# Patient Record
Sex: Female | Born: 1935 | Race: White | Hispanic: No | Marital: Married | Smoking: Never smoker
Health system: Southern US, Community
[De-identification: ages and names within clinical notes are randomized; demographics above are authoritative.]

## PROBLEM LIST (undated history)

## (undated) DIAGNOSIS — M810 Age-related osteoporosis without current pathological fracture: Secondary | ICD-10-CM

## (undated) DIAGNOSIS — K5100 Ulcerative (chronic) pancolitis without complications: Secondary | ICD-10-CM

## (undated) DIAGNOSIS — I10 Essential (primary) hypertension: Secondary | ICD-10-CM

## (undated) DIAGNOSIS — I498 Other specified cardiac arrhythmias: Secondary | ICD-10-CM

## (undated) DIAGNOSIS — J189 Pneumonia, unspecified organism: Secondary | ICD-10-CM

## (undated) HISTORY — PX: OTHER SURGICAL HISTORY: SHX169

---

## 2008-01-29 ENCOUNTER — Ambulatory Visit (HOSPITAL_COMMUNITY): Admission: RE | Admit: 2008-01-29 | Discharge: 2008-01-29 | Payer: Self-pay | Admitting: Urology

## 2015-07-23 ENCOUNTER — Emergency Department (HOSPITAL_BASED_OUTPATIENT_CLINIC_OR_DEPARTMENT_OTHER)
Admission: EM | Admit: 2015-07-23 | Discharge: 2015-07-23 | Disposition: A | Discharge status: Home / Self Care | Payer: Medicare Other | Attending: Emergency Medicine | Admitting: Emergency Medicine

## 2015-07-23 ENCOUNTER — Encounter (HOSPITAL_BASED_OUTPATIENT_CLINIC_OR_DEPARTMENT_OTHER): Payer: Self-pay | Admitting: *Deleted

## 2015-07-23 DIAGNOSIS — Z79899 Other long term (current) drug therapy: Secondary | ICD-10-CM | POA: Diagnosis not present

## 2015-07-23 DIAGNOSIS — E871 Hypo-osmolality and hyponatremia: Secondary | ICD-10-CM | POA: Insufficient documentation

## 2015-07-23 DIAGNOSIS — R112 Nausea with vomiting, unspecified: Secondary | ICD-10-CM | POA: Diagnosis not present

## 2015-07-23 DIAGNOSIS — E876 Hypokalemia: Secondary | ICD-10-CM | POA: Diagnosis not present

## 2015-07-23 DIAGNOSIS — Z8739 Personal history of other diseases of the musculoskeletal system and connective tissue: Secondary | ICD-10-CM | POA: Insufficient documentation

## 2015-07-23 DIAGNOSIS — Z7982 Long term (current) use of aspirin: Secondary | ICD-10-CM | POA: Insufficient documentation

## 2015-07-23 DIAGNOSIS — I10 Essential (primary) hypertension: Secondary | ICD-10-CM | POA: Insufficient documentation

## 2015-07-23 DIAGNOSIS — K5900 Constipation, unspecified: Secondary | ICD-10-CM | POA: Insufficient documentation

## 2015-07-23 DIAGNOSIS — R531 Weakness: Secondary | ICD-10-CM | POA: Diagnosis present

## 2015-07-23 DIAGNOSIS — Z8701 Personal history of pneumonia (recurrent): Secondary | ICD-10-CM | POA: Diagnosis not present

## 2015-07-23 DIAGNOSIS — Z792 Long term (current) use of antibiotics: Secondary | ICD-10-CM | POA: Diagnosis not present

## 2015-07-23 DIAGNOSIS — E860 Dehydration: Secondary | ICD-10-CM | POA: Insufficient documentation

## 2015-07-23 HISTORY — DX: Age-related osteoporosis without current pathological fracture: M81.0

## 2015-07-23 HISTORY — DX: Essential (primary) hypertension: I10

## 2015-07-23 HISTORY — DX: Ulcerative (chronic) pancolitis without complications: K51.00

## 2015-07-23 HISTORY — DX: Other specified cardiac arrhythmias: I49.8

## 2015-07-23 HISTORY — DX: Pneumonia, unspecified organism: J18.9

## 2015-07-23 LAB — URINE MICROSCOPIC-ADD ON: RBC / HPF: NONE SEEN RBC/hpf (ref 0–5)

## 2015-07-23 LAB — URINALYSIS, ROUTINE W REFLEX MICROSCOPIC
BILIRUBIN URINE: NEGATIVE
GLUCOSE, UA: NEGATIVE mg/dL
HGB URINE DIPSTICK: NEGATIVE
Ketones, ur: NEGATIVE mg/dL
Nitrite: NEGATIVE
Protein, ur: NEGATIVE mg/dL
SPECIFIC GRAVITY, URINE: 1.014 (ref 1.005–1.030)
pH: 6.5 (ref 5.0–8.0)

## 2015-07-23 LAB — MAGNESIUM: MAGNESIUM: 1.7 mg/dL (ref 1.7–2.4)

## 2015-07-23 MED ORDER — POTASSIUM CHLORIDE CRYS ER 20 MEQ PO TBCR
40.0000 meq | EXTENDED_RELEASE_TABLET | Freq: Once | ORAL | Status: AC
  Administered 2015-07-23: 40 meq via ORAL
  Filled 2015-07-23: qty 2

## 2015-07-23 MED ORDER — POTASSIUM CHLORIDE 10 MEQ/100ML IV SOLN
10.0000 meq | Freq: Once | INTRAVENOUS | Status: AC
  Administered 2015-07-23: 10 meq via INTRAVENOUS
  Filled 2015-07-23: qty 100

## 2015-07-23 NOTE — ED Notes (Signed)
Pt states she feels "much better" 

## 2015-07-23 NOTE — ED Notes (Signed)
[facility]

## 2015-07-23 NOTE — ED Provider Notes (Signed)
CSN: [id number]   Arrival date & time 07/23/15 1503  History  By signing my name below, I, Bethel BornBritney McCollum, attest that this documentation has been prepared under the direction and in the presence of Tilden FossaElizabeth Jaydn Moscato, MD. Electronically Signed: Bethel BornBritney McCollum, ED Scribe. 07/23/2015. 3:51 PM.  Chief Complaint  Patient presents with  . Weakness    HPI The history is provided by the patient and a relative. No language interpreter was used.   [facility], Leatrice JewelsDorothy Eplin is a 81 y.o. female with history of HTN, a-fib, kidney disease, and neurogenic bladder who presents to the Emergency Department with her son and daughter complaining of nausea and vomiting with onset 10 days ago. Pt states that she has been being treated for a UTI for the last 2 months with several rounds of abx. Most recently, she finished doxycycline 3 days ago after a 10 day treatment. Since starting the medication the pt had nausea and vomiting with every meal. Associated symptoms include dizziness, weakness (son states that the pt was unable to stand yesterday), and constipation that she associates with not eating much over the last several days. Today she was [facility] where her lab work was abnormal (K+ 2.9 & Na 129, WBC 11.3) and she was sent to the ED. Pt self-catheterizes and is on daily abx to prevent UTIs. Her urine has been cloudy and dark but not malodorous. She has not been passing gas. Pt denies fever, diarrhea, and abdominal bloating.   Past Medical History  Diagnosis Date  . Pneumonia   . Hypertension   . Ulcerative (chronic) enterocolitis (HCC)   . Other specified cardiac arrhythmias   . Osteoporosis     Past Surgical History  Procedure Laterality Date  . Urinary ostomy      No family history on file.  Social History  Substance Use Topics  . Smoking status: Never Smoker   . Smokeless tobacco: Not on file  . Alcohol  Use: No     Review of Systems  Constitutional: Negative for fever.  Gastrointestinal: Positive for nausea, vomiting and constipation. Negative for diarrhea.  Neurological: Positive for weakness.  All other systems reviewed and are negative.  Home Medications   Prior to Admission medications   Medication Sig Start Date End Date Taking? Authorizing Provider  amLODipine-benazepril (LOTREL) 10-20 MG capsule Take 1 capsule by mouth daily.   Yes Historical Provider, MD  aspirin 81 MG chewable tablet Chew by mouth daily.   Yes Historical Provider, MD  atenolol (TENORMIN) 100 MG tablet Take 100 mg by mouth daily.   Yes Historical Provider, MD  cetirizine (ZYRTEC) 5 MG tablet Take 5 mg by mouth daily.   Yes Historical Provider, MD  ciprofloxacin (CIPRO) 100 MG tablet Take 100 mg by mouth 2 (two) times daily.   Yes Historical Provider, MD  ezetimibe-simvastatin (VYTORIN) 10-20 MG tablet Take 1 tablet by mouth daily.   Yes Historical Provider, MD  RABEprazole (ACIPHEX) 20 MG tablet Take 20 mg by mouth daily.   Yes Historical Provider, MD    Allergies  Phenergan; Erythromycin-sulfisoxazole; and Sulfa antibiotics  Triage Vitals: BP 144/82 mmHg  Pulse 79  Temp(Src) 97.5 F (36.4 C) (Oral)  Resp 20  SpO2 97%  Physical Exam  Constitutional: She is oriented to person, place, and time. She appears well-developed and well-nourished.  HENT:  Head: Normocephalic and atraumatic.  Cardiovascular: Normal rate and regular rhythm.  No murmur heard. Pulmonary/Chest: Effort normal and breath sounds normal. No respiratory distress.  Abdominal: Soft. There is no tenderness. There is no rebound and no guarding.  Musculoskeletal: She exhibits no edema or tenderness.  Neurological: She is alert and oriented to person, place, and time.  Skin: Skin is warm and dry.  Psychiatric: She has a normal mood and affect. Her behavior is normal.  Nursing note and vitals reviewed.   ED Course   Procedures   DIAGNOSTIC STUDIES: Oxygen Saturation is 97% on RA, normal by my interpretation.    COORDINATION OF CARE: 3:46 PM Discussed treatment plan which includes lab work, potassium supplementation, and EKG with pt at bedside and pt agreed to plan.  Labs Reviewed  URINALYSIS, ROUTINE W REFLEX MICROSCOPIC (NOT AT [facility]) - Abnormal; Notable for the following:    Leukocytes, UA TRACE (*)    All other components within normal limits  URINE MICROSCOPIC-ADD ON - Abnormal; Notable for the following:    Squamous Epithelial / LPF 0-5 (*)    Bacteria, UA FEW (*)    All other components within normal limits  URINE CULTURE  MAGNESIUM    Imaging Review No results found.  I personally reviewed and evaluated these images and lab results as a part of my medical decision-making.   EKG Interpretation  Date/Time:  Saturday July 23 2015 15:13:56 EST Ventricular Rate:  77 PR Interval:  168 QRS Duration: 100 QT Interval:  448 QTC Calculation: 506 R Axis:   -3 Text Interpretation:  Sinus rhythm with Premature supraventricular complexes Nonspecific ST and T wave abnormality Abnormal ECG Confirmed by Lincoln Brigham [id number]) on 07/23/2015 3:33:18 PM    MDM   Final diagnoses:  Dehydration  Hypokalemia  Hyponatremia   Patient referred to the emergency department for hypokalemia, hyponatremia on outpatient labs. She had 1 L IV fluid bolus prior to ED arrival. Her labs demonstrated a sodium of 129, potassium of 2.9. Normal creatinine and BUN. Provided with oral and IV potassium replacement. UA is not consistent with UTI would not continue antibiotics this time. Patient is feeling improved and tolerating oral fluids. Recommend outpatient follow-up for repeat of her electrolytes.   Please note that patient did not have hypoxia in the emergency department and SPO2 6% was documented and air.  I personally performed the services described in this documentation, which was scribed in my presence.  The recorded information has been reviewed and is accurate.    Tilden Fossa, MD 07/24/15 [phone]

## 2015-07-23 NOTE — ED Notes (Signed)
Pt presents by POV [facility] today, with c/o weakness, also report by RN at clinic revealed low K and low Na in lab work.

## 2015-07-23 NOTE — ED Notes (Signed)
Pt HIGH FALL RISK, sr x 2 up, callbell within reach, family at side, bed in lowest position

## 2015-07-23 NOTE — ED Notes (Signed)
DC instructions reviewed with daughter and patient, discussed medications given at this ED visit, also stressed importance of keeping MD appoint on Monday for follow up, daughter states has confirmed appointment with MD they saw today at [facility] office

## 2015-07-23 NOTE — ED Notes (Signed)
Pt placed in exam room 2, on stretcher, sr x 2 up, family at side

## 2015-07-23 NOTE — ED Notes (Signed)
[facility], informed this RN pts vital signs, that 22g IV in place, that 1 unit of NS was given along with 4mg  Zofran. Labs drawn, placed unofficial report on chart.

## 2015-07-23 NOTE — ED Notes (Signed)
Reported that K+ 2.9 & Na 129, WBC 11.3

## 2015-07-23 NOTE — Discharge Instructions (Signed)
Dehydration, Adult Dehydration is a condition in which you do not have enough fluid or water in your body. It happens when you take in less fluid than you lose. Vital organs such as the kidneys, brain, and heart cannot function without a proper amount of fluids. Any loss of fluids from the body can cause dehydration.  Dehydration can range from mild to severe. This condition should be treated right away to help prevent it from becoming severe. CAUSES  This condition may be caused by:  Vomiting.  Diarrhea.  Excessive sweating, such as when exercising in hot or humid weather.  Not drinking enough fluid during strenuous exercise or during an illness.  Excessive urine output.  Fever.  Certain medicines. RISK FACTORS This condition is more likely to develop in:  People who are taking certain medicines that cause the body to lose excess fluid (diuretics).   People who have a chronic illness, such as diabetes, that may increase urination.  Older adults.   People who live at high altitudes.   People who participate in endurance sports.  SYMPTOMS  Mild Dehydration  Thirst.  Dry lips.  Slightly dry mouth.  Dry, warm skin. Moderate Dehydration  Very dry mouth.   Muscle cramps.   Dark urine and decreased urine production.   Decreased tear production.   Headache.   Light-headedness, especially when you stand up from a sitting position.  Severe Dehydration  Changes in skin.   Cold and clammy skin.   Skin does not spring back quickly when lightly pinched and released.   Changes in body fluids.   Extreme thirst.   No tears.   Not able to sweat when body temperature is high, such as in hot weather.   Minimal urine production.   Changes in vital signs.   Rapid, weak pulse (more than 100 beats per minute when you are sitting still).   Rapid breathing.   Low blood pressure.   Other changes.   Sunken eyes.   Cold hands and feet.    Confusion.  Lethargy and difficulty being awakened.  Fainting (syncope).   Short-term weight loss.   Unconsciousness. DIAGNOSIS  This condition may be diagnosed based on your symptoms. You may also have tests to determine how severe your dehydration is. These tests may include:   Urine tests.   Blood tests.  TREATMENT  Treatment for this condition depends on the severity. Mild or moderate dehydration can often be treated at home. Treatment should be started right away. Do not wait until dehydration becomes severe. Severe dehydration needs to be treated at the hospital. Treatment for Mild Dehydration  Drinking plenty of water to replace the fluid you have lost.   Replacing minerals in your blood (electrolytes) that you may have lost.  Treatment for Moderate Dehydration  Consuming oral rehydration solution (ORS). Treatment for Severe Dehydration  Receiving fluid through an IV tube.   Receiving electrolyte solution through a feeding tube that is passed through your nose and into your stomach (nasogastric tube or NG tube).  Correcting any abnormalities in electrolytes. HOME CARE INSTRUCTIONS   Drink enough fluid to keep your urine clear or pale yellow.   Drink water or fluid slowly by taking small sips. You can also try sucking on ice cubes.  Have food or beverages that contain electrolytes. Examples include bananas and sports drinks.  Take over-the-counter and prescription medicines only as told by your health care provider.   Prepare ORS according to the manufacturer's instructions. Take sips   of ORS every 5 minutes until your urine returns to normal.  If you have vomiting or diarrhea, continue to try to drink water, ORS, or both.   If you have diarrhea, avoid:   Beverages that contain caffeine.   Fruit juice.   Milk.   Carbonated soft drinks.  Do not take salt tablets. This can lead to the condition of having too much sodium in your body  (hypernatremia).  SEEK MEDICAL CARE IF:  You cannot eat or drink without vomiting.  You have had moderate diarrhea during a period of more than 24 hours.  You have a fever. SEEK IMMEDIATE MEDICAL CARE IF:   You have extreme thirst.  You have severe diarrhea.  You have not urinated in 6-8 hours, or you have urinated only a small amount of very dark urine.  You have shriveled skin.  You are dizzy, confused, or both.   This information is not intended to replace advice given to you by your health care provider. Make sure you discuss any questions you have with your health care provider.   Document Released: 08/20/2005 Document Revised: 05/11/2015 Document Reviewed: 01/05/2015 Elsevier Interactive Patient Education 2016 Elsevier Inc.   Hypokalemia Hypokalemia means that the amount of potassium in the blood is lower than normal.Potassium is a chemical, called an electrolyte, that helps regulate the amount of fluid in the body. It also stimulates muscle contraction and helps nerves function properly.Most of the body's potassium is inside of cells, and only a very small amount is in the blood. Because the amount in the blood is so small, minor changes can be life-threatening. CAUSES  Antibiotics.  Diarrhea or vomiting.  Using laxatives too much, which can cause diarrhea.  Chronic kidney disease.  Water pills (diuretics).  Eating disorders (bulimia).  Low magnesium level.  Sweating a lot. SIGNS AND SYMPTOMS  Weakness.  Constipation.  Fatigue.  Muscle cramps.  Mental confusion.  Skipped heartbeats or irregular heartbeat (palpitations).  Tingling or numbness. DIAGNOSIS  Your health care provider can diagnose hypokalemia with blood tests. In addition to checking your potassium level, your health care provider may also check other lab tests. TREATMENT Hypokalemia can be treated with potassium supplements taken by mouth or adjustments in your current medicines.  If your potassium level is very low, you may need to get potassium through a vein (IV) and be monitored in the hospital. A diet high in potassium is also helpful. Foods high in potassium are:  Nuts, such as peanuts and pistachios.  Seeds, such as sunflower seeds and pumpkin seeds.  Peas, lentils, and lima beans.  Whole grain and bran cereals and breads.  Fresh fruit and vegetables, such as apricots, avocado, bananas, cantaloupe, kiwi, oranges, tomatoes, asparagus, and potatoes.  Orange and tomato juices.  Red meats.  Fruit yogurt. HOME CARE INSTRUCTIONS  Take all medicines as prescribed by your health care provider.  Maintain a healthy diet by including nutritious food, such as fruits, vegetables, nuts, whole grains, and lean meats.  If you are taking a laxative, be sure to follow the directions on the label. SEEK MEDICAL CARE IF:  Your weakness gets worse.  You feel your heart pounding or racing.  You are vomiting or having diarrhea.  You are diabetic and having trouble keeping your blood glucose in the normal range. SEEK IMMEDIATE MEDICAL CARE IF:  You have chest pain, shortness of breath, or dizziness.  You are vomiting or having diarrhea for more than 2 days.  You faint. MAKE   SURE YOU:   Understand these instructions.  Will watch your condition.  Will get help right away if you are not doing well or get worse.   This information is not intended to replace advice given to you by your health care provider. Make sure you discuss any questions you have with your health care provider.   Document Released: 08/20/2005 Document Revised: 09/10/2014 Document Reviewed: 02/20/2013 Elsevier Interactive Patient Education 2016 Elsevier Inc.  

## 2015-07-23 NOTE — ED Notes (Signed)
Placed on cont cardiac monitor with cont POX monitoring and NBP monitoring

## 2015-07-25 LAB — URINE CULTURE: Culture: NO GROWTH

## 2017-07-03 ENCOUNTER — Emergency Department (HOSPITAL_COMMUNITY): Payer: Medicare Other | Admitting: Anesthesiology

## 2017-07-03 ENCOUNTER — Encounter (HOSPITAL_COMMUNITY): Payer: Self-pay

## 2017-07-03 ENCOUNTER — Inpatient Hospital Stay (HOSPITAL_COMMUNITY)
Admission: EM | Admit: 2017-07-03 | Discharge: 2017-07-08 | DRG: 351 | Disposition: A | Discharge status: Home Health | Payer: Medicare Other | Attending: Surgery | Admitting: Surgery

## 2017-07-03 ENCOUNTER — Encounter (HOSPITAL_COMMUNITY): Admission: EM | Disposition: A | Discharge status: Home Health | Payer: Self-pay | Source: Home / Self Care

## 2017-07-03 ENCOUNTER — Inpatient Hospital Stay (HOSPITAL_COMMUNITY): Payer: Medicare Other

## 2017-07-03 ENCOUNTER — Emergency Department (HOSPITAL_COMMUNITY): Payer: Medicare Other

## 2017-07-03 DIAGNOSIS — K5100 Ulcerative (chronic) pancolitis without complications: Secondary | ICD-10-CM | POA: Diagnosis not present

## 2017-07-03 DIAGNOSIS — R7989 Other specified abnormal findings of blood chemistry: Secondary | ICD-10-CM | POA: Diagnosis not present

## 2017-07-03 DIAGNOSIS — Z9889 Other specified postprocedural states: Secondary | ICD-10-CM | POA: Diagnosis not present

## 2017-07-03 DIAGNOSIS — Z881 Allergy status to other antibiotic agents status: Secondary | ICD-10-CM | POA: Diagnosis not present

## 2017-07-03 DIAGNOSIS — Z8719 Personal history of other diseases of the digestive system: Secondary | ICD-10-CM

## 2017-07-03 DIAGNOSIS — K4130 Unilateral femoral hernia, with obstruction, without gangrene, not specified as recurrent: Secondary | ICD-10-CM

## 2017-07-03 DIAGNOSIS — E860 Dehydration: Secondary | ICD-10-CM | POA: Diagnosis present

## 2017-07-03 DIAGNOSIS — I959 Hypotension, unspecified: Secondary | ICD-10-CM | POA: Diagnosis not present

## 2017-07-03 DIAGNOSIS — R197 Diarrhea, unspecified: Secondary | ICD-10-CM

## 2017-07-03 DIAGNOSIS — I129 Hypertensive chronic kidney disease with stage 1 through stage 4 chronic kidney disease, or unspecified chronic kidney disease: Secondary | ICD-10-CM | POA: Diagnosis not present

## 2017-07-03 DIAGNOSIS — K460 Unspecified abdominal hernia with obstruction, without gangrene: Secondary | ICD-10-CM | POA: Diagnosis present

## 2017-07-03 DIAGNOSIS — R112 Nausea with vomiting, unspecified: Secondary | ICD-10-CM

## 2017-07-03 DIAGNOSIS — N319 Neuromuscular dysfunction of bladder, unspecified: Secondary | ICD-10-CM | POA: Diagnosis present

## 2017-07-03 DIAGNOSIS — N179 Acute kidney failure, unspecified: Secondary | ICD-10-CM

## 2017-07-03 DIAGNOSIS — E876 Hypokalemia: Secondary | ICD-10-CM

## 2017-07-03 DIAGNOSIS — I480 Paroxysmal atrial fibrillation: Secondary | ICD-10-CM | POA: Diagnosis not present

## 2017-07-03 DIAGNOSIS — Z882 Allergy status to sulfonamides status: Secondary | ICD-10-CM

## 2017-07-03 DIAGNOSIS — E871 Hypo-osmolality and hyponatremia: Secondary | ICD-10-CM | POA: Diagnosis not present

## 2017-07-03 DIAGNOSIS — I2510 Atherosclerotic heart disease of native coronary artery without angina pectoris: Secondary | ICD-10-CM | POA: Diagnosis not present

## 2017-07-03 DIAGNOSIS — R7303 Prediabetes: Secondary | ICD-10-CM | POA: Diagnosis present

## 2017-07-03 DIAGNOSIS — Z87440 Personal history of urinary (tract) infections: Secondary | ICD-10-CM

## 2017-07-03 DIAGNOSIS — Z9181 History of falling: Secondary | ICD-10-CM

## 2017-07-03 DIAGNOSIS — M810 Age-related osteoporosis without current pathological fracture: Secondary | ICD-10-CM | POA: Diagnosis not present

## 2017-07-03 DIAGNOSIS — E878 Other disorders of electrolyte and fluid balance, not elsewhere classified: Secondary | ICD-10-CM | POA: Diagnosis present

## 2017-07-03 DIAGNOSIS — Z888 Allergy status to other drugs, medicaments and biological substances status: Secondary | ICD-10-CM

## 2017-07-03 DIAGNOSIS — Z87891 Personal history of nicotine dependence: Secondary | ICD-10-CM

## 2017-07-03 DIAGNOSIS — A0472 Enterocolitis due to Clostridium difficile, not specified as recurrent: Secondary | ICD-10-CM | POA: Diagnosis present

## 2017-07-03 DIAGNOSIS — D649 Anemia, unspecified: Secondary | ICD-10-CM | POA: Diagnosis not present

## 2017-07-03 DIAGNOSIS — E785 Hyperlipidemia, unspecified: Secondary | ICD-10-CM | POA: Diagnosis present

## 2017-07-03 DIAGNOSIS — N184 Chronic kidney disease, stage 4 (severe): Secondary | ICD-10-CM

## 2017-07-03 HISTORY — DX: Unilateral femoral hernia, with obstruction, without gangrene, not specified as recurrent: K41.30

## 2017-07-03 HISTORY — PX: FEMORAL HERNIA REPAIR: SHX632

## 2017-07-03 LAB — URINALYSIS, ROUTINE W REFLEX MICROSCOPIC
BILIRUBIN URINE: NEGATIVE
GLUCOSE, UA: NEGATIVE mg/dL
Hgb urine dipstick: NEGATIVE
KETONES UR: 5 mg/dL — AB
NITRITE: NEGATIVE
Protein, ur: 100 mg/dL — AB
Specific Gravity, Urine: 1.017 (ref 1.005–1.030)
pH: 5 (ref 5.0–8.0)

## 2017-07-03 LAB — CBC WITH DIFFERENTIAL/PLATELET
BASOS PCT: 0 %
Basophils Absolute: 0 10*3/uL (ref 0.0–0.1)
EOS ABS: 0 10*3/uL (ref 0.0–0.7)
Eosinophils Relative: 0 %
HEMATOCRIT: 40.6 % (ref 36.0–46.0)
Hemoglobin: 14.3 g/dL (ref 12.0–15.0)
Lymphocytes Relative: 13 %
Lymphs Abs: 1 10*3/uL (ref 0.7–4.0)
MCH: 31 pg (ref 26.0–34.0)
MCHC: 35.2 g/dL (ref 30.0–36.0)
MCV: 87.9 fL (ref 78.0–100.0)
MONO ABS: 0.6 10*3/uL (ref 0.1–1.0)
MONOS PCT: 8 %
Neutro Abs: 6.3 10*3/uL (ref 1.7–7.7)
Neutrophils Relative %: 79 %
Platelets: 292 10*3/uL (ref 150–400)
RBC: 4.62 MIL/uL (ref 3.87–5.11)
RDW: 14 % (ref 11.5–15.5)
WBC: 7.9 10*3/uL (ref 4.0–10.5)

## 2017-07-03 LAB — COMPREHENSIVE METABOLIC PANEL
ALBUMIN: 4.3 g/dL (ref 3.5–5.0)
ALK PHOS: 59 U/L (ref 38–126)
ALT: 21 U/L (ref 14–54)
AST: 40 U/L (ref 15–41)
Anion gap: 18 — ABNORMAL HIGH (ref 5–15)
BUN: 25 mg/dL — ABNORMAL HIGH (ref 6–20)
CALCIUM: 11 mg/dL — AB (ref 8.9–10.3)
CO2: 36 mmol/L — AB (ref 22–32)
CREATININE: 1.99 mg/dL — AB (ref 0.44–1.00)
Chloride: 75 mmol/L — ABNORMAL LOW (ref 101–111)
GFR calc Af Amer: 24 mL/min — ABNORMAL LOW (ref >60)
GFR calc non Af Amer: 21 mL/min — ABNORMAL LOW (ref >60)
GLUCOSE: 127 mg/dL — AB (ref 65–99)
Potassium: 2.9 mmol/L — ABNORMAL LOW (ref 3.5–5.1)
SODIUM: 129 mmol/L — AB (ref 135–145)
Total Bilirubin: 1.3 mg/dL — ABNORMAL HIGH (ref 0.3–1.2)
Total Protein: 8.4 g/dL — ABNORMAL HIGH (ref 6.5–8.1)

## 2017-07-03 LAB — I-STAT CG4 LACTIC ACID, ED
Lactic Acid, Venous: 2.36 mmol/L (ref 0.5–1.9)
Lactic Acid, Venous: 1.67 mmol/L (ref 0.5–1.9)

## 2017-07-03 LAB — MRSA PCR SCREENING: MRSA BY PCR: POSITIVE — AB

## 2017-07-03 SURGERY — HERNIA REPAIR FEMORAL
Anesthesia: General | Site: Groin | Laterality: Left

## 2017-07-03 MED ORDER — MESALAMINE 400 MG PO CPDR
800.0000 mg | DELAYED_RELEASE_CAPSULE | Freq: Three times a day (TID) | ORAL | Status: DC
  Administered 2017-07-03 – 2017-07-08 (×11): 800 mg via ORAL
  Filled 2017-07-03 (×15): qty 2

## 2017-07-03 MED ORDER — CHLORHEXIDINE GLUCONATE CLOTH 2 % EX PADS
6.0000 | MEDICATED_PAD | Freq: Once | CUTANEOUS | Status: AC
  Administered 2017-07-03: 6 via TOPICAL

## 2017-07-03 MED ORDER — LIDOCAINE HCL 2 % EX GEL
1.0000 "application " | Freq: Once | CUTANEOUS | Status: AC
  Administered 2017-07-03: 1 via TOPICAL
  Filled 2017-07-03: qty 20

## 2017-07-03 MED ORDER — DEXAMETHASONE SODIUM PHOSPHATE 4 MG/ML IJ SOLN
INTRAMUSCULAR | Status: DC | PRN
  Administered 2017-07-03: 10 mg via INTRAVENOUS

## 2017-07-03 MED ORDER — BUPIVACAINE-EPINEPHRINE 0.5% -1:200000 IJ SOLN
INTRAMUSCULAR | Status: DC | PRN
  Administered 2017-07-03: 20 mL

## 2017-07-03 MED ORDER — LIDOCAINE HCL (CARDIAC) 20 MG/ML IV SOLN
INTRAVENOUS | Status: DC | PRN
  Administered 2017-07-03: 80 mg via INTRAVENOUS

## 2017-07-03 MED ORDER — MORPHINE SULFATE (PF) 4 MG/ML IV SOLN: 1.0000 mg | INTRAVENOUS | Status: DC | PRN

## 2017-07-03 MED ORDER — METRONIDAZOLE 500 MG PO TABS
500.0000 mg | ORAL_TABLET | Freq: Three times a day (TID) | ORAL | Status: DC
  Administered 2017-07-03 – 2017-07-08 (×11): 500 mg via ORAL
  Filled 2017-07-03 (×14): qty 1

## 2017-07-03 MED ORDER — KCL IN DEXTROSE-NACL 40-5-0.9 MEQ/L-%-% IV SOLN
INTRAVENOUS | Status: DC
  Administered 2017-07-03 – 2017-07-05 (×5): via INTRAVENOUS
  Filled 2017-07-03 (×7): qty 1000

## 2017-07-03 MED ORDER — PHENYLEPHRINE HCL 10 MG/ML IJ SOLN
INTRAMUSCULAR | Status: DC | PRN
  Administered 2017-07-03 (×13): 80 ug via INTRAVENOUS

## 2017-07-03 MED ORDER — SODIUM CHLORIDE 0.9 % IV BOLUS (SEPSIS)
1000.0000 mL | Freq: Once | INTRAVENOUS | Status: AC
  Administered 2017-07-03: 1000 mL via INTRAVENOUS

## 2017-07-03 MED ORDER — FENTANYL CITRATE (PF) 100 MCG/2ML IJ SOLN
25.0000 ug | Freq: Once | INTRAMUSCULAR | Status: AC
  Administered 2017-07-03: 25 ug via INTRAVENOUS
  Filled 2017-07-03: qty 2

## 2017-07-03 MED ORDER — CEFOTETAN DISODIUM-DEXTROSE 2-2.08 GM-%(50ML) IV SOLR
2.0000 g | INTRAVENOUS | Status: DC
  Filled 2017-07-03: qty 50

## 2017-07-03 MED ORDER — ONDANSETRON 4 MG PO TBDP: 4.0000 mg | ORAL_TABLET | Freq: Four times a day (QID) | ORAL | Status: DC | PRN

## 2017-07-03 MED ORDER — ATENOLOL 50 MG PO TABS
100.0000 mg | ORAL_TABLET | Freq: Two times a day (BID) | ORAL | Status: DC
  Administered 2017-07-03 – 2017-07-08 (×9): 100 mg via ORAL
  Filled 2017-07-03 (×9): qty 2

## 2017-07-03 MED ORDER — LACTATED RINGERS IV SOLN
INTRAVENOUS | Status: DC
  Administered 2017-07-03 (×2): via INTRAVENOUS

## 2017-07-03 MED ORDER — PROPOFOL 500 MG/50ML IV EMUL
INTRAVENOUS | Status: DC | PRN
  Administered 2017-07-03: 20 ug/kg/min via INTRAVENOUS

## 2017-07-03 MED ORDER — BUPIVACAINE HCL (PF) 0.5 % IJ SOLN
INTRAMUSCULAR | Status: AC
  Filled 2017-07-03: qty 30

## 2017-07-03 MED ORDER — ONDANSETRON HCL 4 MG/2ML IJ SOLN: 4.0000 mg | Freq: Four times a day (QID) | INTRAMUSCULAR | Status: DC | PRN

## 2017-07-03 MED ORDER — IOPAMIDOL (ISOVUE-300) INJECTION 61%
INTRAVENOUS | Status: AC
  Filled 2017-07-03: qty 30

## 2017-07-03 MED ORDER — 0.9 % SODIUM CHLORIDE (POUR BTL) OPTIME
TOPICAL | Status: DC | PRN
  Administered 2017-07-03: 1000 mL

## 2017-07-03 MED ORDER — ALBUMIN HUMAN 5 % IV SOLN
INTRAVENOUS | Status: DC | PRN
  Administered 2017-07-03: 17:00:00 via INTRAVENOUS

## 2017-07-03 MED ORDER — FENTANYL CITRATE (PF) 100 MCG/2ML IJ SOLN
INTRAMUSCULAR | Status: DC | PRN
  Administered 2017-07-03: 50 ug via INTRAVENOUS
  Administered 2017-07-03: 100 ug via INTRAVENOUS

## 2017-07-03 MED ORDER — ONDANSETRON HCL 4 MG/2ML IJ SOLN
4.0000 mg | Freq: Once | INTRAMUSCULAR | Status: AC
  Administered 2017-07-03: 4 mg via INTRAVENOUS
  Filled 2017-07-03: qty 2

## 2017-07-03 MED ORDER — METOCLOPRAMIDE HCL 5 MG/ML IJ SOLN
10.0000 mg | Freq: Once | INTRAMUSCULAR | Status: AC
  Administered 2017-07-03: 10 mg via INTRAVENOUS
  Filled 2017-07-03: qty 2

## 2017-07-03 MED ORDER — AMLODIPINE BESYLATE 5 MG PO TABS
5.0000 mg | ORAL_TABLET | Freq: Every day | ORAL | Status: DC
Start: 2017-07-04 — End: 2017-07-08
  Administered 2017-07-05 – 2017-07-08 (×4): 5 mg via ORAL
  Filled 2017-07-03 (×4): qty 1

## 2017-07-03 MED ORDER — FENTANYL CITRATE (PF) 250 MCG/5ML IJ SOLN
INTRAMUSCULAR | Status: AC
  Filled 2017-07-03: qty 5

## 2017-07-03 MED ORDER — ONDANSETRON HCL 4 MG/2ML IJ SOLN
INTRAMUSCULAR | Status: DC | PRN
  Administered 2017-07-03: 4 mg via INTRAVENOUS

## 2017-07-03 MED ORDER — SUCCINYLCHOLINE CHLORIDE 20 MG/ML IJ SOLN
INTRAMUSCULAR | Status: DC | PRN
  Administered 2017-07-03: 120 mg via INTRAVENOUS

## 2017-07-03 MED ORDER — CEFOTETAN DISODIUM-DEXTROSE 2-2.08 GM-%(50ML) IV SOLR
2.0000 g | INTRAVENOUS | Status: AC
  Administered 2017-07-03: 2 g via INTRAVENOUS

## 2017-07-03 MED ORDER — ENOXAPARIN SODIUM 30 MG/0.3ML ~~LOC~~ SOLN
30.0000 mg | SUBCUTANEOUS | Status: DC
  Administered 2017-07-04 – 2017-07-05 (×2): 30 mg via SUBCUTANEOUS
  Filled 2017-07-03 (×2): qty 0.3

## 2017-07-03 MED ORDER — PROPOFOL 10 MG/ML IV BOLUS
INTRAVENOUS | Status: AC
  Filled 2017-07-03: qty 20

## 2017-07-03 MED ORDER — PROPOFOL 10 MG/ML IV BOLUS
INTRAVENOUS | Status: DC | PRN
  Administered 2017-07-03: 100 mg via INTRAVENOUS

## 2017-07-03 MED ORDER — POTASSIUM CHLORIDE 10 MEQ/100ML IV SOLN
10.0000 meq | Freq: Once | INTRAVENOUS | Status: AC
  Administered 2017-07-03: 10 meq via INTRAVENOUS
  Filled 2017-07-03: qty 100

## 2017-07-03 SURGICAL SUPPLY — 42 items
BLADE CLIPPER SURG (BLADE) IMPLANT
CANISTER SUCTION 2500CC (MISCELLANEOUS) ×3 IMPLANT
CHLORAPREP W/TINT 26ML (MISCELLANEOUS) ×3 IMPLANT
COVER SURGICAL LIGHT HANDLE (MISCELLANEOUS) ×3 IMPLANT
DERMABOND ADVANCED (GAUZE/BANDAGES/DRESSINGS) ×2
DERMABOND ADVANCED .7 DNX12 (GAUZE/BANDAGES/DRESSINGS) ×1 IMPLANT
DRAIN PENROSE 1/2X12 LTX STRL (WOUND CARE) IMPLANT
DRAPE LAPAROTOMY TRNSV 102X78 (DRAPE) ×3 IMPLANT
DRAPE UTILITY XL STRL (DRAPES) ×3 IMPLANT
DRSG TEGADERM 4X4.75 (GAUZE/BANDAGES/DRESSINGS) IMPLANT
DRSG TELFA 3X8 NADH (GAUZE/BANDAGES/DRESSINGS) IMPLANT
ELECT CAUTERY BLADE 6.4 (BLADE) ×3 IMPLANT
ELECT REM PT RETURN 9FT ADLT (ELECTROSURGICAL) ×3
ELECTRODE REM PT RTRN 9FT ADLT (ELECTROSURGICAL) ×1 IMPLANT
GLOVE BIO SURGEON STRL SZ7 (GLOVE) ×6 IMPLANT
GLOVE INDICATOR 7.0 STRL GRN (GLOVE) ×6 IMPLANT
GLOVE SURG SIGNA 7.5 PF LTX (GLOVE) ×3 IMPLANT
GOWN STRL REUS W/ TWL LRG LVL3 (GOWN DISPOSABLE) ×2 IMPLANT
GOWN STRL REUS W/ TWL XL LVL3 (GOWN DISPOSABLE) ×1 IMPLANT
GOWN STRL REUS W/TWL LRG LVL3 (GOWN DISPOSABLE) ×4
GOWN STRL REUS W/TWL XL LVL3 (GOWN DISPOSABLE) ×2
KIT BASIN OR (CUSTOM PROCEDURE TRAY) ×3 IMPLANT
KIT ROOM TURNOVER OR (KITS) ×3 IMPLANT
NEEDLE HYPO 25GX1X1/2 BEV (NEEDLE) ×6 IMPLANT
NS IRRIG 1000ML POUR BTL (IV SOLUTION) ×3 IMPLANT
PACK SURGICAL SETUP 50X90 (CUSTOM PROCEDURE TRAY) ×3 IMPLANT
PAD ARMBOARD 7.5X6 YLW CONV (MISCELLANEOUS) ×6 IMPLANT
PENCIL BUTTON HOLSTER BLD 10FT (ELECTRODE) ×3 IMPLANT
SPECIMEN JAR SMALL (MISCELLANEOUS) IMPLANT
SPONGE LAP 18X18 X RAY DECT (DISPOSABLE) ×3 IMPLANT
SUT MON AB 4-0 PC3 18 (SUTURE) ×3 IMPLANT
SUT SILK 2 0 SH (SUTURE) ×3 IMPLANT
SUT SILK 3 0 SH CR/8 (SUTURE) ×3 IMPLANT
SUT VIC AB 2-0 CT1 27 (SUTURE) ×4
SUT VIC AB 2-0 CT1 TAPERPNT 27 (SUTURE) ×2 IMPLANT
SUT VIC AB 3-0 CT1 27 (SUTURE) ×2
SUT VIC AB 3-0 CT1 TAPERPNT 27 (SUTURE) ×1 IMPLANT
SYR CONTROL 10ML LL (SYRINGE) ×3 IMPLANT
TOWEL OR 17X24 6PK STRL BLUE (TOWEL DISPOSABLE) ×3 IMPLANT
TOWEL OR 17X26 10 PK STRL BLUE (TOWEL DISPOSABLE) IMPLANT
TRAY FOLEY CATH SILVER 16FR (SET/KITS/TRAYS/PACK) ×3 IMPLANT
WATER STERILE IRR 1000ML POUR (IV SOLUTION) IMPLANT

## 2017-07-03 NOTE — Progress Notes (Signed)
Patient ID: Renee Fritz, female   DOB: 01-19-1926, 83 y.o.   MRN: [id number]   This is a patient with an incarcerated left femoral hernia.  It is creating a bowel obstruction.  Emergent left femoral hernia repair is recommended.  I have discussed this with the patient and her daughter.  I discussed the risk of surgery which includes but is not limited to bleeding, infection, injury to surrounding structures, the need for a bowel resection if the small bowel is compromised, chronic pain, hernia recurrence, use of mesh, cardiopulmonary issues, etc.  They understand and agree to proceed with surgery

## 2017-07-03 NOTE — ED Triage Notes (Signed)
Pt presents for evaluation of ongoing N/V x 3 days. Pt reports hx of ulcerative colitis and started on vancomycin. States she believes it may be causing her vomiting. States feels dehydrated. Pt reports mechanical fall onto buttocks this AM. Denies LOC/Dizziness/Lightheadedness.

## 2017-07-03 NOTE — Transfer of Care (Signed)
Immediate Anesthesia Transfer of Care Note  Patient: Renee JewelsDorothy Sabree  Procedure(s) Performed: HERNIA REPAIR FEMORAL (Left Groin)  Patient Location: PACU  Anesthesia Type:General  Level of Consciousness: awake and alert   Airway & Oxygen Therapy: Patient Spontanous Breathing and Patient connected to nasal cannula oxygen  Post-op Assessment: Report given to RN, Post -op Vital signs reviewed and stable and Patient moving all extremities  Post vital signs: Reviewed and stable  Last Vitals:  Vitals:   07/03/17 1445 07/03/17 1716  BP: 126/75 (!) 114/54  Pulse: 82 78  Resp: 19 13  Temp:  (!) 36.3 C  SpO2: 98% 100%    Last Pain:  Vitals:   07/03/17 1716  PainSc: 0-No pain         Complications: No apparent anesthesia complications

## 2017-07-03 NOTE — ED Notes (Signed)
On way to CT 

## 2017-07-03 NOTE — Anesthesia Procedure Notes (Signed)
Procedure Name: Intubation Date/Time: 07/03/2017 4:07 PM Performed by: Reine JustFLOWERS, Kalenna Millett T Pre-anesthesia Checklist: Patient identified, Emergency Drugs available, Suction available and Patient being monitored Patient Re-evaluated:Patient Re-evaluated prior to induction Oxygen Delivery Method: Circle system utilized Preoxygenation: Pre-oxygenation with 100% oxygen Induction Type: IV induction Ventilation: Mask ventilation without difficulty Laryngoscope Size: Miller and 2 Grade View: Grade I Tube type: Oral Tube size: 7.5 mm Number of attempts: 1 Airway Equipment and Method: Patient positioned with wedge pillow and Stylet Placement Confirmation: ETT inserted through vocal cords under direct vision,  positive ETCO2 and breath sounds checked- equal and bilateral Secured at: 21 cm Tube secured with: Tape Dental Injury: Teeth and Oropharynx as per pre-operative assessment

## 2017-07-03 NOTE — ED Notes (Signed)
Per Dr. Magnus IvanBlackman, NG tube will be placed in the OR when patient is asleep.

## 2017-07-03 NOTE — Op Note (Signed)
HERNIA REPAIR FEMORAL  Procedure Note  Renee JewelsDorothy Fritz 07/03/2017   Pre-op Diagnosis: incarcerated left hernia     Post-op Diagnosis: same  Procedure(s): HERNIA REPAIR FEMORAL  Surgeon(s): Abigail MiyamotoBlackman, Jenaya Saar, MD  Anesthesia: General  Staff:  Circulator: Jasper RilingHooper, Barbara, RN Scrub Person: Carmela RimaLeggio, Rebecca K  Estimated Blood Loss: Minimal               Indications: This is an 83 year old female with multiple medical problems who was recently being treated for possible C. difficile colitis.  She presented with nausea vomiting and abdominal pain over 3 days.  She had a CT scan today which showed an incarcerated left femoral hernia containing small bowel with a small bowel obstruction.  The decision was made to proceed emergently to the operating room  Findings: The patient was found to have an incarcerated left femoral hernia containing small intestines.  There were mild ischemic changes and bruising of the small bowel but no gross infarction so I elected not to resect the bowel.  The femoral hernia was repaired primarily  Procedure: The patient was brought to the operating room and identified as the correct patient.  She was placed supine on the operating room table and general anesthesia was induced.  Her abdomen was then prepped and draped in the usual sterile fashion.  I made a longitudinal incision in the patient's left lower quadrant with a scalpel.  I took this down through Scarpa's fascia with electrocautery.  The hernial was below the inguinal ligament.  I opened up the external oblique fascia above the inguinal ligament and then dissected toward the femoral hernia.  I ended up having to open up the inguinal ligament and then opened up the hernia sac.  There was visible small bowel in the hernia sac.  There were some ischemic changes in the wall of the bowel but no gross infarction.  I have eviscerated small bowel proximal distal to this area.  I elected to imbricate the small area of the  antimesenteric border of the small bowel with several interrupted silk sutures.  There was good peristalsis of the bowel.  The mesentery looked intact.  I then placed the bowel back into the abdominal cavity.  I then placed a 2-0 silk pursestring suture around the base of the hernia sac at the femoral opening and excised the redundant sac.  I then repaired the inguinal ligament with several mattress 2-0 Vicryl sutures and used several interrupted 2-0 Vicryl sutures to close up the femoral canal repairing the defect.  The femoral vessels and nerve were closely visualized and were not involved in the repair.  At this point I then closed the external oblique fascia as well with interrupted Vicryl sutures.  I then anesthetized the surrounding tissue with Marcaine.  I closed Scarpa's fascia with interrupted 3-0 Vicryl sutures and closed the skin with a running 4-0 Monocryl.  Dermabond was then applied.  The patient appeared to tolerate the procedure well.  All the counts were correct at the end of the procedure.  The patient was then taken in a stable condition to the recovery room.          Renee Fritz A   Date: 07/03/2017  Time: 5:09 PM

## 2017-07-03 NOTE — Progress Notes (Signed)
Patient stated she last took Atenolol yesterday. Anesthesiologist at bedside and stated to hold off on med at this time due to nausea/vomiting.

## 2017-07-03 NOTE — Plan of Care (Signed)
Problem: Education: Goal: Knowledge of [facility] [place] [facility] Education information/materials will improve Outcome: Progressing Discussed plan of care for the evening and NG tube with some teach back displayed

## 2017-07-03 NOTE — H&P (Addendum)
History and Physical  Renee Fritz ZOX:[id number] DOB: 26-Jul-1926 DOA: 07/03/2017  Referring physician: Dr. Magnus Ivan PCP: Lester ., MD  Outpatient Specialists: Surgery Dr. Magnus Ivan Patient coming from: Home  Chief Complaint: Post incarderated hernia repair, POD#0  HPI: Renee Fritz is a 83 y.o. female with medical history significant for ulcerative colitis, HTN, CAD, recently treated c-difficile colitis, left femorl hernia with incarcerated small bowel, POD#0 post  Incarcerated left hernia femoral repair. The patient reports intractable abdominal pain, nausea vomiting and diarrhea of 3 day duration. The medicine was asked to evaluate and admit the patient pos surgery due to her multiple comorbidities.  ED Course: Upon presentation to the ED, CT abd/pelvis w/o contrast revealed:1. Left femoral hernia containing incarcerated small bowel with resultant high-grade obstruction. Abnormal chemistry panel with hyponatremia, hypokalemia, anion gap of 18, and AKI on CKD stage 4. General surgery consulted and patient was taken to the OR for femoral hernia repair, POD#0   Review of Systems: Pt denies any chest pain, dyspnea or palpitations.  Pain is well controlled on current pain management. Review of systems are otherwise negative.   Past Medical History:  Diagnosis Date  . Hypertension   . Osteoporosis   . Other specified cardiac arrhythmias   . Pneumonia   . Ulcerative (chronic) enterocolitis (HCC)    Past Surgical History:  Procedure Laterality Date  . urinary ostomy      Social History:  reports that she has never smoked. She does not have any smokeless tobacco history on file. She reports that she does not drink alcohol or use drugs.   Allergies  Allergen Reactions  . Phenergan [Promethazine Hcl] Other (See Comments)    Feels like bugs crawling all over  . Vancomycin Diarrhea and Other (See Comments)    Stomach pains  . Erythromycin-Sulfisoxazole Rash  . Sulfa  Antibiotics Rash    Family History  Problem Relation Age of Onset  . Family history unknown: Yes    Prior to Admission medications   Medication Sig Start Date End Date Taking? Authorizing Provider  amLODipine (NORVASC) 5 MG tablet Take 5 mg by mouth daily.   Yes [provider]  aspirin 81 MG chewable tablet Chew by mouth daily.   Yes [provider]  atenolol (TENORMIN) 100 MG tablet Take 100 mg by mouth 2 (two) times daily.    Yes [provider]  B-Complex-C-Biotin-Minerals-FA (FOLBEE PLUS CZ) 5 MG TABS Take 1 tablet by mouth daily.   Yes [provider]  busPIRone (BUSPAR) 5 MG tablet Take 5 mg by mouth 2 (two) times daily.   Yes [provider]  ciprofloxacin (CIPRO) 100 MG tablet Take 100 mg by mouth 2 (two) times daily.   Yes [provider]  ezetimibe-simvastatin (VYTORIN) 10-20 MG tablet Take 1 tablet by mouth daily.   Yes [provider]  hydrochlorothiazide (HYDRODIURIL) 25 MG tablet Take 25 mg by mouth daily.   Yes [provider]  hydrocortisone (ANUSOL-HC) 25 MG suppository Place 25 mg rectally 2 (two) times daily.   Yes [provider]  Mesalamine (ASACOL HD) 800 MG TBEC Take 800 mg by mouth 3 (three) times daily.   Yes [provider]  metroNIDAZOLE (FLAGYL) 500 MG tablet Take 500 mg by mouth 3 (three) times daily. 07/02/17  Yes [provider]  ondansetron (ZOFRAN-ODT) 8 MG disintegrating tablet Take 8 mg by mouth every 8 (eight) hours as needed for nausea or vomiting.   Yes [provider]  potassium  chloride (K-DUR,KLOR-CON) 10 MEQ tablet Take 10 mEq by mouth 2 (two) times daily.   Yes [provider]  RABEprazole (ACIPHEX) 20 MG tablet Take 20 mg by mouth 2 (two) times daily.    Yes [provider]  vancomycin (VANCOCIN) 125 MG capsule Take 125 mg by mouth 4 (four) times daily. 06/11/17  Yes [provider]    Physical Exam: BP 126/75    Pulse 82   Resp 19   Ht 4\' 10"  (1.473 m)   Wt 40.8 kg (90 lb)   SpO2 98%   BMI 18.81 kg/m   General: 83 year old caucasian female, thin built laying on the bed stretcher at PACU, drowsy in no acute distress Eyes: Pupils are round and reactive to light ENT: Mucous membranes are moist, no erythema or drainage Neck: No JVD, or thyromegaly Cardiovascular: RRR with no murmurs, rubs or gallops Respiratory: Mild crackles at bases. No wheezes. Abdomen: Hypoactive bowel sounds. Left scaring located at left lower quadrant. Skin: Scar noted at left lower quadrant of abdomen Musculoskeletal: No noted muscular atrophy Psychiatric: Mood is appropriate for condition and setting. Neurologic: Alert and oriented x 3. No noted focal motor or sensory deficits.          Labs on Admission:  Basic Metabolic Panel:  Recent Labs Lab 07/03/17 0936  NA 129*  K 2.9*  CL 75*  CO2 36*  GLUCOSE 127*  BUN 25*  CREATININE 1.99*  CALCIUM 11.0*   Liver Function Tests:  Recent Labs Lab 07/03/17 0936  AST 40  ALT 21  ALKPHOS 59  BILITOT 1.3*  PROT 8.4*  ALBUMIN 4.3   No results for input(s): LIPASE, AMYLASE in the last 168 hours. No results for input(s): AMMONIA in the last 168 hours. CBC:  Recent Labs Lab 07/03/17 0936  WBC 7.9  NEUTROABS 6.3  HGB 14.3  HCT 40.6  MCV 87.9  PLT 292   Cardiac Enzymes: No results for input(s): CKTOTAL, CKMB, CKMBINDEX, TROPONINI in the last 168 hours.  BNP (last 3 results) No results for input(s): BNP in the last 8760 hours.  ProBNP (last 3 results) No results for input(s): PROBNP in the last 8760 hours.  CBG: No results for input(s): GLUCAP in the last 168 hours.  Radiological Exams on Admission: Ct Abdomen Pelvis Wo Contrast  Result Date: 07/03/2017 CLINICAL DATA:  Nausea and vomiting for 3 days. EXAM: CT ABDOMEN AND PELVIS WITHOUT CONTRAST TECHNIQUE: Multidetector CT imaging of the abdomen and pelvis was performed following the standard  protocol without IV contrast. COMPARISON:  None. FINDINGS: Lower chest: Bibasilar atelectasis/ scarring. Hepatobiliary: No focal liver abnormality is seen. Status post cholecystectomy. No biliary dilatation. Pancreas: Mild atrophy. No ductal dilatation or surrounding inflammatory changes. Spleen: Normal in size without focal abnormality. Adrenals/Urinary Tract: The adrenal glands are unremarkable. There are 2 simple cysts in the lower pole of the right kidney measuring up to 1.6 cm. No renal or ureteral calculi. No hydronephrosis. The bladder is decompressed. Stomach/Bowel: Multiple dilated loops of small bowel are noted with a transition point in a left femoral hernia. No wall thickening or pneumatosis. The stomach is distended. The large bowel is decompressed. Bowel sutures are noted in the distal sigmoid colon. The appendix is normal. Vascular/Lymphatic: Aortic atherosclerosis. No enlarged abdominal or pelvic lymph nodes. Reproductive: Uterus and bilateral adnexa are unremarkable. Other: No free fluid or pneumoperitoneum. Musculoskeletal: No acute or significant osseous findings. T11 and T12 compression deformities status post vertebroplasty. Prior left proximal femur  ORIF. IMPRESSION: 1. Left femoral hernia containing incarcerated small bowel with resultant high-grade obstruction. No wall thickening, pneumatosis, or free fluid. 2.  Aortic atherosclerosis (ICD10-I70.0). Electronically Signed   By: Obie DredgeWilliam T Derry M.D.   On: 07/03/2017 13:47   Dg Chest 2 View  Result Date: 07/03/2017 CLINICAL DATA:  Hypoxia, recent fall EXAM: CHEST  2 VIEW COMPARISON:  08/23/2016 FINDINGS: Cardiac shadow is at the upper limits of normal in size. Aortic calcifications are noted. Changes of prior vertebral augmentation are seen. Prior healed right clavicle and right rib fractures are seen. No focal infiltrate or sizable effusion is seen. No pneumothorax is noted. Old non treated compression fractures are noted in the upper and  lower thoracic spine stable from the prior study. IMPRESSION: No acute abnormality noted.  Chronic changes as described above. Electronically Signed   By: Alcide CleverMark  Lukens M.D.   On: 07/03/2017 10:31    EKG: Independently reviewed. Sinus tachycardia at a rate of 105.  Assessment/Plan Present on Admission: . Incarcerated hernia  Active Problems:   Incarcerated hernia  Left femoral hernia with incarcerated small bowel s/p repair POD#0 -General surgery, Dr Magnus IvanBlackman following; we appreciate recommendations -Pain management in place -Continue with close monitoring of symptoms.  AKI on CKD stage 4 -Cr 1.99, GFR 21 -IV fluid hydration with NS and D5w  Elevated anion gap in the setting of elevated lactic acid and worsening renal function -If persists, may consider ordering ABG -lactic acid is improving from 2.36 on admission to 1.67 -continue IV fluid hydration   Recent c-diff colitis with self reported recurrence -treated in the outpatient stating on po flagyl -allergic to vancomycin - continue po flagyl - c-diff pcr and enteric precaution in place.  Elevated lactic acid - improving  Hyponatremia/hypokalemia/hypomagnesemia -Most likely 2/2 to diarrhea -Na+ 129; K+ 2.9, mg2+ 1.7 -IV lfluid D5w NS with kcl 40 meq -Replete magnesium -Repeat BMP in the am  Ulcerative colitis -continue mesalamine  HLD -conitnue simvastatin  HTN -continue amlodipine and atenolol    DVT prophylaxis: SCds and lovenox 40 mg sq daily  Code Status: Full code  Family Communication: No family members at bedside.  Disposition Plan: Will be admitted to stepdown unit to continue close monitoring and to continue current management.  Consults called: General surgery.  Admission status: Inpatient     Darlin Droparole N [place] MD [facility] Pager [phone]  If 7PM-7AM, please contact night-coverage [email or web address] Password [email or web address]  07/03/2017, 5:20 PM

## 2017-07-03 NOTE — Consult Note (Signed)
Reason for Consult:  Incarcerated left femoral hernia Referring Physician: Jola Schmidt  Chief complaint: Ongoing nausea and vomiting times 3 days.  PCP:  Houston Siren., MD  Renee Fritz is an 83 y.o. female.  HPI: Patient is an 83 year old female who presents to the ED with 3 days of nausea and vomiting.  She has a history of coronary artery disease, ulcerative colitis, history of C. difficile colitis, neurogenic bladder and self caths with a history of UTIs.  History of hypertension and osteoporosis.  Her daughter reported patient having recurrent UTIs over the past months.  She has been on Keflex and vancomycin.  Was recently switched to Flagyl for possible C. difficile colitis.  Past surgical history significant for cholecystectomy and small bowel surgery..  Workup in the ED shows no temperature recorded but her blood pressure is soft in the 90-120 range.  Last blood pressure was 119/71.  Sodium is 129 potassium is 2.9 chloride is 75 glucose 127 creatinine 1.99 calcium is 11 bilirubin is 1.3.  Lactate is 2.36.  White count is 7.9 hemoglobin 14.3, hematocrit 40.6.  Platelets 292,000.  Urinalysis is negative for nitrates many bacteria 6-30 white cells per high-powered field.  CT scan shows a left femoral hernia containing incarcerated small bowel with resultant high-grade obstruction no wall thickening or pneumatosis or free fluid.  We are asked to see.  Past Medical History:  Diagnosis Date  . Hypertension   . Osteoporosis   . Other specified cardiac arrhythmias   . Pneumonia   . Ulcerative (chronic) enterocolitis ([place])     Past Surgical History:  Procedure Laterality Date  . urinary ostomy      No family history on file.  Social History:  reports that she has never smoked. She does not have any smokeless tobacco history on file. She reports that she does not drink alcohol or use drugs.  Allergies:  Allergies  Allergen Reactions  . Phenergan [Promethazine Hcl] Other (See  Comments)    Feels like bugs crawling all over  . Vancomycin Diarrhea and Other (See Comments)    Stomach pains  . Erythromycin-Sulfisoxazole Rash  . Sulfa Antibiotics Rash    Prior to Admission medications   Medication Sig Start Date End Date Taking? Authorizing Provider  amLODipine (NORVASC) 5 MG tablet Take 5 mg by mouth daily.   Yes [provider]  aspirin 81 MG chewable tablet Chew by mouth daily.   Yes [provider]  atenolol (TENORMIN) 100 MG tablet Take 100 mg by mouth 2 (two) times daily.    Yes [provider]  B-Complex-C-Biotin-Minerals-FA (FOLBEE PLUS CZ) 5 MG TABS Take 1 tablet by mouth daily.   Yes [provider]  busPIRone (BUSPAR) 5 MG tablet Take 5 mg by mouth 2 (two) times daily.   Yes [provider]  ciprofloxacin (CIPRO) 100 MG tablet Take 100 mg by mouth 2 (two) times daily.   Yes [provider]  ezetimibe-simvastatin (VYTORIN) 10-20 MG tablet Take 1 tablet by mouth daily.   Yes [provider]  hydrochlorothiazide (HYDRODIURIL) 25 MG tablet Take 25 mg by mouth daily.   Yes [provider]  hydrocortisone (ANUSOL-HC) 25 MG suppository Place 25 mg rectally 2 (two) times daily.   Yes [provider]  Mesalamine (ASACOL HD) 800 MG TBEC Take 800 mg by mouth 3 (three) times daily.   Yes [provider]  metroNIDAZOLE (FLAGYL) 500 MG tablet Take 500 mg by mouth 3 (three) times daily. 07/02/17  Yes [provider]  ondansetron (ZOFRAN-ODT) 8 MG disintegrating tablet Take 8 mg by mouth every 8 (eight) hours as needed for nausea or vomiting.   Yes [provider]  potassium chloride (K-DUR,KLOR-CON) 10 MEQ tablet Take 10 mEq by mouth 2 (two) times daily.   Yes [provider]  RABEprazole (ACIPHEX) 20 MG tablet Take 20 mg by mouth 2 (two) times daily.    Yes [provider]  vancomycin (VANCOCIN) 125 MG capsule Take 125 mg by mouth 4 (four) times  daily. 06/11/17  Yes [provider]     Results for orders placed or performed during the hospital encounter of 07/03/17 (from the past 48 hour(s))  Comprehensive metabolic panel     Status: Abnormal   Collection Time: 07/03/17  9:36 AM  Result Value Ref Range   Sodium 129 (L) 135 - 145 mmol/L   Potassium 2.9 (L) 3.5 - 5.1 mmol/L   Chloride 75 (L) 101 - 111 mmol/L   CO2 36 (H) 22 - 32 mmol/L   Glucose, Bld 127 (H) 65 - 99 mg/dL   BUN 25 (H) 6 - 20 mg/dL   Creatinine, Ser 1.99 (H) 0.44 - 1.00 mg/dL   Calcium 11.0 (H) 8.9 - 10.3 mg/dL   Total Protein 8.4 (H) 6.5 - 8.1 g/dL   Albumin 4.3 3.5 - 5.0 g/dL   AST 40 15 - 41 U/L   ALT 21 14 - 54 U/L   Alkaline Phosphatase 59 38 - 126 U/L   Total Bilirubin 1.3 (H) 0.3 - 1.2 mg/dL   GFR calc non Af Amer 21 (L) >60 mL/min   GFR calc Af Amer 24 (L) >60 mL/min    Comment: (NOTE) The eGFR has been calculated using the CKD EPI equation. This calculation has not been validated in all clinical situations. eGFR's persistently <60 mL/min signify possible Chronic Kidney Disease.    Anion gap 18 (H) 5 - 15  CBC with Differential     Status: None   Collection Time: 07/03/17  9:36 AM  Result Value Ref Range   WBC 7.9 4.0 - 10.5 K/uL   RBC 4.62 3.87 - 5.11 MIL/uL   Hemoglobin 14.3 12.0 - 15.0 g/dL   HCT 40.6 36.0 - 46.0 %   MCV 87.9 78.0 - 100.0 fL   MCH 31.0 26.0 - 34.0 pg   MCHC 35.2 30.0 - 36.0 g/dL   RDW 14.0 11.5 - 15.5 %   Platelets 292 150 - 400 K/uL   Neutrophils Relative % 79 %   Neutro Abs 6.3 1.7 - 7.7 K/uL   Lymphocytes Relative 13 %   Lymphs Abs 1.0 0.7 - 4.0 K/uL   Monocytes Relative 8 %   Monocytes Absolute 0.6 0.1 - 1.0 K/uL   Eosinophils Relative 0 %   Eosinophils Absolute 0.0 0.0 - 0.7 K/uL   Basophils Relative 0 %   Basophils Absolute 0.0 0.0 - 0.1 K/uL  I-Stat CG4 Lactic Acid, ED     Status: Abnormal   Collection Time: 07/03/17  9:45 AM  Result Value Ref Range   Lactic Acid, Venous 2.36 (HH) 0.5 - 1.9  mmol/L   Comment NOTIFIED PHYSICIAN   Urinalysis, Routine w reflex microscopic     Status: Abnormal   Collection Time: 07/03/17  9:54 AM  Result Value Ref Range   Color, Urine AMBER (A) YELLOW    Comment: BIOCHEMICALS MAY BE AFFECTED BY COLOR   APPearance CLOUDY (A) CLEAR   Specific Gravity,  Urine 1.017 1.005 - 1.030   pH 5.0 5.0 - 8.0   Glucose, UA NEGATIVE NEGATIVE mg/dL   Hgb urine dipstick NEGATIVE NEGATIVE   Bilirubin Urine NEGATIVE NEGATIVE   Ketones, ur 5 (A) NEGATIVE mg/dL   Protein, ur 100 (A) NEGATIVE mg/dL   Nitrite NEGATIVE NEGATIVE   Leukocytes, UA SMALL (A) NEGATIVE   RBC / HPF 0-5 0 - 5 RBC/hpf   WBC, UA 6-30 0 - 5 WBC/hpf   Bacteria, UA MANY (A) NONE SEEN   Squamous Epithelial / LPF 0-5 (A) NONE SEEN   Mucus PRESENT    Hyaline Casts, UA PRESENT   I-Stat CG4 Lactic Acid, ED     Status: None   Collection Time: 07/03/17 12:41 PM  Result Value Ref Range   Lactic Acid, Venous 1.67 0.5 - 1.9 mmol/L    Ct Abdomen Pelvis Wo Contrast  Result Date: 07/03/2017 CLINICAL DATA:  Nausea and vomiting for 3 days. EXAM: CT ABDOMEN AND PELVIS WITHOUT CONTRAST TECHNIQUE: Multidetector CT imaging of the abdomen and pelvis was performed following the standard protocol without IV contrast. COMPARISON:  None. FINDINGS: Lower chest: Bibasilar atelectasis/ scarring. Hepatobiliary: No focal liver abnormality is seen. Status post cholecystectomy. No biliary dilatation. Pancreas: Mild atrophy. No ductal dilatation or surrounding inflammatory changes. Spleen: Normal in size without focal abnormality. Adrenals/Urinary Tract: The adrenal glands are unremarkable. There are 2 simple cysts in the lower pole of the right kidney measuring up to 1.6 cm. No renal or ureteral calculi. No hydronephrosis. The bladder is decompressed. Stomach/Bowel: Multiple dilated loops of small bowel are noted with a transition point in a left femoral hernia. No wall thickening or pneumatosis. The stomach is distended. The  large bowel is decompressed. Bowel sutures are noted in the distal sigmoid colon. The appendix is normal. Vascular/Lymphatic: Aortic atherosclerosis. No enlarged abdominal or pelvic lymph nodes. Reproductive: Uterus and bilateral adnexa are unremarkable. Other: No free fluid or pneumoperitoneum. Musculoskeletal: No acute or significant osseous findings. T11 and T12 compression deformities status post vertebroplasty. Prior left proximal femur ORIF. IMPRESSION: 1. Left femoral hernia containing incarcerated small bowel with resultant high-grade obstruction. No wall thickening, pneumatosis, or free fluid. 2.  Aortic atherosclerosis (ICD10-I70.0). Electronically Signed   By: Titus Dubin M.D.   On: 07/03/2017 13:47   Dg Chest 2 View  Result Date: 07/03/2017 CLINICAL DATA:  Hypoxia, recent fall EXAM: CHEST  2 VIEW COMPARISON:  08/23/2016 FINDINGS: Cardiac shadow is at the upper limits of normal in size. Aortic calcifications are noted. Changes of prior vertebral augmentation are seen. Prior healed right clavicle and right rib fractures are seen. No focal infiltrate or sizable effusion is seen. No pneumothorax is noted. Old non treated compression fractures are noted in the upper and lower thoracic spine stable from the prior study. IMPRESSION: No acute abnormality noted.  Chronic changes as described above. Electronically Signed   By: Inez Catalina M.D.   On: 07/03/2017 10:31    Review of Systems  Constitutional: Positive for weight loss. Negative for chills, diaphoresis, fever and malaise/fatigue.  HENT: Positive for hearing loss (rather HOH).   Eyes: Negative.   Respiratory: Positive for cough (dry cough she attributes to allergies). Negative for hemoptysis, sputum production, shortness of breath and wheezing.   Cardiovascular: Positive for leg swelling (some occasional left leg swelling).  Gastrointestinal: Positive for abdominal pain, diarrhea (ongoing diarrhea for some time.), nausea and vomiting.  Negative for blood in stool, constipation, heartburn and melena.  Genitourinary:  Neurogenic bladder and she self cath's  Musculoskeletal: Positive for falls.  Skin: Negative.   Neurological: Negative.  Negative for weakness.  Endo/Heme/Allergies: Negative.   Psychiatric/Behavioral: Negative.    Blood pressure 115/70, pulse 92, resp. rate 19, SpO2 93 %. Physical Exam  Constitutional: She is oriented to person, place, and time. No distress.  Frail cachectic female in no acute distress  HENT:  Head: Normocephalic and atraumatic.  Mouth/Throat: No oropharyngeal exudate.  Eyes: Right eye exhibits no discharge. Left eye exhibits no discharge. No scleral icterus.  Pupils are equal  Neck: Normal range of motion. Neck supple. No JVD present. No tracheal deviation present. No thyromegaly present.  Cardiovascular: Normal rate, regular rhythm, normal heart sounds and intact distal pulses.   No murmur heard. Respiratory: Effort normal and breath sounds normal. No respiratory distress. She has no wheezes. She has no rales. She exhibits no tenderness.  GI: Soft. She exhibits distension. She exhibits no mass. There is tenderness (LLQ with palpable femoral hernia). There is no rebound and no guarding.  Musculoskeletal: She exhibits no edema or tenderness.  Lymphadenopathy:    She has no cervical adenopathy.  Neurological: She is alert and oriented to person, place, and time. No cranial nerve deficit.  Skin: Skin is warm and dry. No rash noted. She is not diaphoretic. No erythema. No pallor.  Psychiatric: She has a normal mood and affect. Her behavior is normal. Judgment and thought content normal.    Assessment/Plan: Incarcerated left femoral hernia Hx of VHR, SB resection, open appendectomy Acute kidney injury Ongoing diarrhea Weight loss Hyponatremia History of ulcerative colitis and C. difficile colitis. Neurogenic bladder/Self-Cath/recurrent UTIs. Hx of CAD Hx of tobacco use - 20  years, none for at least 40 years  Plan:  Medicine admit, to OR for repair of incarcerated hernia, rehydrate.  Medical management and evaluation of diarrhea.  WE plan to put NG in when she is asleep.       Renee Fritz 07/03/2017, 2:26 PM

## 2017-07-03 NOTE — ED Provider Notes (Signed)
[facility] EMERGENCY DEPARTMENT Provider Note   CSN: [id number] Arrival date & time: 07/03/17  4098     History   Chief Complaint Chief Complaint  Patient presents with  . Fall  . Emesis    HPI Renee Fritz is a 83 y.o. female who presents with N/V and a fall. PMH significant for CAD, ulcerative colitis, hx of C.diff, neurogenic bladder with self-caths, hx of UTI. HTN, osteoporosis, Daughter is at bedside who helps provide history. She states that her mother has had recurrent UTIs over the past couple months. She has been on and off Keflex for this. She was switched to Vancomycin which started giving her diarrhea. She was switched to Flagyl which improved her symptoms. She became worried that the diarrhea would continue after stopping the Flagyl so called her doctor's office asking for a refill and they gave her an rx for Vancomycin again. She has been taking this and again started having upset stomach and diarrhea. This has been going on for 2-3 days. Her daughter states that her house and lots of clothes have been soiled. She saw GI yesterday in [facility] who told her to stop Vancomycin and restarted her on Flagyl for possible C.diff. She has stopped the Vancomycin but has not started Flagyl yet. She was prescribed Zofran with mild relief. Daughter is concerned the patient is dehydrated. Past surgical hx significant for cholecystectomy, small bowel surgery.  HPI  Past Medical History:  Diagnosis Date  . Hypertension   . Osteoporosis   . Other specified cardiac arrhythmias   . Pneumonia   . Ulcerative (chronic) enterocolitis (HCC)     There are no active problems to display for this patient.   Past Surgical History:  Procedure Laterality Date  . urinary ostomy      OB History    No data available      Home Medications    Prior to Admission medications   Medication Sig Start Date End Date Taking? Authorizing Provider  amLODipine-benazepril  (LOTREL) 10-20 MG capsule Take 1 capsule by mouth daily.    [provider]  aspirin 81 MG chewable tablet Chew by mouth daily.    [provider]  atenolol (TENORMIN) 100 MG tablet Take 100 mg by mouth daily.    [provider]  cetirizine (ZYRTEC) 5 MG tablet Take 5 mg by mouth daily.    [provider]  ciprofloxacin (CIPRO) 100 MG tablet Take 100 mg by mouth 2 (two) times daily.    [provider]  ezetimibe-simvastatin (VYTORIN) 10-20 MG tablet Take 1 tablet by mouth daily.    [provider]  RABEprazole (ACIPHEX) 20 MG tablet Take 20 mg by mouth daily.    [provider]    Family History No family history on file.  Social History Social History  Substance Use Topics  . Smoking status: Never Smoker  . Smokeless tobacco: Not on file  . Alcohol use No     Allergies   Phenergan [promethazine hcl]; Erythromycin-sulfisoxazole; and Sulfa antibiotics   Review of Systems Review of Systems  Constitutional: Positive for appetite change. Negative for chills and fever.  Respiratory: Negative for shortness of breath.   Cardiovascular: Negative for chest pain.  Gastrointestinal: Positive for abdominal pain, diarrhea, nausea and vomiting. Negative for blood in stool.  Genitourinary: Positive for difficulty urinating (chronic). Negative for dysuria and frequency.  Musculoskeletal: Positive for back pain.  Neurological: Negative for syncope and light-headedness.  Physical Exam Updated Vital Signs BP 94/65 (BP Location: Right Arm)   Pulse (!) 102   Resp 14   SpO2 90%   Physical Exam  Constitutional: She is oriented to person, place, and time. She appears well-developed and well-nourished. No distress.  Frail elderly female in NAD  HENT:  Head: Normocephalic and atraumatic.  Eyes: Pupils are equal, round, and reactive to light. Conjunctivae are normal. Right eye exhibits no discharge. Left eye exhibits no  discharge. No scleral icterus.  Neck: Normal range of motion.  Cardiovascular: Normal rate and regular rhythm.  Exam reveals no gallop and no friction rub.   No murmur heard. Pulmonary/Chest: Effort normal and breath sounds normal. No respiratory distress. She has no wheezes. She has no rales. She exhibits no tenderness.  Bibasilar crackles  Abdominal: Soft. Bowel sounds are normal. She exhibits no distension and no mass. There is tenderness (LLQ tenderness). There is no rebound and no guarding.  Large abdominal surgical scar  Musculoskeletal:  Kyphotic spine  Neurological: She is alert and oriented to person, place, and time.  Skin: Skin is warm and dry.  Psychiatric: She has a normal mood and affect. Her behavior is normal.  Nursing note and vitals reviewed.    ED Treatments / Results  Labs (all labs ordered are listed, but only abnormal results are displayed) Labs Reviewed  I-STAT CG4 LACTIC ACID, ED - Abnormal; Notable for the following:       Result Value   Lactic Acid, Venous 2.36 (*)    All other components within normal limits  COMPREHENSIVE METABOLIC PANEL  CBC WITH DIFFERENTIAL/PLATELET  URINALYSIS, ROUTINE W REFLEX MICROSCOPIC    EKG  EKG Interpretation None       Radiology Ct Abdomen Pelvis Wo Contrast  Result Date: 07/03/2017 CLINICAL DATA:  Nausea and vomiting for 3 days. EXAM: CT ABDOMEN AND PELVIS WITHOUT CONTRAST TECHNIQUE: Multidetector CT imaging of the abdomen and pelvis was performed following the standard protocol without IV contrast. COMPARISON:  None. FINDINGS: Lower chest: Bibasilar atelectasis/ scarring. Hepatobiliary: No focal liver abnormality is seen. Status post cholecystectomy. No biliary dilatation. Pancreas: Mild atrophy. No ductal dilatation or surrounding inflammatory changes. Spleen: Normal in size without focal abnormality. Adrenals/Urinary Tract: The adrenal glands are unremarkable. There are 2 simple cysts in the lower pole of the right  kidney measuring up to 1.6 cm. No renal or ureteral calculi. No hydronephrosis. The bladder is decompressed. Stomach/Bowel: Multiple dilated loops of small bowel are noted with a transition point in a left femoral hernia. No wall thickening or pneumatosis. The stomach is distended. The large bowel is decompressed. Bowel sutures are noted in the distal sigmoid colon. The appendix is normal. Vascular/Lymphatic: Aortic atherosclerosis. No enlarged abdominal or pelvic lymph nodes. Reproductive: Uterus and bilateral adnexa are unremarkable. Other: No free fluid or pneumoperitoneum. Musculoskeletal: No acute or significant osseous findings. T11 and T12 compression deformities status post vertebroplasty. Prior left proximal femur ORIF. IMPRESSION: 1. Left femoral hernia containing incarcerated small bowel with resultant high-grade obstruction. No wall thickening, pneumatosis, or free fluid. 2.  Aortic atherosclerosis (ICD10-I70.0). Electronically Signed   By: Obie Dredge M.D.   On: 07/03/2017 13:47   Dg Chest 2 View  Result Date: 07/03/2017 CLINICAL DATA:  Hypoxia, recent fall EXAM: CHEST  2 VIEW COMPARISON:  08/23/2016 FINDINGS: Cardiac shadow is at the upper limits of normal in size. Aortic calcifications are noted. Changes of prior vertebral augmentation are seen. Prior healed right clavicle and right rib fractures are  seen. No focal infiltrate or sizable effusion is seen. No pneumothorax is noted. Old non treated compression fractures are noted in the upper and lower thoracic spine stable from the prior study. IMPRESSION: No acute abnormality noted.  Chronic changes as described above. Electronically Signed   By: Alcide Clever M.D.   On: 07/03/2017 10:31    Procedures Procedures (including critical care time)  CRITICAL CARE Performed by: Bethel Born  Total critical care time: 30 minutes  Critical care time was exclusive of separately billable procedures and treating other patients.  Critical  care was necessary to treat or prevent imminent or life-threatening deterioration (incarcerated hernia that needed to go to OR emergently)  Critical care was time spent personally by me on the following activities: development of treatment plan with patient and/or surrogate as well as nursing, discussions with consultants, evaluation of patient's response to treatment, examination of patient, obtaining history from patient or surrogate, ordering and performing treatments and interventions, ordering and review of laboratory studies, ordering and review of radiographic studies, pulse oximetry and re-evaluation of patient's condition.   Medications Ordered in ED Medications  iopamidol (ISOVUE-300) 61 % injection (not administered)  bupivacaine-EPINEPHrine (MARCAINE W/ EPI) 0.5% -1:200000 (with pres) injection (10 mLs Infiltration Given 07/03/17 1523)  0.9 % irrigation (POUR BTL) (1,000 mLs Irrigation Given 07/03/17 1524)  dextrose 5 % and 0.9 % NaCl with KCl 40 mEq/L infusion (not administered)  cefoTEtan in Dextrose 5% (CEFOTAN) IVPB 2 g (not administered)  lactated ringers infusion ( Intravenous New Bag/Given 07/03/17 1542)  sodium chloride 0.9 % bolus 1,000 mL (0 mLs Intravenous Stopped 07/03/17 1124)  metoCLOPramide (REGLAN) injection 10 mg (10 mg Intravenous Given 07/03/17 1125)  potassium chloride 10 mEq in 100 mL IVPB (0 mEq Intravenous Stopped 07/03/17 1233)  sodium chloride 0.9 % bolus 1,000 mL (1,000 mLs Intravenous New Bag/Given 07/03/17 1314)  ondansetron (ZOFRAN) injection 4 mg (4 mg Intravenous Given 07/03/17 1314)  fentaNYL (SUBLIMAZE) injection 25 mcg (25 mcg Intravenous Given 07/03/17 1453)  lidocaine (XYLOCAINE) 2 % jelly 1 application (1 application Topical Given 07/03/17 1454)     Initial Impression / Assessment and Plan / ED Course  I have reviewed the triage vital signs and the nursing notes.  Pertinent labs & imaging results that were available during my care of the  patient were reviewed by me and considered in my medical decision making (see chart for details).  59AM: 83 year old female presents with intractable N/V as well as LLQ abdominal pain and diarrhea. Rectal temp is 99.9 and she is tachycardic and BP is soft. She also has transient hypoxia on monitor but this appears to be a poor waveform. She is vomiting on exam and has LLQ tenderness. Fluids, Reglan, CT scan ordered.  CBC is unremarkable. CMP shows multiple derangements. She has hyponatremia (129), hypokalemia (2.9), hypochloremia (75), AKI (1.99 today), hypercalcemia, elevated anion gap (18). Lactic acid is 2.36. UA shows 5 ketones, small leukocytes, many bacteria, 6-30 WBC. Culture was sent.  1:30PM: CT shows incarcerated small bowel in a left femoral hernia. She has had multiple antiemetics and has had intractable vomiting. Spoke with Will Marlyne Beards PA-C with surgery who will come to see patient.  2:00 PM: Pt will go to emergently to OR  Final Clinical Impressions(s) / ED Diagnoses   Final diagnoses:  AKI (acute kidney injury) (HCC)  Incarcerated femoral hernia  Intractable vomiting with nausea, unspecified vomiting type  Diarrhea, unspecified type  Hypokalemia    New Prescriptions  New Prescriptions   No medications on file     Beryle QuantGekas, Kaelob Persky Marie, PA-C 07/03/17 1618    Azalia Bilisampos, Kevin, MD 07/03/17 [id number]

## 2017-07-03 NOTE — Anesthesia Preprocedure Evaluation (Addendum)
Anesthesia Evaluation  Patient identified by MRN, date of birth, ID band Patient awake    Reviewed: Allergy & Precautions, NPO status , Patient's Chart, lab work & pertinent test results, reviewed documented beta blocker date and time   Airway Mallampati: III  TM Distance: >3 FB Neck ROM: Full    Dental  (+) Dental Advisory Given, Partial Upper, Missing   Pulmonary pneumonia, resolved,    Pulmonary exam normal breath sounds clear to auscultation       Cardiovascular hypertension, Pt. on home beta blockers and Pt. on medications + CAD  Normal cardiovascular exam+ dysrhythmias  Rhythm:Regular Rate:Normal     Neuro/Psych negative neurological ROS  negative psych ROS   GI/Hepatic Neg liver ROS, PUD, Ulcerative colitis, Left femoral hernia with incarcerated small bowel   Endo/Other  negative endocrine ROS  Renal/GU ARFRenal disease  negative genitourinary   Musculoskeletal negative musculoskeletal ROS (+)   Abdominal   Peds  Hematology negative hematology ROS (+)   Anesthesia Other Findings Hypokalemia, hyponatremia  Reproductive/Obstetrics                           Anesthesia Physical Anesthesia Plan  ASA: III and emergent  Anesthesia Plan: General   Post-op Pain Management:    Induction: Intravenous, Rapid sequence and Cricoid pressure planned  PONV Risk Score and Plan: 4 or greater and Ondansetron, Treatment may vary due to age or medical condition, Dexamethasone and Propofol infusion  Airway Management Planned: Oral ETT  Additional Equipment: None  Intra-op Plan:   Post-operative Plan: Extubation in OR  Informed Consent: I have reviewed the patients History and Physical, chart, labs and discussed the procedure including the risks, benefits and alternatives for the proposed anesthesia with the patient or authorized representative who has indicated his/her understanding and  acceptance.   Dental advisory given  Plan Discussed with: CRNA  Anesthesia Plan Comments:       Anesthesia Quick Evaluation

## 2017-07-04 ENCOUNTER — Encounter (HOSPITAL_COMMUNITY): Payer: Self-pay | Admitting: Surgery

## 2017-07-04 DIAGNOSIS — N179 Acute kidney failure, unspecified: Secondary | ICD-10-CM

## 2017-07-04 DIAGNOSIS — Z8719 Personal history of other diseases of the digestive system: Secondary | ICD-10-CM

## 2017-07-04 DIAGNOSIS — Z9889 Other specified postprocedural states: Secondary | ICD-10-CM

## 2017-07-04 DIAGNOSIS — R112 Nausea with vomiting, unspecified: Secondary | ICD-10-CM

## 2017-07-04 DIAGNOSIS — E876 Hypokalemia: Secondary | ICD-10-CM

## 2017-07-04 DIAGNOSIS — R197 Diarrhea, unspecified: Secondary | ICD-10-CM

## 2017-07-04 DIAGNOSIS — K460 Unspecified abdominal hernia with obstruction, without gangrene: Secondary | ICD-10-CM

## 2017-07-04 LAB — GLUCOSE, CAPILLARY
GLUCOSE-CAPILLARY: 133 mg/dL — AB (ref 65–99)
GLUCOSE-CAPILLARY: 99 mg/dL (ref 65–99)
Glucose-Capillary: 139 mg/dL — ABNORMAL HIGH (ref 65–99)
Glucose-Capillary: 117 mg/dL — ABNORMAL HIGH (ref 65–99)

## 2017-07-04 LAB — COMPREHENSIVE METABOLIC PANEL
ALBUMIN: 3.1 g/dL — AB (ref 3.5–5.0)
ALK PHOS: 33 U/L — AB (ref 38–126)
ALT: 15 U/L (ref 14–54)
AST: 26 U/L (ref 15–41)
Anion gap: 11 (ref 5–15)
BUN: 18 mg/dL (ref 6–20)
CALCIUM: 8.3 mg/dL — AB (ref 8.9–10.3)
CO2: 29 mmol/L (ref 22–32)
CREATININE: 0.99 mg/dL (ref 0.44–1.00)
Chloride: 93 mmol/L — ABNORMAL LOW (ref 101–111)
GFR calc Af Amer: 57 mL/min — ABNORMAL LOW (ref >60)
GFR calc non Af Amer: 49 mL/min — ABNORMAL LOW (ref >60)
GLUCOSE: 219 mg/dL — AB (ref 65–99)
Potassium: 3.4 mmol/L — ABNORMAL LOW (ref 3.5–5.1)
SODIUM: 133 mmol/L — AB (ref 135–145)
Total Bilirubin: 0.5 mg/dL (ref 0.3–1.2)
Total Protein: 6 g/dL — ABNORMAL LOW (ref 6.5–8.1)

## 2017-07-04 LAB — CBC
HCT: 31.1 % — ABNORMAL LOW (ref 36.0–46.0)
HEMOGLOBIN: 10.6 g/dL — AB (ref 12.0–15.0)
MCH: 30.3 pg (ref 26.0–34.0)
MCHC: 34.1 g/dL (ref 30.0–36.0)
MCV: 88.9 fL (ref 78.0–100.0)
Platelets: 205 10*3/uL (ref 150–400)
RBC: 3.5 MIL/uL — AB (ref 3.87–5.11)
RDW: 14.5 % (ref 11.5–15.5)
WBC: 7.8 10*3/uL (ref 4.0–10.5)

## 2017-07-04 LAB — HEMOGLOBIN AND HEMATOCRIT, BLOOD
HCT: 30.4 % — ABNORMAL LOW (ref 36.0–46.0)
Hemoglobin: 10.7 g/dL — ABNORMAL LOW (ref 12.0–15.0)

## 2017-07-04 LAB — HEMOGLOBIN A1C
Hgb A1c MFr Bld: 5.8 % — ABNORMAL HIGH (ref 4.8–5.6)
Mean Plasma Glucose: 119.76 mg/dL

## 2017-07-04 MED ORDER — INSULIN ASPART 100 UNIT/ML ~~LOC~~ SOLN
0.0000 [IU] | SUBCUTANEOUS | Status: DC
  Administered 2017-07-04 – 2017-07-06 (×4): 1 [IU] via SUBCUTANEOUS
  Administered 2017-07-07: 3 [IU] via SUBCUTANEOUS
  Administered 2017-07-08: 1 [IU] via SUBCUTANEOUS

## 2017-07-04 MED ORDER — ACETAMINOPHEN 10 MG/ML IV SOLN
1000.0000 mg | Freq: Three times a day (TID) | INTRAVENOUS | Status: AC
  Administered 2017-07-04 – 2017-07-05 (×2): 1000 mg via INTRAVENOUS
  Filled 2017-07-04 (×4): qty 100

## 2017-07-04 MED ORDER — ACETAMINOPHEN 10 MG/ML IV SOLN: 1000.0000 mg | Freq: Four times a day (QID) | INTRAVENOUS | Status: DC

## 2017-07-04 MED ORDER — HYDRALAZINE HCL 20 MG/ML IJ SOLN: 5.0000 mg | Freq: Four times a day (QID) | INTRAMUSCULAR | Status: DC | PRN

## 2017-07-04 NOTE — Plan of Care (Signed)
Problem: Pain Managment: Goal: General experience of comfort will improve Outcome: Progressing Patient able to venerealis how much pain she is experiencing using the pain scale 1-10 with pain of 10 feeling like an elephant is standing on her chest. Patient states pain is much better after ambulating the circle in the nursing station.

## 2017-07-04 NOTE — Progress Notes (Addendum)
PROGRESS NOTE  Renee Fritz WUJ:[id number] DOB: Jan 06, 1926 DOA: 07/03/2017 PCP: Lester Cody., MD  HPI/Recap of past 24 hours: Pt seen and examined with family members at bedside. She denies abdominal pain, nausea, vomiting or diarrhea. No stools reported overnight.  Assessment/Plan: Active Problems:   Incarcerated hernia   Incarcerated femoral hernia   Code Status: Full  Family Communication: Daughter, all questions answered to her satisfaction.  Disposition Plan: Will keep another midnight to continue treatment.   Consultants:  General surgery  Procedures:  Left inguinal hernia repair  Antimicrobials:  Po flagyl for c-diff; pt allergic to vancomycin  DVT prophylaxis:  Lovenox 40 mg sq daily   Objective: Vitals:   07/04/17 0300 07/04/17 0400 07/04/17 0500 07/04/17 0600  BP: (!) 90/53 (!) 96/54 115/60 (!) 90/51  Pulse: 69 62 68 60  Resp: 14 14 13 14   Temp: 98.8 F (37.1 C)     TempSrc: Oral     SpO2: 100% 100% 100% 100%  Weight:      Height:        Intake/Output Summary (Last 24 hours) at 07/04/17 0728 Last data filed at 07/04/17 0600  Gross per 24 hour  Intake          3671.58 ml  Output             2175 ml  Net          1496.58 ml   Filed Weights   07/03/17 1538 07/03/17 2100  Weight: 40.8 kg (90 lb) 43.8 kg (96 lb 9 oz)    Exam:   General: 83 year old caucasian female, thin built laying in bed in no acute distress. Hard of hearing.  Eyes: Pupils are round and reactive to light  ENT: Mucous membranes are moist, no erythema or drainage  Neck: No JVD, or thyromegaly  Cardiovascular: RRR with no murmurs, rubs or gallops  Respiratory: Mild crackles at bases. No wheezes.  Abdomen: Hypoactive bowel sounds. Left scaring from surgical incision located at left lower quadrant.  Skin: Scar noted at left lower quadrant of abdomen  Musculoskeletal: No noted muscular atrophy  Psychiatric: Mood is appropriate for condition and  setting.  Neurologic: Alert and oriented x 3. No noted focal motor or sensory deficits.   Data Reviewed: CBC:  Recent Labs Lab 07/03/17 0936 07/04/17 0237  WBC 7.9 7.8  NEUTROABS 6.3  --   HGB 14.3 10.6*  HCT 40.6 31.1*  MCV 87.9 88.9  PLT 292 205   Basic Metabolic Panel:  Recent Labs Lab 07/03/17 0936 07/04/17 0237  NA 129* 133*  K 2.9* 3.4*  CL 75* 93*  CO2 36* 29  GLUCOSE 127* 219*  BUN 25* 18  CREATININE 1.99* 0.99  CALCIUM 11.0* 8.3*   GFR: Estimated Creatinine Clearance: 26.6 mL/min (by C-G formula based on SCr of 0.99 mg/dL). Liver Function Tests:  Recent Labs Lab 07/03/17 0936 07/04/17 0237  AST 40 26  ALT 21 15  ALKPHOS 59 33*  BILITOT 1.3* 0.5  PROT 8.4* 6.0*  ALBUMIN 4.3 3.1*   No results for input(s): LIPASE, AMYLASE in the last 168 hours. No results for input(s): AMMONIA in the last 168 hours. Coagulation Profile: No results for input(s): INR, PROTIME in the last 168 hours. Cardiac Enzymes: No results for input(s): CKTOTAL, CKMB, CKMBINDEX, TROPONINI in the last 168 hours. BNP (last 3 results) No results for input(s): PROBNP in the last 8760 hours. HbA1C: No results for input(s): HGBA1C in the last 72  hours. CBG: No results for input(s): GLUCAP in the last 168 hours. Lipid Profile: No results for input(s): CHOL, HDL, LDLCALC, TRIG, CHOLHDL, LDLDIRECT in the last 72 hours. Thyroid Function Tests: No results for input(s): TSH, T4TOTAL, FREET4, T3FREE, THYROIDAB in the last 72 hours. Anemia Panel: No results for input(s): VITAMINB12, FOLATE, FERRITIN, TIBC, IRON, RETICCTPCT in the last 72 hours. Urine analysis:    Component Value Date/Time   COLORURINE AMBER (A) 07/03/2017 0954   APPEARANCEUR CLOUDY (A) 07/03/2017 0954   LABSPEC 1.017 07/03/2017 0954   PHURINE 5.0 07/03/2017 0954   GLUCOSEU NEGATIVE 07/03/2017 0954   HGBUR NEGATIVE 07/03/2017 0954   BILIRUBINUR NEGATIVE 07/03/2017 0954   KETONESUR 5 (A) 07/03/2017 0954    PROTEINUR 100 (A) 07/03/2017 0954   NITRITE NEGATIVE 07/03/2017 0954   LEUKOCYTESUR SMALL (A) 07/03/2017 0954   Sepsis Labs: @LABRCNTIP (procalcitonin:4,lacticidven:4)  ) Recent Results (from the past 240 hour(s))  MRSA PCR Screening     Status: Abnormal   Collection Time: 07/03/17  9:01 PM  Result Value Ref Range Status   MRSA by PCR POSITIVE (A) NEGATIVE Final    Comment:        The GeneXpert MRSA Assay (FDA approved for NASAL specimens only), is one component of a comprehensive MRSA colonization surveillance program. It is not intended to diagnose MRSA infection nor to guide or monitor treatment for MRSA infections. RESULT CALLED TO, READ BACK BY AND VERIFIED WITH: T.NELSON RN 07/03/17 2334 L.CHAMPION       Studies: Ct Abdomen Pelvis Wo Contrast  Result Date: 07/03/2017 CLINICAL DATA:  Nausea and vomiting for 3 days. EXAM: CT ABDOMEN AND PELVIS WITHOUT CONTRAST TECHNIQUE: Multidetector CT imaging of the abdomen and pelvis was performed following the standard protocol without IV contrast. COMPARISON:  None. FINDINGS: Lower chest: Bibasilar atelectasis/ scarring. Hepatobiliary: No focal liver abnormality is seen. Status post cholecystectomy. No biliary dilatation. Pancreas: Mild atrophy. No ductal dilatation or surrounding inflammatory changes. Spleen: Normal in size without focal abnormality. Adrenals/Urinary Tract: The adrenal glands are unremarkable. There are 2 simple cysts in the lower pole of the right kidney measuring up to 1.6 cm. No renal or ureteral calculi. No hydronephrosis. The bladder is decompressed. Stomach/Bowel: Multiple dilated loops of small bowel are noted with a transition point in a left femoral hernia. No wall thickening or pneumatosis. The stomach is distended. The large bowel is decompressed. Bowel sutures are noted in the distal sigmoid colon. The appendix is normal. Vascular/Lymphatic: Aortic atherosclerosis. No enlarged abdominal or pelvic lymph nodes.  Reproductive: Uterus and bilateral adnexa are unremarkable. Other: No free fluid or pneumoperitoneum. Musculoskeletal: No acute or significant osseous findings. T11 and T12 compression deformities status post vertebroplasty. Prior left proximal femur ORIF. IMPRESSION: 1. Left femoral hernia containing incarcerated small bowel with resultant high-grade obstruction. No wall thickening, pneumatosis, or free fluid. 2.  Aortic atherosclerosis (ICD10-I70.0). Electronically Signed   By: Obie DredgeWilliam T Derry M.D.   On: 07/03/2017 13:47   Dg Chest 2 View  Result Date: 07/03/2017 CLINICAL DATA:  Hypoxia, recent fall EXAM: CHEST  2 VIEW COMPARISON:  08/23/2016 FINDINGS: Cardiac shadow is at the upper limits of normal in size. Aortic calcifications are noted. Changes of prior vertebral augmentation are seen. Prior healed right clavicle and right rib fractures are seen. No focal infiltrate or sizable effusion is seen. No pneumothorax is noted. Old non treated compression fractures are noted in the upper and lower thoracic spine stable from the prior study. IMPRESSION: No acute abnormality noted.  Chronic  changes as described above. Electronically Signed   By: Alcide Clever M.D.   On: 07/03/2017 10:31   Dg Abd Portable 1v  Result Date: 07/03/2017 CLINICAL DATA:  Hernia repair, nasogastric tube positioning. EXAM: PORTABLE ABDOMEN - 1 VIEW COMPARISON:  CT abdomen 07/03/2017 FINDINGS: The nasogastric tube enters the stomach and its curvature would suggest positioning in the stomach body. Lower thoracic vertebral augmentations. Old bilateral rib fractures with callus formation. Indistinct airspace opacities medially at the left lung base. IMPRESSION: 1. Nasogastric tube tip is in the stomach body. 2. Left basilar airspace opacities are nonspecific but pneumonia is not excluded. Electronically Signed   By: Gaylyn Rong M.D.   On: 07/03/2017 18:08    Scheduled Meds: . amLODipine  5 mg Oral Daily  . atenolol  100 mg Oral  BID  . enoxaparin (LOVENOX) injection  30 mg Subcutaneous Q24H  . Mesalamine  800 mg Oral TID  . metroNIDAZOLE  500 mg Oral TID    Continuous Infusions: . dextrose 5 % and 0.9 % NaCl with KCl 40 mEq/L 125 mL/hr at 07/04/17 0240  . lactated ringers Stopped (07/03/17 2100)     LOS: 1 day   Assessment/Plan  Left femoral hernia with incarcerated small bowel s/p repair POD#1 -General surgery, Dr Magnus Ivan following; we appreciate recommendations -Pain management in place morphine prn, tylenol prn -Continue with close monitoring of symptoms.  AKI on CKD stage 4 -resolved; cr 0.99 -IV fluid hydration with NS and D5w  Elevated anion gap in the setting of elevated lactic acid and worsening renal function -If persists, may consider ordering ABG -lactic acid is improving from 2.36 on admission to 1.67 -continue IV fluid hydration   Recent c-diff colitis with self reported recurrence -treated in the outpatient stating on po flagyl -allergic to vancomycin - continue po flagyl - c-diff pcr and enteric precaution in place.  Elevated lactic acid - improving  Hyponatremia/hypokalemia/hypomagnesemia -Most likely 2/2 to diarrhea -Improving -Na+ 129 to 133; K+ 2.9 to 3.4, mg2+ 1.7 -IV lfluid D5w NS with kcl 40 meq -Replete magnesium -Repeat BMP in the am  Ulcerative colitis -continue mesalamine  HLD -conitnue simvastatin  HTN -continue amlodipine and atenolol  Deconditioning -OOB to chair -PT consulted; we appreciate recommendations.  Darlin Drop, MD [facility] Pager [phone]  If 7PM-7AM, please contact night-coverage [email or web address] Password [email or web address] 07/04/2017, 7:28 AM

## 2017-07-04 NOTE — Progress Notes (Signed)
[facility] Progress Note  1 Day Post-Op  Subjective: CC:  Renee Fritz at bedside. Patient is confused this AM but is able to be re-oriented and responds appropriately to questions. Minimal abdominal pain this AM. Denies flatus or BM. Has not been out of bed since surgery. Back is sore 2/2 fall yesterday. At baseline patient performs I&O caths at home due to bladder injury she sustained over 10 years ago. Pt ambulates with cane.  Objective: Vital signs in last 24 hours: Temp:  [97.3 F (36.3 C)-98.8 F (37.1 C)] 97.8 F (36.6 C) (11/01 0700) Pulse Rate:  [60-101] 60 (11/01 0600) Resp:  [11-24] 14 (11/01 0600) BP: (90-136)/(51-92) 90/51 (11/01 0600) SpO2:  [90 %-100 %] 100 % (11/01 0600) Weight:  [40.8 kg (90 lb)-43.8 kg (96 lb 9 oz)] 43.8 kg (96 lb 9 oz) (10/31 2100) Last BM Date: 07/03/17 (prior to surgery)  Intake/Output from previous day: 10/31 0701 - 11/01 0700 In: 3671.6 [I.V.:2291.6; NG/GT:30; IV Piggyback:1350] Out: 2175 [Urine:400; Emesis/NG output:450; Blood:25] Intake/Output this shift: No intake/output data recorded.  PE: Gen:  Alert, NAD, pleasant Card:  Regular rate and rhythm, pedal pulses 2+ BL Pulm:  Normal effort, clear to auscultation bilaterally Abd: Soft, non-tender, non-distended, hypoactive bowel sounds, LLQ Incision C/D/I Skin: warm and dry, no rashes  Psych: A&Ox3   Lab Results:   Recent Labs  07/03/17 0936 07/04/17 0237 07/04/17 0756  WBC 7.9 7.8  --   HGB 14.3 10.6* 10.7*  HCT 40.6 31.1* 30.4*  PLT 292 205  --    BMET  Recent Labs  07/03/17 0936 07/04/17 0237  NA 129* 133*  K 2.9* 3.4*  CL 75* 93*  CO2 36* 29  GLUCOSE 127* 219*  BUN 25* 18  CREATININE 1.99* 0.99  CALCIUM 11.0* 8.3*   PT/INR No results for input(s): LABPROT, INR in the last 72 hours. CMP     Component Value Date/Time   NA 133 (L) 07/04/2017 0237   K 3.4 (L) 07/04/2017 0237   CL 93 (L) 07/04/2017 0237   CO2 29 07/04/2017 0237   GLUCOSE 219  (H) 07/04/2017 0237   BUN 18 07/04/2017 0237   CREATININE 0.99 07/04/2017 0237   CALCIUM 8.3 (L) 07/04/2017 0237   PROT 6.0 (L) 07/04/2017 0237   ALBUMIN 3.1 (L) 07/04/2017 0237   AST 26 07/04/2017 0237   ALT 15 07/04/2017 0237   ALKPHOS 33 (L) 07/04/2017 0237   BILITOT 0.5 07/04/2017 0237   GFRNONAA 49 (L) 07/04/2017 0237   GFRAA 57 (L) 07/04/2017 0237   Lipase  No results found for: LIPASE     Studies/Results: Ct Abdomen Pelvis Wo Contrast  Result Date: 07/03/2017 CLINICAL DATA:  Nausea and vomiting for 3 days. EXAM: CT ABDOMEN AND PELVIS WITHOUT CONTRAST TECHNIQUE: Multidetector CT imaging of the abdomen and pelvis was performed following the standard protocol without IV contrast. COMPARISON:  None. FINDINGS: Lower chest: Bibasilar atelectasis/ scarring. Hepatobiliary: No focal liver abnormality is seen. Status post cholecystectomy. No biliary dilatation. Pancreas: Mild atrophy. No ductal dilatation or surrounding inflammatory changes. Spleen: Normal in size without focal abnormality. Adrenals/Urinary Tract: The adrenal glands are unremarkable. There are 2 simple cysts in the lower pole of the right kidney measuring up to 1.6 cm. No renal or ureteral calculi. No hydronephrosis. The bladder is decompressed. Stomach/Bowel: Multiple dilated loops of small bowel are noted with a transition point in a left femoral hernia. No wall thickening or pneumatosis. The stomach is distended. The large bowel  is decompressed. Bowel sutures are noted in the distal sigmoid colon. The appendix is normal. Vascular/Lymphatic: Aortic atherosclerosis. No enlarged abdominal or pelvic lymph nodes. Reproductive: Uterus and bilateral adnexa are unremarkable. Other: No free fluid or pneumoperitoneum. Musculoskeletal: No acute or significant osseous findings. T11 and T12 compression deformities status post vertebroplasty. Prior left proximal femur ORIF. IMPRESSION: 1. Left femoral hernia containing incarcerated small  bowel with resultant high-grade obstruction. No wall thickening, pneumatosis, or free fluid. 2.  Aortic atherosclerosis (ICD10-I70.0). Electronically Signed   By: Obie DredgeWilliam T Derry M.D.   On: 07/03/2017 13:47   Dg Chest 2 View  Result Date: 07/03/2017 CLINICAL DATA:  Hypoxia, recent fall EXAM: CHEST  2 VIEW COMPARISON:  08/23/2016 FINDINGS: Cardiac shadow is at the upper limits of normal in size. Aortic calcifications are noted. Changes of prior vertebral augmentation are seen. Prior healed right clavicle and right rib fractures are seen. No focal infiltrate or sizable effusion is seen. No pneumothorax is noted. Old non treated compression fractures are noted in the upper and lower thoracic spine stable from the prior study. IMPRESSION: No acute abnormality noted.  Chronic changes as described above. Electronically Signed   By: Alcide CleverMark  Lukens M.D.   On: 07/03/2017 10:31   Dg Abd Portable 1v  Result Date: 07/03/2017 CLINICAL DATA:  Hernia repair, nasogastric tube positioning. EXAM: PORTABLE ABDOMEN - 1 VIEW COMPARISON:  CT abdomen 07/03/2017 FINDINGS: The nasogastric tube enters the stomach and its curvature would suggest positioning in the stomach body. Lower thoracic vertebral augmentations. Old bilateral rib fractures with callus formation. Indistinct airspace opacities medially at the left lung base. IMPRESSION: 1. Nasogastric tube tip is in the stomach body. 2. Left basilar airspace opacities are nonspecific but pneumonia is not excluded. Electronically Signed   By: Gaylyn RongWalter  Liebkemann M.D.   On: 07/03/2017 18:08    Anti-infectives: Anti-infectives    Start     Dose/Rate Route Frequency Ordered Stop   07/04/17 0600  cefoTEtan in Dextrose 5% (CEFOTAN) IVPB 2 g  Status:  Discontinued     2 g Intravenous On call to O.R. 07/03/17 1438 07/03/17 1525   07/04/17 0600  cefoTEtan in Dextrose 5% (CEFOTAN) IVPB 2 g    Comments:  She already has this ordered.  Do not give twice.   2 g Intravenous On call to  O.R. 07/03/17 1525 07/03/17 1610   07/03/17 2200  metroNIDAZOLE (FLAGYL) tablet 500 mg     500 mg Oral 3 times daily 07/03/17 2103       Assessment/Plan HTN - home meds once NG is out HLD - home meds once NG is out AKI on CKD IV - BUN/creatinine normalized s/p IVF C.dif colitis - PO flagyl  Ulcerative colitis - Mesalamine  Hyperglycemia - A1c 5.8% (pre-diabetes range)  Normocytic anemia - H&H 10.7/31 from 14/40 , follow   Incarcerated left femoral hernia POD#1 S/P HERNIA REPAIR FEMORAL 07/03/17 Dr. Abigail Miyamotoouglas Blackman - afebrile, VSS, some hypotension this AM (90/50) - NGT 450 cc/24h, continue today and await further bowel function - decrease narcotic meds to minimize confusion, will start IV tylenol - PT/OT eval   FEN: NPO, IVF, hypokalemia (3.4),  ID: perioperative cefotetan 10/31 VTE: SCD's, Lovenox  Follow up: Dr. Abigail Miyamotoouglas Blackman  Foley: continue today, plan to D/C tomorrow POD#2   LOS: 1 day    Adam PhenixElizabeth S Nazaire Cordial , [facility] 07/04/2017, 9:53 AM Pager: [phone] Consults: [phone] Mon-Fri 7:00 am-4:30 pm Sat-Sun 7:00 am-11:30 am

## 2017-07-04 NOTE — Evaluation (Signed)
Physical Therapy Evaluation Patient Details Name: Renee Fritz MRN: [id number] DOB: 12-08-1925 Today's Date: 07/04/2017   History of Present Illness  Pt is 83 y.o. female s/p incarcerated left femoral hernia repair with SBO. PMH includes ulcerative colitis, HTN, CAD, recenulcerative colitis, HTN, and CAD.  Clinical Impression  Patient presents with generalized weakness, deconditioning, impaired balance and impaired mobility s/p above. Tolerated gait training with Min A for balance/safety. Might benefit from use of RW next session. Pt Mod I PTA using SPC and daughter plans to stay with pt at d/c for 1 week. VSS throughout session. Will follow acutely to maximize independence and mobility prior to return home.     Follow Up Recommendations Home health PT;Supervision for mobility/OOB    Equipment Recommendations  None recommended by PT    Recommendations for Other Services       Precautions / Restrictions Precautions Precautions: Fall Precaution Comments: NG tube to suction Restrictions Weight Bearing Restrictions: No      Mobility  Bed Mobility Overal bed mobility: Needs Assistance Bed Mobility: Rolling;Sidelying to Sit Rolling: Min guard Sidelying to sit: Min assist;HOB elevated       General bed mobility comments: Assist to elevate trunk to get to EOB. No dizziness.   Transfers Overall transfer level: Needs assistance Equipment used: 1 person hand held assist Transfers: Sit to/from Stand Sit to Stand: Min assist         General transfer comment: Assist to power to standing with cues for anterior weight shift. Stood from [employer], transferred to chair post ambulation.  Ambulation/Gait Ambulation/Gait assistance: Min assist Ambulation Distance (Feet): 150 Feet Assistive device: 1 person hand held assist Gait Pattern/deviations: Step-to pattern;Step-through pattern;Decreased stride length;Narrow base of support Gait velocity: decreased   General Gait Details: Slow,  mildly unsteady gait with hand held assist for support. Might benefit from RW. VSS throughout. Sp02 remained >92% on RA. 2/4 DOE.  Stairs            Wheelchair Mobility    Modified Rankin (Stroke Patients Only)       Balance Overall balance assessment: Needs assistance Sitting-balance support: Feet supported;No upper extremity supported Sitting balance-Leahy Scale: Good     Standing balance support: During functional activity;Single extremity supported Standing balance-Leahy Scale: Poor Standing balance comment: Requires UE support for safety/balance in static and dynamic standing.                             Pertinent Vitals/Pain Pain Assessment: Faces Faces Pain Scale: Hurts a little bit Pain Location: surgical site Pain Descriptors / Indicators: Operative site guarding;Sore Pain Intervention(s): Monitored during session;Repositioned    Home Living Family/patient expects to be discharged to:: Private residence Living Arrangements: Alone Available Help at Discharge: Family;Available 24 hours/day (daughter will stay with her for 1 week post dc) Type of Home: House Home Access: Stairs to enter Entrance Stairs-Rails: Right;Left Entrance Stairs-Number of Steps: 4 vs 1 Home Layout: Two level;Bed/bath upstairs Home Equipment: Walker - 2 wheels;Cane - single point      Prior Function Level of Independence: Independent with assistive device(s)         Comments: Uses SPC for ambulation. Drives. Active. Exercises.      Hand Dominance   Dominant Hand: Right    Extremity/Trunk Assessment   Upper Extremity Assessment Upper Extremity Assessment: Defer to OT evaluation    Lower Extremity Assessment Lower Extremity Assessment: Generalized weakness    Cervical / Trunk  Assessment Cervical / Trunk Assessment: Kyphotic  Communication   Communication: No difficulties  Cognition Arousal/Alertness: Awake/alert Behavior During Therapy: WFL for tasks  assessed/performed Overall Cognitive Status: Impaired/Different from baseline Area of Impairment: Memory                     Memory: Decreased short-term memory         General Comments: Adamant that she had another surgery today despite being told that she already had surgery. MIld confusion noted but otherwise A&Ox3.      General Comments General comments (skin integrity, edema, etc.): VSS throughout.    Exercises     Assessment/Plan    PT Assessment Patient needs continued PT services  PT Problem List Decreased strength;Decreased mobility;Decreased cognition;Decreased balance;Pain;Cardiopulmonary status limiting activity       PT Treatment Interventions Therapeutic activities;Gait training;Therapeutic exercise;Patient/family education;Functional mobility training;Balance training;DME instruction    PT Goals (Current goals can be found in the Care Plan section)  Acute Rehab PT Goals Patient Stated Goal: to go home PT Goal Formulation: With patient Time For Goal Achievement: 07/18/17 Potential to Achieve Goals: Good    Frequency Min 3X/week   Barriers to discharge Decreased caregiver support lives alone but will have daughter for 1 week.     Co-evaluation               AM-PAC PT "6 Clicks" Daily Activity  Outcome Measure Difficulty turning over in bed (including adjusting bedclothes, sheets and blankets)?: Unable Difficulty moving from lying on back to sitting on the side of the bed? : Unable Difficulty sitting down on and standing up from a chair with arms (e.g., wheelchair, bedside commode, etc,.)?: Unable Help needed moving to and from a bed to chair (including a wheelchair)?: A Little Help needed walking in hospital room?: A Little Help needed climbing 3-5 steps with a railing? : A Lot 6 Click Score: 11    End of Session Equipment Utilized During Treatment: Gait belt;Other (comment) (NG tube) Activity Tolerance: Patient tolerated treatment  well Patient left: in chair;with call bell/phone within reach;with family/visitor present Nurse Communication: Mobility status PT Visit Diagnosis: Unsteadiness on feet (R26.81);Muscle weakness (generalized) (M62.81);Other abnormalities of gait and mobility (R26.89)    Time: 7829-56211407-1433 PT Time Calculation (min) (ACUTE ONLY): 26 min   Charges:   PT Evaluation $PT Eval Moderate Complexity: 1 Mod PT Treatments $Gait Training: 8-22 mins   PT G Codes:        Mylo RedShauna Jonavan Vanhorn, PT, DPT [phone]    Blake DivineShauna A Jesalyn Finazzo 07/04/2017, 3:43 PM

## 2017-07-04 NOTE — Progress Notes (Signed)
Patient ambulated well in the hall without difficulty tiring at the end.  Patient tolerated sitting up in her chair without difficulty for about 2 hrs at the bedside.  Children in at the bedside most of the day extremely supportive and informative to he patient and staff.  Patient is excited about possibly  going home soon and getting back in the routine of shoping at the local [employer] store. Patient does get confused at times however, is easily reoriented.

## 2017-07-04 NOTE — Anesthesia Postprocedure Evaluation (Signed)
Anesthesia Post Note  Patient: Renee JewelsDorothy Fritz  Procedure(s) Performed: HERNIA REPAIR FEMORAL (Left Groin)     Patient location during evaluation: PACU Anesthesia Type: General Level of consciousness: awake and alert Pain management: pain level controlled Vital Signs Assessment: post-procedure vital signs reviewed and stable Respiratory status: spontaneous breathing, nonlabored ventilation, respiratory function stable and patient connected to nasal cannula oxygen Cardiovascular status: blood pressure returned to baseline and stable Postop Assessment: no apparent nausea or vomiting Anesthetic complications: no    Last Vitals:  Vitals:   07/04/17 0600 07/04/17 0700  BP: (!) 90/51   Pulse: 60   Resp: 14   Temp:  36.6 C  SpO2: 100%     Last Pain:  Vitals:   07/04/17 0700  TempSrc: Oral  PainSc:    Pain Goal:                 Cecile HearingStephen Edward Tomiko Schoon

## 2017-07-05 LAB — BASIC METABOLIC PANEL
ANION GAP: 7 (ref 5–15)
BUN: 9 mg/dL (ref 6–20)
CHLORIDE: 105 mmol/L (ref 101–111)
CO2: 27 mmol/L (ref 22–32)
Calcium: 8.2 mg/dL — ABNORMAL LOW (ref 8.9–10.3)
Creatinine, Ser: 0.6 mg/dL (ref 0.44–1.00)
GFR calc Af Amer: >60 mL/min (ref >60)
GFR calc non Af Amer: >60 mL/min (ref >60)
GLUCOSE: 131 mg/dL — AB (ref 65–99)
POTASSIUM: 4.2 mmol/L (ref 3.5–5.1)
Sodium: 139 mmol/L (ref 135–145)

## 2017-07-05 LAB — GLUCOSE, CAPILLARY
GLUCOSE-CAPILLARY: 90 mg/dL (ref 65–99)
GLUCOSE-CAPILLARY: 58 mg/dL — AB (ref 65–99)
GLUCOSE-CAPILLARY: 89 mg/dL (ref 65–99)
GLUCOSE-CAPILLARY: 73 mg/dL (ref 65–99)
Glucose-Capillary: 115 mg/dL — ABNORMAL HIGH (ref 65–99)
Glucose-Capillary: 119 mg/dL — ABNORMAL HIGH (ref 65–99)
Glucose-Capillary: 77 mg/dL (ref 65–99)

## 2017-07-05 LAB — CBC WITH DIFFERENTIAL/PLATELET
BASOS ABS: 0 10*3/uL (ref 0.0–0.1)
Basophils Relative: 0 %
EOS PCT: 0 %
Eosinophils Absolute: 0 10*3/uL (ref 0.0–0.7)
HEMATOCRIT: 30.8 % — AB (ref 36.0–46.0)
Hemoglobin: 10.1 g/dL — ABNORMAL LOW (ref 12.0–15.0)
LYMPHS ABS: 1.6 10*3/uL (ref 0.7–4.0)
LYMPHS PCT: 18 %
MCH: 29.9 pg (ref 26.0–34.0)
MCHC: 32.8 g/dL (ref 30.0–36.0)
MCV: 91.1 fL (ref 78.0–100.0)
MONO ABS: 0.8 10*3/uL (ref 0.1–1.0)
Monocytes Relative: 9 %
NEUTROS ABS: 6.7 10*3/uL (ref 1.7–7.7)
Neutrophils Relative %: 73 %
PLATELETS: 235 10*3/uL (ref 150–400)
RBC: 3.38 MIL/uL — AB (ref 3.87–5.11)
RDW: 15.5 % (ref 11.5–15.5)
WBC: 9.1 10*3/uL (ref 4.0–10.5)

## 2017-07-05 LAB — URINE CULTURE: Culture: >=100000 — AB

## 2017-07-05 LAB — C DIFFICILE QUICK SCREEN W PCR REFLEX
C DIFFICILE (CDIFF) INTERP: NOT DETECTED
C DIFFICILE (CDIFF) TOXIN: NEGATIVE
C Diff antigen: NEGATIVE

## 2017-07-05 MED ORDER — ACETAMINOPHEN 10 MG/ML IV SOLN
1000.0000 mg | Freq: Two times a day (BID) | INTRAVENOUS | Status: AC
  Administered 2017-07-05 – 2017-07-06 (×2): 1000 mg via INTRAVENOUS
  Filled 2017-07-05 (×2): qty 100

## 2017-07-05 MED ORDER — SODIUM CHLORIDE 0.9 % IV SOLN
INTRAVENOUS | Status: DC
  Administered 2017-07-05: 75 mL/h via INTRAVENOUS
  Administered 2017-07-06: 04:00:00 via INTRAVENOUS

## 2017-07-05 NOTE — Progress Notes (Signed)
Physical Therapy Treatment Patient Details Name: Renee Fritz MRN: [id number] DOB: [birth year] Today's Date: 07/05/2017    History of Present Illness (P) Pt is 83 y.o. female s/p incarcerated left femoral hernia repair with SBO. PMH includes ulcerative colitis, HTN, CAD, recenulcerative colitis, HTN, and CAD.    PT Comments    Pt continues to demonstrate generalized muscular weakness and deconditioning as well as balance impairments. Pt's SpO2 ranged from 84%-92% on 2L O2 during session. Pt c/o SOB during ambulation with 2-wheel walker and min guard assist, requiring 2 rest breaks in 150-feet. Pt continues to have short-term memory deficits regarding day of surgery. Pt remains good candidate for skilled PT in order to improve functional mobility status and return to prior level of function.   Follow Up Recommendations  Home health PT;Supervision for mobility/OOB     Equipment Recommendations  Other (comment)  Shower chair   Recommendations for Other Services       Precautions / Restrictions Precautions Precautions: (P) Fall Precaution Comments: (P) NG tube Restrictions Weight Bearing Restrictions: No    Mobility  Bed Mobility Overal bed mobility: Modified Independent Bed Mobility: Rolling;Sidelying to Sit Rolling: Modified independent (Device/Increase time) Sidelying to sit: Supervision;Modified independent (Device/Increase time)       General bed mobility comments: HOB elevated  Transfers Overall transfer level: Needs assistance Equipment used: Rolling walker (2 wheeled) Transfers: Sit to/from Stand Sit to Stand: Min guard         General transfer comment: Cueing for hand placement - 1 on bed, 1 on walker. stand from EOB x 1, sit in chair x 1 post ambulation  Ambulation/Gait Ambulation/Gait assistance: Min guard Ambulation Distance (Feet): 150 Feet Assistive device: Rolling walker (2 wheeled) Gait Pattern/deviations: Step-through pattern;Narrow base of  support;Decreased stride length Gait velocity: decreased   General Gait Details: Pt's SpO2 ranged from 84-92% on 2L O2 during ambulation. 2 standing rest breaks needed. 2/4 DOE.   Stairs            Wheelchair Mobility    Modified Rankin (Stroke Patients Only)       Balance Overall balance assessment: Needs assistance Sitting-balance support: Feet unsupported;Single extremity supported Sitting balance-Leahy Scale: Good     Standing balance support: Bilateral upper extremity supported Standing balance-Leahy Scale: Poor Standing balance comment: Requires UE support for safety/balance in static and dynamic standing.                            Cognition Arousal/Alertness: (P) Awake/alert Behavior During Therapy: (P) WFL for tasks assessed/performed;Impulsive Overall Cognitive Status: (P) Within Functional Limits for tasks assessed Area of Impairment: Memory                     Memory: Decreased short-term memory         General Comments: (P) WFL for basic info       Exercises      General Comments General comments (skin integrity, edema, etc.): Pt on 2L O2 with SpO2 ranging from 84-92% during session. Pt educated on benefits of shower chair, as pt is currently holding on to railing when showering.      Pertinent Vitals/Pain Pain Assessment: (P) No/denies pain    Home Living Family/patient expects to be discharged to:: (P) Private residence Living Arrangements: (P) Alone Available Help at Discharge: (P) Family;Available 24 hours/day Type of Home: (P) House Home Access: (P) Stairs to enter Entrance Stairs-Rails: (P) Right;Left Home Layout: (P)  Two level;Bed/bath upstairs Home Equipment: (P) Walker - 2 wheels;Cane - single point;Grab bars - tub/shower      Prior Function Level of Independence: (P) Independent with assistive device(s)      Comments: (P) Uses SPC for ambulation. Drives. Active. Exercises.    PT Goals (current goals can  now be found in the care plan section)      Frequency    Min 3X/week      PT Plan Current plan remains appropriate    Co-evaluation              AM-PAC PT "6 Clicks" Daily Activity  Outcome Measure  Difficulty turning over in bed (including adjusting bedclothes, sheets and blankets)?: None Difficulty moving from lying on back to sitting on the side of the bed? : A Little Difficulty sitting down on and standing up from a chair with arms (e.g., wheelchair, bedside commode, etc,.)?: A Little Help needed moving to and from a bed to chair (including a wheelchair)?: A Little Help needed walking in hospital room?: A Little Help needed climbing 3-5 steps with a railing? : A Lot 6 Click Score: 18    End of Session Equipment Utilized During Treatment: Gait belt Activity Tolerance: Patient tolerated treatment well;Other (comment) (Patient limited by SpO2 drop and SOB) Patient left: in chair;with family/visitor present;with call bell/phone within reach Nurse Communication: Mobility status PT Visit Diagnosis: Unsteadiness on feet (R26.81);Muscle weakness (generalized) (M62.81);Other abnormalities of gait and mobility (R26.89)     Time: [id number] PT Time Calculation (min) (ACUTE ONLY): 32 min  Charges:  $Gait Training: 8-22 mins $Therapeutic Activity: 8-22 mins                    G Codes:       Reina Fuserin Sally-Anne Wamble, SPT   Reina Fuserin Khushboo Chuck 07/05/2017, 12:09 PM

## 2017-07-05 NOTE — Care Management Note (Signed)
Case Management Note  Patient Details  Name: Renee Fritz MRN: [id number] Date of Birth: February 20, 1926  Subjective/Objective:    From home alone, presents with incarcerated hernia, POD 1 hernia repair, walking, had a BM, NG tube clamped today, per pt eval rec HHPT, [facility], soc will begin 24-48 hrs post dc .  To coordinate [facility] services please call daughter Okey RegalCarol at [phone].  Per son Jeannett SeniorStephen , his older sister will be with patient on Sunday, and the younger sister will be with patient on Mon and Tues and his brother [place] and he will also be with patient.  Jeannett SeniorStephen states he has a rolling walker from his Dad if she needs it and also he has a shower chair that she can use.                  Action/Plan: DC home with HHPT with [facility].  Expected Discharge Date:  07/06/17               Expected Discharge Plan:  Home w Home Health Services  In-House Referral:     Discharge planning Services  CM Consult  Post Acute Care Choice:  Home Health Choice offered to:  Adult Children  DME Arranged:    DME Agency:     HH Arranged:  PT HH Agency:  [facility]  Status of Service:  Completed, signed off  If discussed at Long Length of Stay Meetings, dates discussed:    Additional Comments:  Leone Havenaylor, Lurdes Haltiwanger [place], RN 07/05/2017, 4:34 PM

## 2017-07-05 NOTE — Evaluation (Signed)
Occupational Therapy Evaluation Patient Details Name: Renee Fritz MRN: [id number] DOB: August 15, 1926 Today's Date: 07/05/2017    History of Present Illness Pt is 83 y.o. female s/p incarcerated left femoral hernia repair with SBO. PMH includes ulcerative colitis, HTN, CAD, recenulcerative colitis, HTN, and CAD.   Clinical Impression   Pt admitted with above. She demonstrates the below listed deficits and will benefit from continued OT to maximize safety and independence with BADLs.  Pt presents to OT with generalized weakness, decreased activity tolerance, impaired balance.  She currently requires supervision - min A for ADLs.  She lived alone PTA, and was independent.  Her daughter is planning to stay with her at discharge and can assist as needed.  Will follow acutely.       Follow Up Recommendations  No OT follow up;Supervision/Assistance - 24 hour    Equipment Recommendations  None recommended by OT    Recommendations for Other Services       Precautions / Restrictions Precautions Precautions: Fall Precaution Comments: NG tube Restrictions Weight Bearing Restrictions: No      Mobility Bed Mobility Overal bed mobility: Needs Assistance Bed Mobility: Sit to Supine Rolling: Modified independent (Device/Increase time) Sidelying to sit: Supervision;Modified independent (Device/Increase time)   Sit to supine: Min assist   General bed mobility comments: assist to lift LEs onto bed   Transfers Overall transfer level: Needs assistance Equipment used: Rolling walker (2 wheeled) Transfers: Sit to/from [employer] Sit to Stand: Min guard         General transfer comment: Cueing for hand placement - 1 on bed, 1 on walker. stand from EOB x 1, sit in chair x 1 post ambulation    Balance Overall balance assessment: Needs assistance Sitting-balance support: Feet unsupported;Single extremity supported Sitting balance-Leahy Scale: Good     Standing balance  support: Bilateral upper extremity supported Standing balance-Leahy Scale: Poor Standing balance comment: Requires UE support for safety/balance in static and dynamic standing.                           ADL either performed or assessed with clinical judgement   ADL Overall ADL's : Needs assistance/impaired Eating/Feeding: NPO   Grooming: Wash/dry hands;Wash/dry face;Oral care;Brushing hair;Min guard;Standing   Upper Body Bathing: Set up;Sitting   Lower Body Bathing: Sit to/from stand;Minimal assistance   Upper Body Dressing : Set up;Sitting   Lower Body Dressing: Sit to/from stand;Minimal assistance   Toilet Transfer: Moderate assistance;Ambulation;Comfort height toilet;BSC;Grab bars;RW           Functional mobility during ADLs: Minimal assistance;Rolling walker General ADL Comments: assist for balance and for peri area      Vision Patient Visual Report: No change from baseline       Perception     Praxis      Pertinent Vitals/Pain Pain Assessment: No/denies pain     Hand Dominance Right   Extremity/Trunk Assessment Upper Extremity Assessment Upper Extremity Assessment: Generalized weakness   Lower Extremity Assessment Lower Extremity Assessment: Defer to PT evaluation   Cervical / Trunk Assessment Cervical / Trunk Assessment: Kyphotic   Communication Communication Communication: No difficulties   Cognition Arousal/Alertness: Awake/alert Behavior During Therapy: WFL for tasks assessed/performed;Impulsive Overall Cognitive Status: Within Functional Limits for tasks assessed Area of Impairment: Memory                     Memory: Decreased short-term memory  General Comments: WFL for basic info    General Comments  02 sats remaining >94% during session.  Pt frequently with poor waveform and inaccurate reading from probe    Exercises     Shoulder Instructions      Home Living Family/patient expects to be discharged  to:: Private residence Living Arrangements: Alone Available Help at Discharge: Family;Available 24 hours/day Type of Home: House Home Access: Stairs to enter Enter[employer] CorporationEntrance Stairs-Number of Steps: 4 vs 1 Entrance Stairs-Rails: Right;Left Home Layout: Two level;Bed/bath upstairs Alternate Level Stairs-Number of Steps: 1 flight Alternate Level Stairs-Rails: Right Bathroom Shower/Tub: Tub/shower unit;Door   Foot LockerBathroom Toilet: Standard     Home Equipment: Environmental consultantWalker - 2 wheels;Cane - single point;Grab bars - tub/shower          Prior Functioning/Environment Level of Independence: Independent with assistive device(s)        Comments: Uses SPC for ambulation. Drives. Active. Exercises.         OT Problem List: Decreased strength;Decreased activity tolerance;Impaired balance (sitting and/or standing);Decreased safety awareness;Decreased knowledge of use of DME or AE      OT Treatment/Interventions: Self-care/ADL training;DME and/or AE instruction;Therapeutic activities;Patient/family education;Balance training;Energy conservation    OT Goals(Current goals can be found in the care plan section) Acute Rehab OT Goals Patient Stated Goal: to go home OT Goal Formulation: With patient Time For Goal Achievement: 07/19/17 Potential to Achieve Goals: Good ADL Goals Pt Will Perform Grooming: with modified independence;standing Pt Will Perform Upper Body Bathing: with modified independence;sitting Pt Will Perform Lower Body Bathing: with modified independence;sit to/from stand Pt Will Perform Upper Body Dressing: with modified independence;sitting Pt Will Perform Lower Body Dressing: with modified independence;sit to/from stand Pt Will Transfer to Toilet: with modified independence;ambulating;regular height toilet;bedside commode;grab bars Pt Will Perform Toileting - Clothing Manipulation and hygiene: with modified independence;sit to/from stand Pt Will Perform Tub/Shower Transfer: Tub  transfer;with min guard assist;ambulating;grab bars;rolling walker  OT Frequency: Min 2X/week   Barriers to D/C:            Co-evaluation              AM-PAC PT "6 Clicks" Daily Activity     Outcome Measure Help from another person eating meals?: None Help from another person taking care of personal grooming?: A Little Help from another person toileting, which includes using toliet, bedpan, or urinal?: A Little Help from another person bathing (including washing, rinsing, drying)?: A Little Help from another person to put on and taking off regular upper body clothing?: A Little Help from another person to put on and taking off regular lower body clothing?: A Little 6 Click Score: 19   End of Session Equipment Utilized During Treatment: Rolling walker Nurse Communication: Mobility status  Activity Tolerance: Patient tolerated treatment well Patient left: in bed;with call bell/phone within reach;with bed alarm set  OT Visit Diagnosis: Unsteadiness on feet (R26.81);Muscle weakness (generalized) (M62.81)                Time: 1102-1200 OT Time Calculation (min): 58 min Charges:  OT General Charges $OT Visit: 1 Visit OT Evaluation $OT Eval Moderate Complexity: 1 Mod OT Treatments $Self Care/Home Management : 8-22 mins $Therapeutic Activity: 23-37 mins G-Codes:     Reynolds AmericanWendi Kyelle Urbas, OTR/L [phone]   Jeani HawkingConarpe, Jahmia Berrett M 07/05/2017, 12:15 PM

## 2017-07-05 NOTE — Progress Notes (Signed)
[facility] Progress Note  2 Days Post-Op  Subjective: CC:  Denies BM but has one documented in epic. Less confused today. Mobilized with PT today. Denies nausea. Denies flatus. C/p sore throat from NG.  Urine culture and sensitivities returned suggesting UTI. Patient reports she gets recurrent UTIs and takes abx frequently.  Objective: Vital signs in last 24 hours: Temp:  [97.7 F (36.5 C)-98.2 F (36.8 C)] 97.8 F (36.6 C) (11/02 0745) Pulse Rate:  [73-93] 73 (11/02 0300) Resp:  [16-23] 18 (11/02 0745) BP: (126-134)/(59-97) 127/97 (11/02 0745) SpO2:  [94 %-97 %] 97 % (11/02 0745) Last BM Date: 07/03/17  Intake/Output from previous day: 11/01 0701 - 11/02 0700 In: 3425 [I.V.:3000; IV Piggyback:200] Out: 1025 [Urine:750; Emesis/NG output:275] Intake/Output this shift: No intake/output data recorded.  PE: Gen:  Alert, NAD, pleasant Card:  Regular rate and rhythm, pedal pulses 2+ BL Pulm:  Normal effort, clear to auscultation bilaterally Abd: Soft, non-tender, non-distended, great bowel sounds in all 4 quadrants, LLQ Incision C/D/I  NGT 275 cc/24h, stool x 1 documented in epic  GU: UOP 750 cc/24h  Skin: warm and dry, no rashes  Psych: A&Ox3   Lab Results:   Recent Labs  07/04/17 0237 07/04/17 0756 07/05/17 0522  WBC 7.8  --  9.1  HGB 10.6* 10.7* 10.1*  HCT 31.1* 30.4* 30.8*  PLT 205  --  235   BMET  Recent Labs  07/04/17 0237 07/05/17 0522  NA 133* 139  K 3.4* 4.2  CL 93* 105  CO2 29 27  GLUCOSE 219* 131*  BUN 18 9  CREATININE 0.99 0.60  CALCIUM 8.3* 8.2*   PT/INR No results for input(s): LABPROT, INR in the last 72 hours. CMP     Component Value Date/Time   NA 139 07/05/2017 0522   K 4.2 07/05/2017 0522   CL 105 07/05/2017 0522   CO2 27 07/05/2017 0522   GLUCOSE 131 (H) 07/05/2017 0522   BUN 9 07/05/2017 0522   CREATININE 0.60 07/05/2017 0522   CALCIUM 8.2 (L) 07/05/2017 0522   PROT 6.0 (L) 07/04/2017 0237   ALBUMIN 3.1 (L)  07/04/2017 0237   AST 26 07/04/2017 0237   ALT 15 07/04/2017 0237   ALKPHOS 33 (L) 07/04/2017 0237   BILITOT 0.5 07/04/2017 0237   GFRNONAA >60 07/05/2017 0522   GFRAA >60 07/05/2017 0522   Lipase  No results found for: LIPASE     Studies/Results: Ct Abdomen Pelvis Wo Contrast  Result Date: 07/03/2017 CLINICAL DATA:  Nausea and vomiting for 3 days. EXAM: CT ABDOMEN AND PELVIS WITHOUT CONTRAST TECHNIQUE: Multidetector CT imaging of the abdomen and pelvis was performed following the standard protocol without IV contrast. COMPARISON:  None. FINDINGS: Lower chest: Bibasilar atelectasis/ scarring. Hepatobiliary: No focal liver abnormality is seen. Status post cholecystectomy. No biliary dilatation. Pancreas: Mild atrophy. No ductal dilatation or surrounding inflammatory changes. Spleen: Normal in size without focal abnormality. Adrenals/Urinary Tract: The adrenal glands are unremarkable. There are 2 simple cysts in the lower pole of the right kidney measuring up to 1.6 cm. No renal or ureteral calculi. No hydronephrosis. The bladder is decompressed. Stomach/Bowel: Multiple dilated loops of small bowel are noted with a transition point in a left femoral hernia. No wall thickening or pneumatosis. The stomach is distended. The large bowel is decompressed. Bowel sutures are noted in the distal sigmoid colon. The appendix is normal. Vascular/Lymphatic: Aortic atherosclerosis. No enlarged abdominal or pelvic lymph nodes. Reproductive: Uterus and bilateral adnexa are unremarkable.  Other: No free fluid or pneumoperitoneum. Musculoskeletal: No acute or significant osseous findings. T11 and T12 compression deformities status post vertebroplasty. Prior left proximal femur ORIF. IMPRESSION: 1. Left femoral hernia containing incarcerated small bowel with resultant high-grade obstruction. No wall thickening, pneumatosis, or free fluid. 2.  Aortic atherosclerosis (ICD10-I70.0). Electronically Signed   By: Obie DredgeWilliam T  Derry M.D.   On: 07/03/2017 13:47   Dg Chest 2 View  Result Date: 07/03/2017 CLINICAL DATA:  Hypoxia, recent fall EXAM: CHEST  2 VIEW COMPARISON:  08/23/2016 FINDINGS: Cardiac shadow is at the upper limits of normal in size. Aortic calcifications are noted. Changes of prior vertebral augmentation are seen. Prior healed right clavicle and right rib fractures are seen. No focal infiltrate or sizable effusion is seen. No pneumothorax is noted. Old non treated compression fractures are noted in the upper and lower thoracic spine stable from the prior study. IMPRESSION: No acute abnormality noted.  Chronic changes as described above. Electronically Signed   By: Alcide CleverMark  Lukens M.D.   On: 07/03/2017 10:31   Dg Abd Portable 1v  Result Date: 07/03/2017 CLINICAL DATA:  Hernia repair, nasogastric tube positioning. EXAM: PORTABLE ABDOMEN - 1 VIEW COMPARISON:  CT abdomen 07/03/2017 FINDINGS: The nasogastric tube enters the stomach and its curvature would suggest positioning in the stomach body. Lower thoracic vertebral augmentations. Old bilateral rib fractures with callus formation. Indistinct airspace opacities medially at the left lung base. IMPRESSION: 1. Nasogastric tube tip is in the stomach body. 2. Left basilar airspace opacities are nonspecific but pneumonia is not excluded. Electronically Signed   By: Gaylyn RongWalter  Liebkemann M.D.   On: 07/03/2017 18:08    Anti-infectives: Anti-infectives    Start     Dose/Rate Route Frequency Ordered Stop   07/04/17 0600  cefoTEtan in Dextrose 5% (CEFOTAN) IVPB 2 g  Status:  Discontinued     2 g Intravenous On call to O.R. 07/03/17 1438 07/03/17 1525   07/04/17 0600  cefoTEtan in Dextrose 5% (CEFOTAN) IVPB 2 g    Comments:  She already has this ordered.  Do not give twice.   2 g Intravenous On call to O.R. 07/03/17 1525 07/03/17 1610   07/03/17 2200  metroNIDAZOLE (FLAGYL) tablet 500 mg     500 mg Oral 3 times daily 07/03/17 2103       Assessment/Plan HTN - home meds  once NG is out HLD - home meds once NG is out AKI on CKD IV - BUN/creatinine normalized s/p IVF C.dif colitis - results pending   Ulcerative colitis - Mesalamine  Hyperglycemia - A1c 5.8% (pre-diabetes range)   Normocytic anemia - H&H 10.7/31 from 14/40 , follow   Incarcerated left femoral hernia POD#1 S/P HERNIA REPAIR FEMORAL 07/03/17 Dr. Abigail Miyamotoouglas Blackman - afebrile, VSS, some hypotension this AM (90/50) - NGT 450 cc/24h, continue today and await further bowel function - decrease narcotic meds to minimize confusion, will start IV tylenol - PT/OT eval   FEN: NPO; NG tube clamping trial today -great bowel sounds, low NG output. Clamp and check residuals in 4 hours. ID: perioperative cefotetan 10/31; urine cx 10/31 growing citrobacter freundii; if passes clamping trial today can start PO abx if patient symptomatic from this. VTE: SCD's, Lovenox  Follow up: Dr. Abigail Miyamotoouglas Blackman  Foley: D/C Foley; self cath   LOS: 2 days    Renee Fritz , [facility] HospitalA-[facility] 07/05/2017, 9:48 AM Pager: [phone] Consults: [phone] Mon-Fri 7:00 am-4:30 pm Sat-Sun 7:00 am-11:30 am

## 2017-07-05 NOTE — Progress Notes (Signed)
PROGRESS NOTE  Renee Fritz ZOX:[id number] DOB: 1925-12-15 DOA: 07/03/2017 PCP: Renee Fritz., MD  HPI/Recap of past 24 hours: Renee Fritz was seen and examined. Sitting up in the chair. Denies abdominal pain. Admits to pain in her chest when she coughs and avoids it. No nausea. NG tube clamped. Admits to flatus and feels like she may have a bowel movement.  Assessment/Plan: Active Problems:   Incarcerated hernia   Incarcerated femoral hernia   AKI (acute kidney injury) (HCC)   Diarrhea   Hypokalemia   Intractable vomiting with nausea   S/P hernia repair   Code Status: Full  Family Communication: No family members at bedside today.  Disposition Plan: Will keep another midnight to continue treatment.   Consultants:  General surgery  Procedures:  Left inguinal hernia repair  Antimicrobials:  Po flagyl for c-diff; pt allergic to vancomycin   DVT prophylaxis:  Lovenox 40 mg sq daily  Objective: Vitals:   07/04/17 1200 07/04/17 1935 07/04/17 2300 07/05/17 0300  BP:  (!) 126/59 126/73 134/63  Pulse:  87 93 73  Resp:  (!) 23 (!) 21 16  Temp: 97.7 F (36.5 C) 98.2 F (36.8 C) 98.2 F (36.8 C) 98.2 F (36.8 C)  TempSrc: Oral Oral Oral Oral  SpO2:  97% 94% 96%  Weight:      Height:        Intake/Output Summary (Last 24 hours) at 07/05/17 0737 Last data filed at 07/05/17 0600  Gross per 24 hour  Intake             3425 ml  Output             1025 ml  Net             2400 ml   Filed Weights   07/03/17 1538 07/03/17 2100  Weight: 40.8 kg (90 lb) 43.8 kg (96 lb 9 oz)    Exam:   General:83 year old caucasian female, thin built, sitting up in a chair. Hard of hearing.  Eyes:Pupils are round and reactive to light  WJX:BJYNWG membranes are moist, no erythema or drainage. NG tube in place, clamped.  Neck:No JVD, or thyromegaly  Cardiovascular:RRR with no murmurs, rubs or gallops  Respiratory:Mild crackles at bases. No  wheezes.  Abdomen:+ bowel sounds. Left scaring from surgical incision located at left lower quadrant.  Skin:Scar noted at left lower quadrant of abdomen  Musculoskeletal:No noted muscular atrophy  Psychiatric:Mood is appropriate for condition and setting.  Neurologic:Alert and oriented x 3. No noted focal motor or sensory deficits.  Data Reviewed: CBC:  Recent Labs Lab 07/03/17 0936 07/04/17 0237 07/04/17 0756 07/05/17 0522  WBC 7.9 7.8  --  9.1  NEUTROABS 6.3  --   --  6.7  HGB 14.3 10.6* 10.7* 10.1*  HCT 40.6 31.1* 30.4* 30.8*  MCV 87.9 88.9  --  91.1  PLT 292 205  --  235   Basic Metabolic Panel:  Recent Labs Lab 07/03/17 0936 07/04/17 0237 07/05/17 0522  NA 129* 133* 139  K 2.9* 3.4* 4.2  CL 75* 93* 105  CO2 36* 29 27  GLUCOSE 127* 219* 131*  BUN 25* 18 9  CREATININE 1.99* 0.99 0.60  CALCIUM 11.0* 8.3* 8.2*   GFR: Estimated Creatinine Clearance: 33 mL/min (by C-G formula based on SCr of 0.6 mg/dL). Liver Function Tests:  Recent Labs Lab 07/03/17 0936 07/04/17 0237  AST 40 26  ALT 21 15  ALKPHOS 59 33*  BILITOT  1.3* 0.5  PROT 8.4* 6.0*  ALBUMIN 4.3 3.1*   No results for input(s): LIPASE, AMYLASE in the last 168 hours. No results for input(s): AMMONIA in the last 168 hours. Coagulation Profile: No results for input(s): INR, PROTIME in the last 168 hours. Cardiac Enzymes: No results for input(s): CKTOTAL, CKMB, CKMBINDEX, TROPONINI in the last 168 hours. BNP (last 3 results) No results for input(s): PROBNP in the last 8760 hours. HbA1C:  Recent Labs  07/04/17 0756  HGBA1C 5.8*   CBG:  Recent Labs Lab 07/04/17 1223 07/04/17 1643 07/04/17 1923 07/05/17 0006 07/05/17 0412  GLUCAP 133* 99 117* 115* 119*   Lipid Profile: No results for input(s): CHOL, HDL, LDLCALC, TRIG, CHOLHDL, LDLDIRECT in the last 72 hours. Thyroid Function Tests: No results for input(s): TSH, T4TOTAL, FREET4, T3FREE, THYROIDAB in the last 72 hours. Anemia  Panel: No results for input(s): VITAMINB12, FOLATE, FERRITIN, TIBC, IRON, RETICCTPCT in the last 72 hours. Urine analysis:    Component Value Date/Time   COLORURINE AMBER (A) 07/03/2017 0954   APPEARANCEUR CLOUDY (A) 07/03/2017 0954   LABSPEC 1.017 07/03/2017 0954   PHURINE 5.0 07/03/2017 0954   GLUCOSEU NEGATIVE 07/03/2017 0954   HGBUR NEGATIVE 07/03/2017 0954   BILIRUBINUR NEGATIVE 07/03/2017 0954   KETONESUR 5 (A) 07/03/2017 0954   PROTEINUR 100 (A) 07/03/2017 0954   NITRITE NEGATIVE 07/03/2017 0954   LEUKOCYTESUR SMALL (A) 07/03/2017 0954   Sepsis Labs: @LABRCNTIP (procalcitonin:4,lacticidven:4)  ) Recent Results (from the past 240 hour(s))  Urine culture     Status: Abnormal (Preliminary result)   Collection Time: 07/03/17 11:14 AM  Result Value Ref Range Status   Specimen Description URINE, RANDOM  Final   Special Requests NONE  Final   Culture >=100,000 COLONIES/mL GRAM NEGATIVE RODS (A)  Final   Report Status PENDING  Incomplete  MRSA PCR Screening     Status: Abnormal   Collection Time: 07/03/17  9:01 PM  Result Value Ref Range Status   MRSA by PCR POSITIVE (A) NEGATIVE Final    Comment:        The GeneXpert MRSA Assay (FDA approved for NASAL specimens only), is one component of a comprehensive MRSA colonization surveillance program. It is not intended to diagnose MRSA infection nor to guide or monitor treatment for MRSA infections. RESULT CALLED TO, READ BACK BY AND VERIFIED WITH: T.NELSON RN 07/03/17 2334 L.CHAMPION       Studies: No results found.  Scheduled Meds: . amLODipine  5 mg Oral Daily  . atenolol  100 mg Oral BID  . enoxaparin (LOVENOX) injection  30 mg Subcutaneous Q24H  . insulin aspart  0-9 Units Subcutaneous Q4H  . Mesalamine  800 mg Oral TID  . metroNIDAZOLE  500 mg Oral TID    Continuous Infusions: . acetaminophen Stopped (07/05/17 0534)  . dextrose 5 % and 0.9 % NaCl with KCl 40 mEq/L 125 mL/hr at 07/05/17 0518  . lactated  ringers Stopped (07/03/17 2100)     LOS: 2 days   Assessment/Plan  Left femoral hernia with incarcerated small bowel s/p repair POD#2 -General surgery, Dr Magnus Ivan following; we appreciate recommendations -Pain management IV tylenol 1000 mg BID for 1 day. Avoid narcotics. -NG tube; when NG tube removed, clear liquid then advance diet -Continue with close monitoring of symptoms and NG tube collection  AKI on CKD stage 4 -resolved; cr 0.60 -gentle IV fluid hydration with NS at 75 cc/hr  Elevated anion gap in the setting of elevated lactic acid and worsening  renal function -resolved -continue IV fluid hydration   Recent c-diff colitis with self reported recurrence x 2 -treated in the outpatient stating on po flagyl -allergic to vancomycin - continue po flagyl - c-diff pcr and enteric precaution in place.  Elevated lactic acid - improving  Hyponatremia/hypokalemia/hypomagnesemia -Most likely 2/2 to diarrhea -resolved -Repeat BMP in the am  Ulcerative colitis -continue mesalamine  HLD -conitnue simvastatin  HTN -continue amlodipine and atenolol  Deconditioning -OOB to chair -PT consulted; we appreciate recommendations.   Darlin Droparole N Desiraye Rolfson, MD [facility] Pager [phone]  If 7PM-7AM, please contact night-coverage [email or web address] Password [email or web address] 07/05/2017, 7:37 AM

## 2017-07-06 DIAGNOSIS — I480 Paroxysmal atrial fibrillation: Secondary | ICD-10-CM

## 2017-07-06 LAB — CBC
HEMATOCRIT: 34.8 % — AB (ref 36.0–46.0)
HEMOGLOBIN: 11.5 g/dL — AB (ref 12.0–15.0)
MCH: 30 pg (ref 26.0–34.0)
MCHC: 33 g/dL (ref 30.0–36.0)
MCV: 90.9 fL (ref 78.0–100.0)
Platelets: 244 10*3/uL (ref 150–400)
RBC: 3.83 MIL/uL — AB (ref 3.87–5.11)
RDW: 14.8 % (ref 11.5–15.5)
WBC: 11.3 10*3/uL — ABNORMAL HIGH (ref 4.0–10.5)

## 2017-07-06 LAB — BASIC METABOLIC PANEL
Anion gap: 8 (ref 5–15)
BUN: 8 mg/dL (ref 6–20)
CHLORIDE: 100 mmol/L — AB (ref 101–111)
CO2: 25 mmol/L (ref 22–32)
CREATININE: 0.65 mg/dL (ref 0.44–1.00)
Calcium: 7.5 mg/dL — ABNORMAL LOW (ref 8.9–10.3)
GFR calc Af Amer: >60 mL/min (ref >60)
GFR calc non Af Amer: >60 mL/min (ref >60)
GLUCOSE: 114 mg/dL — AB (ref 65–99)
POTASSIUM: 3.7 mmol/L (ref 3.5–5.1)
Sodium: 133 mmol/L — ABNORMAL LOW (ref 135–145)

## 2017-07-06 LAB — GLUCOSE, CAPILLARY
GLUCOSE-CAPILLARY: 110 mg/dL — AB (ref 65–99)
GLUCOSE-CAPILLARY: 123 mg/dL — AB (ref 65–99)
Glucose-Capillary: 127 mg/dL — ABNORMAL HIGH (ref 65–99)
Glucose-Capillary: 86 mg/dL (ref 65–99)
Glucose-Capillary: 87 mg/dL (ref 65–99)
Glucose-Capillary: 92 mg/dL (ref 65–99)

## 2017-07-06 LAB — HEPARIN LEVEL (UNFRACTIONATED)

## 2017-07-06 MED ORDER — HEPARIN BOLUS VIA INFUSION
1000.0000 [IU] | Freq: Once | INTRAVENOUS | Status: AC
Start: 2017-07-06 — End: 2017-07-06
  Administered 2017-07-06: 1000 [IU] via INTRAVENOUS
  Filled 2017-07-06: qty 1000

## 2017-07-06 MED ORDER — SODIUM CHLORIDE 0.9 % IV SOLN: INTRAVENOUS | Status: DC

## 2017-07-06 MED ORDER — METOPROLOL TARTRATE 5 MG/5ML IV SOLN
INTRAVENOUS | Status: AC
  Filled 2017-07-06: qty 5

## 2017-07-06 MED ORDER — SODIUM CHLORIDE 0.9 % IV BOLUS (SEPSIS)
250.0000 mL | Freq: Once | INTRAVENOUS | Status: AC
  Administered 2017-07-06: 250 mL via INTRAVENOUS

## 2017-07-06 MED ORDER — METOPROLOL TARTRATE 5 MG/5ML IV SOLN
2.5000 mg | Freq: Once | INTRAVENOUS | Status: AC
  Administered 2017-07-06: 2.5 mg via INTRAVENOUS

## 2017-07-06 MED ORDER — HEPARIN (PORCINE) IN NACL 100-0.45 UNIT/ML-% IJ SOLN
900.0000 [IU]/h | INTRAMUSCULAR | Status: DC
  Administered 2017-07-06: 650 [IU]/h via INTRAVENOUS
  Filled 2017-07-06 (×2): qty 250

## 2017-07-06 MED ORDER — CIPROFLOXACIN HCL 500 MG PO TABS
500.0000 mg | ORAL_TABLET | Freq: Two times a day (BID) | ORAL | Status: DC
  Administered 2017-07-06 – 2017-07-08 (×5): 500 mg via ORAL
  Filled 2017-07-06 (×5): qty 1

## 2017-07-06 MED ORDER — DILTIAZEM HCL 25 MG/5ML IV SOLN
10.0000 mg | Freq: Once | INTRAVENOUS | Status: DC
  Filled 2017-07-06: qty 5

## 2017-07-06 MED ORDER — DILTIAZEM HCL 25 MG/5ML IV SOLN
5.0000 mg | Freq: Once | INTRAVENOUS | Status: AC
  Administered 2017-07-06: 5 mg via INTRAVENOUS
  Filled 2017-07-06: qty 5

## 2017-07-06 MED ORDER — HEPARIN BOLUS VIA INFUSION
1500.0000 [IU] | Freq: Once | INTRAVENOUS | Status: AC
  Administered 2017-07-06: 1500 [IU] via INTRAVENOUS
  Filled 2017-07-06: qty 1500

## 2017-07-06 NOTE — Progress Notes (Signed)
PROGRESS NOTE  Renee Fritz ZOX:[id number] DOB: Oct 08, 1925 DOA: 07/03/2017 PCP: Lester [place]., MD  HPI/Recap of past 24 hours: Pt admits to multiple episodes of watery stools. C-diff negative. A-fib with RVR overnight. Back in sinus. Will get an 12 lead EKG. On heparin drip. The writer spoke with the pt's daughter and they will make decision about po anticoagulation. Patient is high fall risk. Denies chest pain or dyspnea.  Assessment/Plan: Active Problems:   Incarcerated hernia   Incarcerated femoral hernia   AKI (acute kidney injury) (HCC)   Diarrhea   Hypokalemia   Intractable vomiting with nausea   S/P hernia repair   Code Status:Full  Family Communication:No family members at bedside today.  Disposition Plan:Will keep another midnight to continue treatment.   Consultants:  General surgery  Procedures:  Left inguinal hernia repair  Antimicrobials:  Po flagyl for c-diff; pt allergic to vancomycin   DVT prophylaxis: Heparin drip due to paroxysmal a-fib rvr  Objective: Vitals:   07/06/17 0102 07/06/17 0300 07/06/17 0355 07/06/17 0733  BP: (!) 81/57 (!) 72/54 119/72 131/85  Pulse: (!) 46 (!) 36 (!) 105 74  Resp: (!) 24 18 20 20   Temp:   97.6 F (36.4 C) 97.6 F (36.4 C)  TempSrc:   Oral Oral  SpO2: 93% (!) 88% 93% 95%  Weight:      Height:        Intake/Output Summary (Last 24 hours) at 07/06/17 0734 Last data filed at 07/06/17 0708  Gross per 24 hour  Intake           1733.3 ml  Output             1750 ml  Net            -16.7 ml   Filed Weights   07/03/17 1538 07/03/17 2100  Weight: 40.8 kg (90 lb) 43.8 kg (96 lb 9 oz)    Exam:   General:83 year old caucasian female, thin built, sitting up in a chair. Hard of hearing.  Eyes:Pupils are round and reactive to light  WJX:BJYNWG membranes are moist, no erythema or drainage. NG tube in place, clamped.  Neck:No JVD, or thyromegaly  Cardiovascular:RRR with no murmurs,  rubs or gallops  Respiratory:Mild crackles at bases. No wheezes.  Abdomen:+ bowel sounds. Left scaring from surgical incisionlocated at left lower quadrant.  Skin:Scar noted at left lower quadrant of abdomen  Musculoskeletal:No noted muscular atrophy  Psychiatric:Mood is appropriate for condition and setting.  Neurologic:Alert and oriented x 3. No noted focal motor or sensory deficits  Data Reviewed: CBC:  Recent Labs Lab 07/03/17 0936 07/04/17 0237 07/04/17 0756 07/05/17 0522 07/06/17 0436  WBC 7.9 7.8  --  9.1 11.3*  NEUTROABS 6.3  --   --  6.7  --   HGB 14.3 10.6* 10.7* 10.1* 11.5*  HCT 40.6 31.1* 30.4* 30.8* 34.8*  MCV 87.9 88.9  --  91.1 90.9  PLT 292 205  --  235 244   Basic Metabolic Panel:  Recent Labs Lab 07/03/17 0936 07/04/17 0237 07/05/17 0522 07/06/17 0436  NA 129* 133* 139 133*  K 2.9* 3.4* 4.2 3.7  CL 75* 93* 105 100*  CO2 36* 29 27 25   GLUCOSE 127* 219* 131* 114*  BUN 25* 18 9 8   CREATININE 1.99* 0.99 0.60 0.65  CALCIUM 11.0* 8.3* 8.2* 7.5*   GFR: Estimated Creatinine Clearance: 33 mL/min (by C-G formula based on SCr of 0.65 mg/dL). Liver Function Tests:  Recent  Labs Lab 07/03/17 0936 07/04/17 0237  AST 40 26  ALT 21 15  ALKPHOS 59 33*  BILITOT 1.3* 0.5  PROT 8.4* 6.0*  ALBUMIN 4.3 3.1*   No results for input(s): LIPASE, AMYLASE in the last 168 hours. No results for input(s): AMMONIA in the last 168 hours. Coagulation Profile: No results for input(s): INR, PROTIME in the last 168 hours. Cardiac Enzymes: No results for input(s): CKTOTAL, CKMB, CKMBINDEX, TROPONINI in the last 168 hours. BNP (last 3 results) No results for input(s): PROBNP in the last 8760 hours. HbA1C:  Recent Labs  07/04/17 0756  HGBA1C 5.8*   CBG:  Recent Labs Lab 07/05/17 1224 07/05/17 1715 07/05/17 1928 07/05/17 2351 07/06/17 0334  GLUCAP 77 89 73 87 110*   Lipid Profile: No results for input(s): CHOL, HDL, LDLCALC, TRIG, CHOLHDL,  LDLDIRECT in the last 72 hours. Thyroid Function Tests: No results for input(s): TSH, T4TOTAL, FREET4, T3FREE, THYROIDAB in the last 72 hours. Anemia Panel: No results for input(s): VITAMINB12, FOLATE, FERRITIN, TIBC, IRON, RETICCTPCT in the last 72 hours. Urine analysis:    Component Value Date/Time   COLORURINE AMBER (A) 07/03/2017 0954   APPEARANCEUR CLOUDY (A) 07/03/2017 0954   LABSPEC 1.017 07/03/2017 0954   PHURINE 5.0 07/03/2017 0954   GLUCOSEU NEGATIVE 07/03/2017 0954   HGBUR NEGATIVE 07/03/2017 0954   BILIRUBINUR NEGATIVE 07/03/2017 0954   KETONESUR 5 (A) 07/03/2017 0954   PROTEINUR 100 (A) 07/03/2017 0954   NITRITE NEGATIVE 07/03/2017 0954   LEUKOCYTESUR SMALL (A) 07/03/2017 0954   Sepsis Labs: @LABRCNTIP (procalcitonin:4,lacticidven:4)  ) Recent Results (from the past 240 hour(s))  Urine culture     Status: Abnormal   Collection Time: 07/03/17 11:14 AM  Result Value Ref Range Status   Specimen Description URINE, RANDOM  Final   Special Requests NONE  Final   Culture >=100,000 COLONIES/mL CITROBACTER FREUNDII (A)  Final   Report Status 07/05/2017 FINAL  Final   Organism ID, Bacteria CITROBACTER FREUNDII (A)  Final      Susceptibility   Citrobacter freundii - MIC*    CEFAZOLIN >=64 RESISTANT Resistant     CEFTRIAXONE <=1 SENSITIVE Sensitive     CIPROFLOXACIN <=0.25 SENSITIVE Sensitive     GENTAMICIN <=1 SENSITIVE Sensitive     IMIPENEM <=0.25 SENSITIVE Sensitive     NITROFURANTOIN <=16 SENSITIVE Sensitive     TRIMETH/SULFA <=20 SENSITIVE Sensitive     PIP/TAZO <=4 SENSITIVE Sensitive     * >=100,000 COLONIES/mL CITROBACTER FREUNDII  C difficile quick scan w PCR reflex     Status: None   Collection Time: 07/03/17  1:01 PM  Result Value Ref Range Status   C Diff antigen NEGATIVE NEGATIVE Final   C Diff toxin NEGATIVE NEGATIVE Final   C Diff interpretation No C. difficile detected.  Final  MRSA PCR Screening     Status: Abnormal   Collection Time: 07/03/17   9:01 PM  Result Value Ref Range Status   MRSA by PCR POSITIVE (A) NEGATIVE Final    Comment:        The GeneXpert MRSA Assay (FDA approved for NASAL specimens only), is one component of a comprehensive MRSA colonization surveillance program. It is not intended to diagnose MRSA infection nor to guide or monitor treatment for MRSA infections. RESULT CALLED TO, READ BACK BY AND VERIFIED WITH: T.NELSON RN 07/03/17 2334 L.CHAMPION       Studies: No results found.  Scheduled Meds: . amLODipine  5 mg Oral Daily  . atenolol  100 mg Oral BID  . ciprofloxacin  500 mg Oral BID  . insulin aspart  0-9 Units Subcutaneous Q4H  . Mesalamine  800 mg Oral TID  . metroNIDAZOLE  500 mg Oral TID    Continuous Infusions: . sodium chloride 75 mL/hr at 07/06/17 0419  . acetaminophen Stopped (07/05/17 2312)  . heparin 650 Units/hr (07/06/17 0318)  . lactated ringers Stopped (07/03/17 2100)     LOS: 3 days   Assessment/Plan  Left femoral hernia with incarcerated small bowel s/p repair POD#2 -General surgery, Dr Magnus IvanBlackman following; we appreciate recommendations -Pain management IV tylenol 1000 mg BID for 1 day. Avoid narcotics. -NG tube; when NG tube removed, clear liquid then advance diet -Continue with close monitoring of symptoms and NG tube collection  Paroxysmal A-fib with RVR - back in sinus - obtain 12 leads EKG - CHADSVASC score 4 - heparin drip - HASBLED score of 2 moderate risk of major bleeding - High risk for fall. Family and pt will give final decision on oral anticoag   AKI on CKD stage 4 -resolved; -gentle IV fluid hydration with NS at 75 cc/hr  Elevated anion gap in the setting of elevated lactic acid and worsening renal function -resolved -continue IV fluid hydration   Recent c-diff colitis with self reported recurrence x 2 -treated in the outpatient stating on po flagyl -allergic to vancomycin - continue po flagyl - c-diff pcr negative  Elevated  lactic acid - improving  Hyponatremia/hypokalemia/hypomagnesemia -Most likely 2/2 to diarrhea -resolved -Repeat BMP in the am  Ulcerative colitis -continue mesalamine  HLD -conitnue simvastatin  HTN -continue amlodipine and atenolol  Deconditioning -OOB to chair -PT consulted; we appreciate recommendations -Fall precaution   Darlin Droparole N Evoleth Nordmeyer, MD [facility] Pager [phone]  If 7PM-7AM, please contact night-coverage [email or web address] Password [email or web address] 07/06/2017, 7:34 AM

## 2017-07-06 NOTE — Progress Notes (Signed)
Nurse contacted stating pt in Afib with RVR Some hypoTN Feels like heart racing but no other c/o nml BMET this am No reported h/o afib Given hypoTN, ordered low dose lopressor x1 Cardiology paged and consulted. Discussed with oncall fellow who will see/manage  Mary SellaEric M. Andrey CampanileWilson, MD, FACS General, Bariatric, & Minimally Invasive Surgery [facility], [place]

## 2017-07-06 NOTE — Progress Notes (Signed)
ANTICOAGULATION CONSULT NOTE - Initial Consult  Pharmacy Consult for heparin  Indication: atrial fibrillation  Allergies  Allergen Reactions  . Phenergan [Promethazine Hcl] Other (See Comments)    Feels like bugs crawling all over  . Vancomycin Diarrhea and Other (See Comments)    Stomach pains  . Erythromycin-Sulfisoxazole Rash  . Sulfa Antibiotics Rash   Patient Measurements: Height: 5' (152.4 cm) Weight: 96 lb 9 oz (43.8 kg) IBW/kg (Calculated) : 45.5 Heparin Dosing Weight: 43.8 kg   Vital Signs: Temp: 97.9 F (36.6 C) (11/02 2344) Temp Source: Oral (11/02 2344) BP: 81/57 (11/03 0102) Pulse Rate: 46 (11/03 0102)  Labs:  Recent Labs  07/03/17 0936 07/04/17 0237 07/04/17 0756 07/05/17 0522  HGB 14.3 10.6* 10.7* 10.1*  HCT 40.6 31.1* 30.4* 30.8*  PLT 292 205  --  235  CREATININE 1.99* 0.99  --  0.60    Estimated Creatinine Clearance: 33 mL/min (by C-G formula based on SCr of 0.6 mg/dL).   Medical History: Past Medical History:  Diagnosis Date  . Hypertension   . Osteoporosis   . Other specified cardiac arrhythmias   . Pneumonia   . Ulcerative (chronic) enterocolitis [facility](HCC)    Assessment: 83 yo female s/p hernia repair on 10/31. Now with new afib and pharmacy consulted to dose heparin. No oral AC listed per home med list.   Hgb 10.1 and plts normal at 235. No overt s/s bleeding documented.   Patient has been receiving Lovenox 30mg  q24hr, with last dose at 0915 on 11/2.   Goal of Therapy:  Heparin level 0.3-0.7 units/ml Monitor platelets by anticoagulation protocol: Yes   Plan:  D/C Lovenox  Heparin bolus 1000 units IV x1 (partial bolus in setting of recent lovenox) Start heparin gtt at 650 units/hr Heparin level in 8 hrs Daily heparin level and CBC Monitor for s/s bleeding   Einar CrowKatherine Cheney Gosch, PharmD Clinical Pharmacist 07/06/17 2:15 AM

## 2017-07-06 NOTE — Progress Notes (Signed)
0100: RN received call from central tele saying the patient's heart rate is elevated in the 150-160's. Performed EKG. Found patient was in Afib w/ RVR. Andrey CampanileWilson, MD notified. 2.5 lopressor IV push administered.   0130: Andrey CampanileWilson, MD consulted cardiology. Ave FilterVerde, MD came to bedside. Orders to give 250cc bolus, 5mg  cardizem IV push, and start heparin gtt.   0300: Patient BP 72/54 (61). Ave FilterVerde, MD with cardiology notified. Another 250cc bolus administered.  0400: Converted to NSR

## 2017-07-06 NOTE — Progress Notes (Signed)
ANTICOAGULATION CONSULT NOTE  Pharmacy Consult for heparin  Indication: atrial fibrillation  Allergies  Allergen Reactions  . Phenergan [Promethazine Hcl] Other (See Comments)    Feels like bugs crawling all over  . Vancomycin Diarrhea and Other (See Comments)    Stomach pains  . Erythromycin-Sulfisoxazole Rash  . Sulfa Antibiotics Rash   Patient Measurements: Height: 5' (152.4 cm) Weight: 96 lb 9 oz (43.8 kg) IBW/kg (Calculated) : 45.5 Heparin Dosing Weight: 43.8 kg   Vital Signs: Temp: 97.6 F (36.4 C) (11/03 1608) Temp Source: Oral (11/03 1608) BP: 135/68 (11/03 1608) Pulse Rate: 75 (11/03 1608)  Labs:  Recent Labs  07/04/17 0237 07/04/17 0756 07/05/17 0522 07/06/17 0436 07/06/17 1611  HGB 10.6* 10.7* 10.1* 11.5*  --   HCT 31.1* 30.4* 30.8* 34.8*  --   PLT 205  --  235 244  --   HEPARINUNFRC  --   --   --   --  <0.10*  CREATININE 0.99  --  0.60 0.65  --     Estimated Creatinine Clearance: 33 mL/min (by C-G formula based on SCr of 0.65 mg/dL).   Assessment: [age] yo female s/p hernia repair on 10/31. Now with new afib and pharmacy consulted to dose heparin. No oral AC listed per home med list.   Initial heparin rate started at 650 units/hr after 1000 unit bolus early this morning. First heparin level was exceptionally delayed at being drawn/resulting- just came back as undetectable.   Goal of Therapy:  Heparin level 0.3-0.7 units/ml Monitor platelets by anticoagulation protocol: Yes   Plan:  Re-bolus with 1500 units heparin IV x1, then increase infusion to 750 units/hr (~2.5units/kg/hr increase) Daily heparin level and CBC Monitor for s/s bleeding   Selin Eisler D. Cheikh Bramble, PharmD, BCPS Clinical Pharmacist  [phone] 07/06/2017 6:19 PM

## 2017-07-06 NOTE — Progress Notes (Signed)
  Amiodarone Drug - Drug Interaction Consult Note  Recommendations: -Monitor QTc when given in combination with other QT prolonging agents, such as ondansetron  -Monitor HR for bradycardia/AV block when given in combination with BB or CCB -Monitor electrolytes when given with diuretics (pt on HCTZ prior to admission) -Monitor for s/s of myopathy when given in combination with statins (pt on simvastatin 20mg  prior to admission).   Amiodarone is metabolized by the cytochrome P450 system and therefore has the potential to cause many drug interactions. Amiodarone has an average plasma half-life of 50 days (range 20 to 100 days).   There is potential for drug interactions to occur several weeks or months after stopping treatment and the onset of drug interactions may be slow after initiating amiodarone.   [x]  Statins: Increased risk of myopathy. Simvastatin- restrict dose to 20mg  daily. Other statins: counsel patients to report any muscle pain or weakness immediately.  []  Anticoagulants: Amiodarone can increase anticoagulant effect. Consider warfarin dose reduction. Patients should be monitored closely and the dose of anticoagulant altered accordingly, remembering that amiodarone levels take several weeks to stabilize.  []  Antiepileptics: Amiodarone can increase plasma concentration of phenytoin, the dose should be reduced. Note that small changes in phenytoin dose can result in large changes in levels. Monitor patient and counsel on signs of toxicity.  [x]  Beta blockers: increased risk of bradycardia, AV block and myocardial depression. Sotalol - avoid concomitant use.  [x]   Calcium channel blockers (diltiazem and verapamil): increased risk of bradycardia, AV block and myocardial depression.  []   Cyclosporine: Amiodarone increases levels of cyclosporine. Reduced dose of cyclosporine is recommended.  []  Digoxin dose should be halved when amiodarone is started.  [x]  Diuretics: increased risk of  cardiotoxicity if hypokalemia occurs.  []  Oral hypoglycemic agents (glyburide, glipizide, glimepiride): increased risk of hypoglycemia. Patient's glucose levels should be monitored closely when initiating amiodarone therapy.   [x]  Drugs that prolong the QT interval:  Torsades de pointes risk may be increased with concurrent use - avoid if possible.  Monitor QTc, also keep magnesium/potassium WNL if concurrent therapy can't be avoided. Marland Kitchen. Antibiotics: e.g. fluoroquinolones, erythromycin. . Antiarrhythmics: e.g. quinidine, procainamide, disopyramide, sotalol. . Antipsychotics: e.g. phenothiazines, haloperidol.  . Lithium, tricyclic antidepressants, and methadone.  Einar CrowKatherine Kazumi Lachney, PharmD Clinical Pharmacist 07/06/17 2:43 AM

## 2017-07-06 NOTE — Consult Note (Signed)
Reason for Consult:  ATRIAL FIBRILLATION Referring Physician:   DR  Redmond Pulling , (SURGERY)  Renee Fritz is an 83 y.o. female.  HPI:   Thx  For  The  Consult PT  Was  Noted to have  Paroxysmal Afib  RVR   With HR in 150's   And  Was given  Lopressor  2.5  Mg  IV  And  Cards  Consulted/ We  Evaluated  Patient  And  Has no  H/o  PCI or  Has  Questionable  Possible  Prior  H/O  AFIB. C./o  Palpitations  At  Bed rest, , with  slioght  C/o  Chest pain , , no  Dyspnea Patient  Awake  , alert , but  Tired.   Indications: This is an 83 year old female with multiple medical problems who was recently being treated for possible C. difficile colitis.  She presented with nausea vomiting and abdominal pain over 3 days.  She had a CT scan today which showed an incarcerated left femoral hernia containing small bowel with a small bowel obstruction.  The decision was made to proceed emergently to the operating room  Findings: The patient was found to have an incarcerated left femoral hernia containing small intestines.  There were mild ischemic changes and bruising of the small bowel but no gross infarction so I elected not to resect the bowel.  The femoral hernia was repaired primarily   PAF S/P  Incarcerated  Hernia IV  Fluid  Bolus Hep gtt amio  Gtt cardizem once  5 mg IV  Bolus  If  Sbp<90, stop  To amio  Gtt Echo am   Past Medical History:  Diagnosis Date  . Hypertension   . Osteoporosis   . Other specified cardiac arrhythmias   . Pneumonia   . Ulcerative (chronic) enterocolitis ([place])     Past Surgical History:  Procedure Laterality Date  . FEMORAL HERNIA REPAIR Left 07/03/2017   Procedure: HERNIA REPAIR FEMORAL;  Surgeon: Coralie Keens, MD;  Location: [facility];  Service: General;  Laterality: Left;  . urinary ostomy      Family History  Problem Relation Age of Onset  . Family history unknown: Yes    Social History:  reports that she has never smoked. She has never used smokeless  tobacco. She reports that she does not drink alcohol or use drugs.  Allergies:  Allergies  Allergen Reactions  . Phenergan [Promethazine Hcl] Other (See Comments)    Feels like bugs crawling all over  . Vancomycin Diarrhea and Other (See Comments)    Stomach pains  . Erythromycin-Sulfisoxazole Rash  . Sulfa Antibiotics Rash    Medications: I have reviewed the patient's current medications.  Results for orders placed or performed during the hospital encounter of 07/03/17 (from the past 48 hour(s))  CBC     Status: Abnormal   Collection Time: 07/04/17  2:37 AM  Result Value Ref Range   WBC 7.8 4.0 - 10.5 K/uL   RBC 3.50 (L) 3.87 - 5.11 MIL/uL   Hemoglobin 10.6 (L) 12.0 - 15.0 g/dL    Comment: REPEATED TO VERIFY SPECIMEN CHECKED FOR CLOTS DELTA CHECK NOTED    HCT 31.1 (L) 36.0 - 46.0 %   MCV 88.9 78.0 - 100.0 fL   MCH 30.3 26.0 - 34.0 pg   MCHC 34.1 30.0 - 36.0 g/dL   RDW 14.5 11.5 - 15.5 %   Platelets 205 150 - 400 K/uL  Comprehensive metabolic panel  Status: Abnormal   Collection Time: 07/04/17  2:37 AM  Result Value Ref Range   Sodium 133 (L) 135 - 145 mmol/L   Potassium 3.4 (L) 3.5 - 5.1 mmol/L   Chloride 93 (L) 101 - 111 mmol/L   CO2 29 22 - 32 mmol/L   Glucose, Bld 219 (H) 65 - 99 mg/dL   BUN 18 6 - 20 mg/dL   Creatinine, Ser 0.99 0.44 - 1.00 mg/dL   Calcium 8.3 (L) 8.9 - 10.3 mg/dL    Comment: DELTA CHECK NOTED   Total Protein 6.0 (L) 6.5 - 8.1 g/dL   Albumin 3.1 (L) 3.5 - 5.0 g/dL   AST 26 15 - 41 U/L   ALT 15 14 - 54 U/L   Alkaline Phosphatase 33 (L) 38 - 126 U/L   Total Bilirubin 0.5 0.3 - 1.2 mg/dL   GFR calc non Af Amer 49 (L) >60 mL/min   GFR calc Af Amer 57 (L) >60 mL/min    Comment: (NOTE) The eGFR has been calculated using the CKD EPI equation. This calculation has not been validated in all clinical situations. eGFR's persistently <60 mL/min signify possible Chronic Kidney Disease.    Anion gap 11 5 - 15  Hemoglobin A1c     Status: Abnormal    Collection Time: 07/04/17  7:56 AM  Result Value Ref Range   Hgb A1c MFr Bld 5.8 (H) 4.8 - 5.6 %    Comment: (NOTE) Pre diabetes:          5.7%-6.4% Diabetes:              >6.4% Glycemic control for   <7.0% adults with diabetes    Mean Plasma Glucose 119.76 mg/dL  Hemoglobin and hematocrit, blood     Status: Abnormal   Collection Time: 07/04/17  7:56 AM  Result Value Ref Range   Hemoglobin 10.7 (L) 12.0 - 15.0 g/dL   HCT 30.4 (L) 36.0 - 46.0 %  Glucose, capillary     Status: Abnormal   Collection Time: 07/04/17  9:15 AM  Result Value Ref Range   Glucose-Capillary 139 (H) 65 - 99 mg/dL  Glucose, capillary     Status: Abnormal   Collection Time: 07/04/17 12:23 PM  Result Value Ref Range   Glucose-Capillary 133 (H) 65 - 99 mg/dL  Glucose, capillary     Status: None   Collection Time: 07/04/17  4:43 PM  Result Value Ref Range   Glucose-Capillary 99 65 - 99 mg/dL  Glucose, capillary     Status: Abnormal   Collection Time: 07/04/17  7:23 PM  Result Value Ref Range   Glucose-Capillary 117 (H) 65 - 99 mg/dL  Glucose, capillary     Status: Abnormal   Collection Time: 07/05/17 12:06 AM  Result Value Ref Range   Glucose-Capillary 115 (H) 65 - 99 mg/dL  Glucose, capillary     Status: Abnormal   Collection Time: 07/05/17  4:12 AM  Result Value Ref Range   Glucose-Capillary 119 (H) 65 - 99 mg/dL  Basic metabolic panel     Status: Abnormal   Collection Time: 07/05/17  5:22 AM  Result Value Ref Range   Sodium 139 135 - 145 mmol/L   Potassium 4.2 3.5 - 5.1 mmol/L    Comment: DELTA CHECK NOTED   Chloride 105 101 - 111 mmol/L   CO2 27 22 - 32 mmol/L   Glucose, Bld 131 (H) 65 - 99 mg/dL   BUN 9 6 - 20 mg/dL  Creatinine, Ser 0.60 0.44 - 1.00 mg/dL   Calcium 8.2 (L) 8.9 - 10.3 mg/dL   GFR calc non Af Amer >60 >60 mL/min   GFR calc Af Amer >60 >60 mL/min    Comment: (NOTE) The eGFR has been calculated using the CKD EPI equation. This calculation has not been validated in all  clinical situations. eGFR's persistently <60 mL/min signify possible Chronic Kidney Disease.    Anion gap 7 5 - 15  CBC with Differential/Platelet     Status: Abnormal   Collection Time: 07/05/17  5:22 AM  Result Value Ref Range   WBC 9.1 4.0 - 10.5 K/uL   RBC 3.38 (L) 3.87 - 5.11 MIL/uL   Hemoglobin 10.1 (L) 12.0 - 15.0 g/dL   HCT 30.8 (L) 36.0 - 46.0 %   MCV 91.1 78.0 - 100.0 fL   MCH 29.9 26.0 - 34.0 pg   MCHC 32.8 30.0 - 36.0 g/dL   RDW 15.5 11.5 - 15.5 %   Platelets 235 150 - 400 K/uL   Neutrophils Relative % 73 %   Neutro Abs 6.7 1.7 - 7.7 K/uL   Lymphocytes Relative 18 %   Lymphs Abs 1.6 0.7 - 4.0 K/uL   Monocytes Relative 9 %   Monocytes Absolute 0.8 0.1 - 1.0 K/uL   Eosinophils Relative 0 %   Eosinophils Absolute 0.0 0.0 - 0.7 K/uL   Basophils Relative 0 %   Basophils Absolute 0.0 0.0 - 0.1 K/uL  Glucose, capillary     Status: None   Collection Time: 07/05/17  7:47 AM  Result Value Ref Range   Glucose-Capillary 90 65 - 99 mg/dL   Comment 1 Notify RN    Comment 2 Document in Chart   Glucose, capillary     Status: Abnormal   Collection Time: 07/05/17 12:13 PM  Result Value Ref Range   Glucose-Capillary 58 (L) 65 - 99 mg/dL  Glucose, capillary     Status: None   Collection Time: 07/05/17 12:24 PM  Result Value Ref Range   Glucose-Capillary 77 65 - 99 mg/dL  Glucose, capillary     Status: None   Collection Time: 07/05/17  5:15 PM  Result Value Ref Range   Glucose-Capillary 89 65 - 99 mg/dL  Glucose, capillary     Status: None   Collection Time: 07/05/17  7:28 PM  Result Value Ref Range   Glucose-Capillary 73 65 - 99 mg/dL  Glucose, capillary     Status: None   Collection Time: 07/05/17 11:51 PM  Result Value Ref Range   Glucose-Capillary 87 65 - 99 mg/dL    No results found.  ROS: all systems  Reviewed  , positive  For  PALPITATIOS,  SLIGHT  CHEST PRESSURE, WEAKNESS,   Blood pressure (!) 81/57, pulse (!) 46, temperature 97.9 F (36.6 C), temperature  source Oral, resp. rate (!) 24, height 5' (1.524 m), weight 43.8 kg (96 lb 9 oz), SpO2 93 %. Physical Exam  Constitutional: She appears distressed.  HENT:  Head: Normocephalic.  Eyes: Pupils are equal, round, and reactive to light.  Neck: Normal range of motion.  Cardiovascular: Exam reveals gallop.   Murmur heard. Respiratory: She is in respiratory distress.  Neurological: She is alert.  Skin: Skin is warm.   Physical Exam  Constitutional: She appears distressed.  HENT:  Head: Normocephalic.  Eyes: Pupils are equal, round, and reactive to light.  Neck: Normal range of motion.  Cardiovascular: Exam reveals gallop.   Murmur heard. Pulmonary/Chest:  She is in respiratory distress.  Neurological: She is alert.  Skin: Skin is warm.   Assessment/Plan:   PAROXYSMAL AFIB  2 vasc  Score >2- needs  Long  Term  Anticoagulation , PAROXYSMAL AFIB -  PT  CONVERTED  WITH  CARDIZEM ,  ECHO  ORDERED, IV  BOLUS 250 ML  NS  GIVEN   CARDS  TEAM  EVAL  EP  WILL EVAL   S/P  INCARCERATED  FEMORAL HERNIA PER  SURGERY   Deondrae Mcgrail 07/06/2017, 1:50 AM     EXAM

## 2017-07-06 NOTE — Progress Notes (Signed)
3 Days Post-Op   Subjective/Chief Complaint: Having diarrhea, afib overnight converted on heparin now per cardiology, took some broth yesterday   Objective: Vital signs in last 24 hours: Temp:  [97.6 F (36.4 C)-98.1 F (36.7 C)] 97.6 F (36.4 C) (11/03 0733) Pulse Rate:  [36-156] 74 (11/03 0733) Resp:  [18-24] 20 (11/03 0733) BP: (72-131)/(54-85) 131/85 (11/03 0733) SpO2:  [88 %-95 %] 95 % (11/03 0733) Last BM Date: 07/05/17  Intake/Output from previous day: 11/02 0701 - 11/03 0700 In: 1733.3 [P.O.:120; I.V.:1263.3; IV Piggyback:350] Out: 1250 [Urine:1250] Intake/Output this shift: Total I/O In: -  Out: 500 [Urine:500]  Resp: clear to auscultation bilaterally Cardio: regular rate and rhythm GI: left groin incision clean without infection, soft, nt bs present  Lab Results:   Recent Labs  07/05/17 0522 07/06/17 0436  WBC 9.1 11.3*  HGB 10.1* 11.5*  HCT 30.8* 34.8*  PLT 235 244   BMET  Recent Labs  07/05/17 0522 07/06/17 0436  NA 139 133*  K 4.2 3.7  CL 105 100*  CO2 27 25  GLUCOSE 131* 114*  BUN 9 8  CREATININE 0.60 0.65  CALCIUM 8.2* 7.5*   PT/INR No results for input(s): LABPROT, INR in the last 72 hours. ABG No results for input(s): PHART, HCO3 in the last 72 hours.  Invalid input(s): PCO2, PO2  Studies/Results: No results found.  Anti-infectives: Anti-infectives    Start     Dose/Rate Route Frequency Ordered Stop   07/06/17 0800  ciprofloxacin (CIPRO) tablet 500 mg     500 mg Oral 2 times daily 07/06/17 0734 07/13/17 0759   07/04/17 0600  cefoTEtan in Dextrose 5% (CEFOTAN) IVPB 2 g  Status:  Discontinued     2 g Intravenous On call to O.R. 07/03/17 1438 07/03/17 1525   07/04/17 0600  cefoTEtan in Dextrose 5% (CEFOTAN) IVPB 2 g    Comments:  She already has this ordered.  Do not give twice.   2 g Intravenous On call to O.R. 07/03/17 1525 07/03/17 1610   07/03/17 2200  metroNIDAZOLE (FLAGYL) tablet 500 mg     500 mg Oral 3 times daily  07/03/17 2103        Assessment/Plan: Incarcerated left femoral hernia POD#2 S/P HERNIA REPAIR FEMORAL 07/03/17 Dr. Abigail Miyamotoouglas Blackman - will give her fulls today - decrease narcotic meds to minimize confusion - PT/OT eval  CV: afib converted, cards following,  FEN: fulls today UJ:[id number]:perioperative cefotetan 10/31; urine cx 10/31 growing citrobacter freundii on cipro VTE: SCD's, Lovenox  Follow up:Dr. Abigail Miyamotoouglas Blackman  Foley:self cath  dispo remain stepdown 24 hours due to needs for care with diarrhea and afib overnight now on heparin  LOS: 2 days    [facility] NorthWAKEFIELD,Lendy Dittrich 07/06/2017

## 2017-07-06 NOTE — Progress Notes (Signed)
Report called and given to [facility], RN

## 2017-07-06 NOTE — Significant Event (Signed)
Rapid Response Event Note RN called for new onset A-fib Overview: Time Called: 0048 Arrival Time: 0050 Event Type: Cardiac, Hypotension  Initial Focused Assessment: On arrival pt lying supine in bed, skin warm and dry, alert and oriented x3, denies any SOB or discomfort. Pt's HR 150-160's, EKG shows A-fib with RVR. Dr. Andrey CampanileWilson paged prior to arrival. New orders for Metoprolol 2.5 mg IVp given and [facility].  Dr. Andrey CampanileWilson consulted with Cardiology. Cardiology to bedside. New orders given and completed.  Interventions: 2.5 mg Metoprolol IVP, Heparin gtt, 5mg  Cardizem bolus, EKG.  Plan of Care (if not transferred): Continue to monitor pt, Misty RN aware, call for any concerns.  Event Summary: Name of Physician Notified: Dr. Andrey Campanilewilson  at  (paged PTA RRT )    at    Outcome: Stayed in room and stabalized     Briarcliffe AcresSHULAR, Arianna Delsanto Green ForestPaige

## 2017-07-07 ENCOUNTER — Other Ambulatory Visit: Payer: Self-pay | Admitting: Cardiology

## 2017-07-07 ENCOUNTER — Other Ambulatory Visit: Payer: Self-pay

## 2017-07-07 DIAGNOSIS — Z8719 Personal history of other diseases of the digestive system: Secondary | ICD-10-CM

## 2017-07-07 DIAGNOSIS — Z9889 Other specified postprocedural states: Secondary | ICD-10-CM

## 2017-07-07 DIAGNOSIS — I480 Paroxysmal atrial fibrillation: Secondary | ICD-10-CM

## 2017-07-07 DIAGNOSIS — N179 Acute kidney failure, unspecified: Secondary | ICD-10-CM

## 2017-07-07 DIAGNOSIS — E876 Hypokalemia: Secondary | ICD-10-CM

## 2017-07-07 LAB — CBC
HEMATOCRIT: 37 % (ref 36.0–46.0)
HEMOGLOBIN: 12.5 g/dL (ref 12.0–15.0)
MCH: 29.8 pg (ref 26.0–34.0)
MCHC: 33.8 g/dL (ref 30.0–36.0)
MCV: 88.3 fL (ref 78.0–100.0)
Platelets: 294 10*3/uL (ref 150–400)
RBC: 4.19 MIL/uL (ref 3.87–5.11)
RDW: 14.1 % (ref 11.5–15.5)
WBC: 10.5 10*3/uL (ref 4.0–10.5)

## 2017-07-07 LAB — BASIC METABOLIC PANEL
ANION GAP: 10 (ref 5–15)
BUN: <5 mg/dL — ABNORMAL LOW (ref 6–20)
CHLORIDE: 96 mmol/L — AB (ref 101–111)
CO2: 27 mmol/L (ref 22–32)
Calcium: 7.4 mg/dL — ABNORMAL LOW (ref 8.9–10.3)
Creatinine, Ser: 0.49 mg/dL (ref 0.44–1.00)
GFR calc Af Amer: >60 mL/min (ref >60)
GLUCOSE: 116 mg/dL — AB (ref 65–99)
POTASSIUM: 2.5 mmol/L — AB (ref 3.5–5.1)
Sodium: 133 mmol/L — ABNORMAL LOW (ref 135–145)

## 2017-07-07 LAB — MAGNESIUM: MAGNESIUM: 1 mg/dL — AB (ref 1.7–2.4)

## 2017-07-07 LAB — HEPARIN LEVEL (UNFRACTIONATED)
HEPARIN UNFRACTIONATED: 0.44 [IU]/mL (ref 0.30–0.70)
Heparin Unfractionated: 0.15 IU/mL — ABNORMAL LOW (ref 0.30–0.70)

## 2017-07-07 LAB — GLUCOSE, CAPILLARY
GLUCOSE-CAPILLARY: 83 mg/dL (ref 65–99)
GLUCOSE-CAPILLARY: 205 mg/dL — AB (ref 65–99)
GLUCOSE-CAPILLARY: 99 mg/dL (ref 65–99)
GLUCOSE-CAPILLARY: 104 mg/dL — AB (ref 65–99)
Glucose-Capillary: 95 mg/dL (ref 65–99)

## 2017-07-07 LAB — PHOSPHORUS: Phosphorus: 1.1 mg/dL — ABNORMAL LOW (ref 2.5–4.6)

## 2017-07-07 MED ORDER — METOPROLOL TARTRATE 5 MG/5ML IV SOLN: 5.0000 mg | Freq: Once | INTRAVENOUS | Status: DC

## 2017-07-07 MED ORDER — POTASSIUM CHLORIDE CRYS ER 20 MEQ PO TBCR
40.0000 meq | EXTENDED_RELEASE_TABLET | ORAL | Status: AC
  Administered 2017-07-07 (×2): 40 meq via ORAL
  Filled 2017-07-07 (×2): qty 2

## 2017-07-07 MED ORDER — ASPIRIN 81 MG PO CHEW
81.0000 mg | CHEWABLE_TABLET | Freq: Every day | ORAL | Status: DC
Start: 2017-07-08 — End: 2017-07-08
  Administered 2017-07-08: 81 mg via ORAL
  Filled 2017-07-07: qty 1

## 2017-07-07 MED ORDER — HEPARIN BOLUS VIA INFUSION
1000.0000 [IU] | Freq: Once | INTRAVENOUS | Status: AC
  Administered 2017-07-07: 1000 [IU] via INTRAVENOUS
  Filled 2017-07-07: qty 1000

## 2017-07-07 MED ORDER — POTASSIUM PHOSPHATES 15 MMOLE/5ML IV SOLN
40.0000 meq | Freq: Once | INTRAVENOUS | Status: AC
  Administered 2017-07-07: 40 meq via INTRAVENOUS
  Filled 2017-07-07 (×2): qty 9.09

## 2017-07-07 MED ORDER — ACETAMINOPHEN 325 MG PO TABS: 650.0000 mg | ORAL_TABLET | Freq: Four times a day (QID) | ORAL | Status: DC | PRN

## 2017-07-07 MED ORDER — MAGNESIUM SULFATE 4 GM/100ML IV SOLN
4.0000 g | Freq: Once | INTRAVENOUS | Status: AC
  Administered 2017-07-07: 4 g via INTRAVENOUS
  Filled 2017-07-07: qty 100

## 2017-07-07 NOTE — Progress Notes (Signed)
CRITICAL VALUE ALERT  Critical Value:  K 2.5  Date & Time Notied:  07/07/2017 0704  Provider Notified: MD. Delma Post. Hall  Orders Received/Actions taken: Awaiting orders.

## 2017-07-07 NOTE — Progress Notes (Signed)
DAILY PROGRESS NOTE   Patient Name: Renee Fritz Date of Encounter: 07/07/2017  Cardiologist: New (previously followed by cardiology at [facility])  Chief Complaint   Diarrhea has improved  Patient Profile   83 yo female with incarcerated hernia, complicated by a-fib with RVR. CHADSVASC score of 2. History of diarrhea (c. Diff) in the past, but negative here.  Subjective   Converted back to sinus yesterday. No further a-fib. Daughter says she had a-fib 20 years ago when she was ill, but none since. Total duration of a-fib <24 hours. On IV heparin.  Objective   Vitals:   07/06/17 1957 07/07/17 0409 07/07/17 0411 07/07/17 1204  BP: 133/71 127/64 127/67 120/74  Pulse: 89 (!) 40 72   Resp: '18 18 18   ' Temp: 98.5 F (36.9 C) 98.1 F (36.7 C) 98.1 F (36.7 C)   TempSrc: Oral Oral Oral   SpO2: 100%  97%   Weight:      Height:        Intake/Output Summary (Last 24 hours) at 07/07/2017 1219 Last data filed at 07/07/2017 0744 Gross per 24 hour  Intake 1053.33 ml  Output 1950 ml  Net -896.67 ml   Filed Weights   07/03/17 1538 07/03/17 2100  Weight: 90 lb (40.8 kg) 96 lb 9 oz (43.8 kg)    Physical Exam   General appearance: alert and no distress Lungs: clear to auscultation bilaterally Heart: regular rate and rhythm Extremities: extremities normal, atraumatic, no cyanosis or edema Neurologic: Grossly normal  Inpatient Medications    Scheduled Meds: . amLODipine  5 mg Oral Daily  . atenolol  100 mg Oral BID  . ciprofloxacin  500 mg Oral BID  . insulin aspart  0-9 Units Subcutaneous Q4H  . Mesalamine  800 mg Oral TID  . metoprolol tartrate  5 mg Intravenous Once  . metroNIDAZOLE  500 mg Oral TID    Continuous Infusions: . heparin 900 Units/hr (07/07/17 0738)  . magnesium sulfate 1 - 4 g bolus IVPB    . potassium PHOSPHATE IVPB (mEq)      PRN Meds: acetaminophen, hydrALAZINE, ondansetron **OR** ondansetron (ZOFRAN) IV   Labs   Results for orders placed  or performed during the hospital encounter of 07/03/17 (from the past 48 hour(s))  Glucose, capillary     Status: None   Collection Time: 07/05/17 12:24 PM  Result Value Ref Range   Glucose-Capillary 77 65 - 99 mg/dL  Glucose, capillary     Status: None   Collection Time: 07/05/17  5:15 PM  Result Value Ref Range   Glucose-Capillary 89 65 - 99 mg/dL  Glucose, capillary     Status: None   Collection Time: 07/05/17  7:28 PM  Result Value Ref Range   Glucose-Capillary 73 65 - 99 mg/dL  Glucose, capillary     Status: None   Collection Time: 07/05/17 11:51 PM  Result Value Ref Range   Glucose-Capillary 87 65 - 99 mg/dL  Glucose, capillary     Status: Abnormal   Collection Time: 07/06/17  3:34 AM  Result Value Ref Range   Glucose-Capillary 110 (H) 65 - 99 mg/dL  CBC     Status: Abnormal   Collection Time: 07/06/17  4:36 AM  Result Value Ref Range   WBC 11.3 (H) 4.0 - 10.5 K/uL   RBC 3.83 (L) 3.87 - 5.11 MIL/uL   Hemoglobin 11.5 (L) 12.0 - 15.0 g/dL   HCT 34.8 (L) 36.0 - 46.0 %  MCV 90.9 78.0 - 100.0 fL   MCH 30.0 26.0 - 34.0 pg   MCHC 33.0 30.0 - 36.0 g/dL   RDW 14.8 11.5 - 15.5 %   Platelets 244 150 - 400 K/uL  Basic metabolic panel     Status: Abnormal   Collection Time: 07/06/17  4:36 AM  Result Value Ref Range   Sodium 133 (L) 135 - 145 mmol/L   Potassium 3.7 3.5 - 5.1 mmol/L   Chloride 100 (L) 101 - 111 mmol/L   CO2 25 22 - 32 mmol/L   Glucose, Bld 114 (H) 65 - 99 mg/dL   BUN 8 6 - 20 mg/dL   Creatinine, Ser 0.65 0.44 - 1.00 mg/dL   Calcium 7.5 (L) 8.9 - 10.3 mg/dL   GFR calc non Af Amer >60 >60 mL/min   GFR calc Af Amer >60 >60 mL/min    Comment: (NOTE) The eGFR has been calculated using the CKD EPI equation. This calculation has not been validated in all clinical situations. eGFR's persistently <60 mL/min signify possible Chronic Kidney Disease.    Anion gap 8 5 - 15  Glucose, capillary     Status: None   Collection Time: 07/06/17  7:31 AM  Result Value Ref  Range   Glucose-Capillary 92 65 - 99 mg/dL  Glucose, capillary     Status: Abnormal   Collection Time: 07/06/17 12:47 PM  Result Value Ref Range   Glucose-Capillary 127 (H) 65 - 99 mg/dL  Heparin level (unfractionated)     Status: Abnormal   Collection Time: 07/06/17  4:11 PM  Result Value Ref Range   Heparin Unfractionated <0.10 (L) 0.30 - 0.70 IU/mL    Comment:        IF HEPARIN RESULTS ARE BELOW EXPECTED VALUES, AND PATIENT DOSAGE HAS BEEN CONFIRMED, SUGGEST FOLLOW UP TESTING OF ANTITHROMBIN III LEVELS.   Glucose, capillary     Status: Abnormal   Collection Time: 07/06/17  8:28 PM  Result Value Ref Range   Glucose-Capillary 123 (H) 65 - 99 mg/dL  Glucose, capillary     Status: None   Collection Time: 07/06/17 11:34 PM  Result Value Ref Range   Glucose-Capillary 86 65 - 99 mg/dL  Glucose, capillary     Status: None   Collection Time: 07/07/17  3:58 AM  Result Value Ref Range   Glucose-Capillary 95 65 - 99 mg/dL  Basic metabolic panel     Status: Abnormal   Collection Time: 07/07/17  5:37 AM  Result Value Ref Range   Sodium 133 (L) 135 - 145 mmol/L   Potassium 2.5 (LL) 3.5 - 5.1 mmol/L    Comment: CRITICAL RESULT CALLED TO, READ BACK BY AND VERIFIED WITH: K.ROMEFBERG RN @ 0701 07/07/17 BY C.EDENS    Chloride 96 (L) 101 - 111 mmol/L   CO2 27 22 - 32 mmol/L   Glucose, Bld 116 (H) 65 - 99 mg/dL   BUN <5 (L) 6 - 20 mg/dL   Creatinine, Ser 0.49 0.44 - 1.00 mg/dL   Calcium 7.4 (L) 8.9 - 10.3 mg/dL   GFR calc non Af Amer >60 >60 mL/min   GFR calc Af Amer >60 >60 mL/min    Comment: (NOTE) The eGFR has been calculated using the CKD EPI equation. This calculation has not been validated in all clinical situations. eGFR's persistently <60 mL/min signify possible Chronic Kidney Disease.    Anion gap 10 5 - 15  Heparin level (unfractionated)     Status: Abnormal  Collection Time: 07/07/17  5:37 AM  Result Value Ref Range   Heparin Unfractionated 0.15 (L) 0.30 - 0.70 IU/mL     Comment:        IF HEPARIN RESULTS ARE BELOW EXPECTED VALUES, AND PATIENT DOSAGE HAS BEEN CONFIRMED, SUGGEST FOLLOW UP TESTING OF ANTITHROMBIN III LEVELS.   CBC     Status: None   Collection Time: 07/07/17  5:37 AM  Result Value Ref Range   WBC 10.5 4.0 - 10.5 K/uL   RBC 4.19 3.87 - 5.11 MIL/uL   Hemoglobin 12.5 12.0 - 15.0 g/dL   HCT 37.0 36.0 - 46.0 %   MCV 88.3 78.0 - 100.0 fL   MCH 29.8 26.0 - 34.0 pg   MCHC 33.8 30.0 - 36.0 g/dL   RDW 14.1 11.5 - 15.5 %   Platelets 294 150 - 400 K/uL  Magnesium     Status: Abnormal   Collection Time: 07/07/17  5:37 AM  Result Value Ref Range   Magnesium 1.0 (L) 1.7 - 2.4 mg/dL  Phosphorus     Status: Abnormal   Collection Time: 07/07/17  5:37 AM  Result Value Ref Range   Phosphorus 1.1 (L) 2.5 - 4.6 mg/dL  Glucose, capillary     Status: None   Collection Time: 07/07/17  8:30 AM  Result Value Ref Range   Glucose-Capillary 83 65 - 99 mg/dL    ECG   N/A  Telemetry   Sinus rhythm - Personally Reviewed  Radiology    No results found.  Cardiac Studies   N/A  Assessment   1. Active Problems: 2.   Incarcerated hernia 3.   Incarcerated femoral hernia 4.   AKI (acute kidney injury) ([place]) 5.   Diarrhea 6.   Hypokalemia 7.   Intractable vomiting with nausea 8.   S/P hernia repair 9.   Plan   1. Brief a-fib associated with surgery - no clear indication for long-term anticoagulation. Will d/c IV heparin. Recommend outpatient 30 day monitor to determine if she is having more a-fib and whether she will need anticoagulation beyond low dose aspirin. She was previously seen by cardiology in [facility], however, her cardiologist retired. Will arrange for a monitor and follow-up with our cardiologists at [facility].  Time Spent Directly with Patient:  I have spent a total of 15 minutes with the patient reviewing hospital notes, telemetry, EKGs, labs and examining the patient as well as establishing an assessment and plan  that was discussed personally with the patient. > 50% of time was spent in direct patient care.  Length of Stay:  LOS: 4 days   Pixie Casino, MD, [place]  Attending Cardiologist  Direct Dial: [phone]  Fax: [phone]  Website:  [email or web address] 07/07/2017, 12:19 PM

## 2017-07-07 NOTE — Progress Notes (Signed)
4 Days Post-Op   Subjective/Chief Complaint: Still with diarrhea, urinary frequency. Tolerating fulls.    Objective: Vital signs in last 24 hours: Temp:  [97.6 F (36.4 C)-98.5 F (36.9 C)] 98.1 F (36.7 C) (11/04 0411) Pulse Rate:  [40-89] 72 (11/04 0411) Resp:  [18-20] 18 (11/04 0411) BP: (127-135)/(64-88) 127/67 (11/04 0411) SpO2:  [97 %-100 %] 97 % (11/04 0411) Last BM Date: 07/06/17  Intake/Output from previous day: 11/03 0701 - 11/04 0700 In: 1053.3 [I.V.:1053.3] Out: 2350 [Urine:2350] Intake/Output this shift: Total I/O In: -  Out: 100 [Urine:100]  Resp: clear to auscultation bilaterally Cardio: regular rate and rhythm GI: left groin incision clean without infection, soft, nt bs present  Lab Results:  Recent Labs    07/06/17 0436 07/07/17 0537  WBC 11.3* 10.5  HGB 11.5* 12.5  HCT 34.8* 37.0  PLT 244 294   BMET Recent Labs    07/06/17 0436 07/07/17 0537  NA 133* 133*  K 3.7 2.5*  CL 100* 96*  CO2 25 27  GLUCOSE 114* 116*  BUN 8 <5*  CREATININE 0.65 0.49  CALCIUM 7.5* 7.4*   PT/INR No results for input(s): LABPROT, INR in the last 72 hours. ABG No results for input(s): PHART, HCO3 in the last 72 hours.  Invalid input(s): PCO2, PO2  Studies/Results: No results found.  Anti-infectives: Anti-infectives (From admission, onward)   Start     Dose/Rate Route Frequency Ordered Stop   07/06/17 0800  ciprofloxacin (CIPRO) tablet 500 mg     500 mg Oral 2 times daily 07/06/17 0734 07/13/17 0759   07/04/17 0600  cefoTEtan in Dextrose 5% (CEFOTAN) IVPB 2 g  Status:  Discontinued     2 g Intravenous On call to O.R. 07/03/17 1438 07/03/17 1525   07/04/17 0600  cefoTEtan in Dextrose 5% (CEFOTAN) IVPB 2 g    Comments:  She already has this ordered.  Do not give twice.   2 g Intravenous On call to O.R. 07/03/17 1525 07/03/17 1610   07/03/17 2200  metroNIDAZOLE (FLAGYL) tablet 500 mg     500 mg Oral 3 times daily 07/03/17 2103         Assessment/Plan: Incarcerated left femoral hernia POD#3 S/P HERNIA REPAIR FEMORAL 07/03/17 Dr. Abigail Miyamotoouglas Blackman - will give her regular diet today - avoid narcotics; she does not have much pain - PT/OT eval, ambulate in halls CV: afib converted, cards following, currently on hep gtt  FEN: reg today. Hypokalemia, hypomagnesemia, hypophosphatemia- replace, recheck in am WU:[id number]:perioperative cefotetan 10/31; urine cx 10/31 growing citrobacter freundii on cipro VTE: SCD's, Lovenox  Follow up:Dr. Abigail Miyamotoouglas Blackman  Foley:will schedule I&O caths as she is likely having overflow  dispo remain stepdown due to needs for care with diarrhea and afib overnight now on heparin; appreciate cards and IM assistance  LOS: 3 days    Berna Buehelsea A Hallis Meditz MD 07/07/2017

## 2017-07-07 NOTE — Progress Notes (Signed)
ANTICOAGULATION CONSULT NOTE - Follow Up Consult  Pharmacy Consult for heparin Indication: atrial fibrillation  Labs: Recent Labs    07/05/17 0522 07/06/17 0436 07/06/17 1611 07/07/17 0537  HGB 10.1* 11.5*  --  12.5  HCT 30.8* 34.8*  --  37.0  PLT 235 244  --  294  HEPARINUNFRC  --   --  <0.10* 0.15*  CREATININE 0.60 0.65  --   --     Assessment: 83yo female remains subtherapeutic on heparin after rate increase though closer to goal.  Goal of Therapy:  Heparin level 0.3-0.7 units/ml   Plan:  Will rebolus with heparin 1000 units and increase gtt by 3 units/kg/hr to 900 units/hr and check level in 8hr.  Vernard GamblesVeronda Junnie Loschiavo, PharmD, BCPS  07/07/2017,6:51 AM

## 2017-07-07 NOTE — Progress Notes (Signed)
PROGRESS NOTE  Renee Fritz ZOX:[id number] DOB: Nov 09, 1925 DOA: 07/03/2017 PCP: Lester [place]., MD  HPI/Recap of past 24 hours: States she feels better today. Advancing diet and will be walking with PT. Denies abd pain, nausea, chest pain, dyspnea or palpitations.  Assessment/Plan: Active Problems:   Incarcerated hernia   Incarcerated femoral hernia   AKI (acute kidney injury) (HCC)   Diarrhea   Hypokalemia   Intractable vomiting with nausea   S/P hernia repair  Code Status:Full  Family Communication:No family members at bedside today.  Disposition Plan:Will keep another midnight to continue treatment.   Consultants:  General surgery  Procedures:  Left inguinal hernia repair  Antimicrobials:  Po flagyl for c-diff; pt allergic to vancomycin   DVT prophylaxis: Heparin drip due to paroxysmal a-fib rvr    Objective: Vitals:   07/07/17 0409 07/07/17 0411 07/07/17 1204 07/07/17 1408  BP: 127/64 127/67 120/74 123/77  Pulse: (!) 40 72  92  Resp: 18 18  18   Temp: 98.1 F (36.7 C) 98.1 F (36.7 C)  98.2 F (36.8 C)  TempSrc: Oral Oral  Oral  SpO2:  97%  95%  Weight:      Height:        Intake/Output Summary (Last 24 hours) at 07/07/2017 2024 Last data filed at 07/07/2017 1755 Gross per 24 hour  Intake 1662.42 ml  Output 1650 ml  Net 12.42 ml   Filed Weights   07/03/17 1538 07/03/17 2100  Weight: 40.8 kg (90 lb) 43.8 kg (96 lb 9 oz)    Exam:   General:83 year old caucasian female, thin built, sitting up in a chair. Hard of hearing.  Eyes:Pupils are round and reactive to light  WJX:BJYNWG membranes are moist, no erythema or drainage. NG tube in place, clamped.  Neck:No JVD, or thyromegaly  Cardiovascular:RRR with no murmurs, rubs or gallops  Respiratory:Mild crackles at bases. No wheezes.  Abdomen:+ bowel sounds. Left scaring from surgical incisionlocated at left lower quadrant.  Skin:Scar noted at left lower  quadrant of abdomen  Musculoskeletal:No noted muscular atrophy  Psychiatric:Mood is appropriate for condition and setting.  Neurologic:Alert and oriented x 3. No noted focal motor or sensory deficits     Data Reviewed: CBC: Recent Labs  Lab 07/03/17 0936 07/04/17 0237 07/04/17 0756 07/05/17 0522 07/06/17 0436 07/07/17 0537  WBC 7.9 7.8  --  9.1 11.3* 10.5  NEUTROABS 6.3  --   --  6.7  --   --   HGB 14.3 10.6* 10.7* 10.1* 11.5* 12.5  HCT 40.6 31.1* 30.4* 30.8* 34.8* 37.0  MCV 87.9 88.9  --  91.1 90.9 88.3  PLT 292 205  --  235 244 294   Basic Metabolic Panel: Recent Labs  Lab 07/03/17 0936 07/04/17 0237 07/05/17 0522 07/06/17 0436 07/07/17 0537  NA 129* 133* 139 133* 133*  K 2.9* 3.4* 4.2 3.7 2.5*  CL 75* 93* 105 100* 96*  CO2 36* 29 27 25 27   GLUCOSE 127* 219* 131* 114* 116*  BUN 25* 18 9 8  <5*  CREATININE 1.99* 0.99 0.60 0.65 0.49  CALCIUM 11.0* 8.3* 8.2* 7.5* 7.4*  MG  --   --   --   --  1.0*  PHOS  --   --   --   --  1.1*   GFR: Estimated Creatinine Clearance: 33 mL/min (by C-G formula based on SCr of 0.49 mg/dL). Liver Function Tests: Recent Labs  Lab 07/03/17 0936 07/04/17 0237  AST 40 26  ALT  21 15  ALKPHOS 59 33*  BILITOT 1.3* 0.5  PROT 8.4* 6.0*  ALBUMIN 4.3 3.1*   No results for input(s): LIPASE, AMYLASE in the last 168 hours. No results for input(s): AMMONIA in the last 168 hours. Coagulation Profile: No results for input(s): INR, PROTIME in the last 168 hours. Cardiac Enzymes: No results for input(s): CKTOTAL, CKMB, CKMBINDEX, TROPONINI in the last 168 hours. BNP (last 3 results) No results for input(s): PROBNP in the last 8760 hours. HbA1C: No results for input(s): HGBA1C in the last 72 hours. CBG: Recent Labs  Lab 07/06/17 2334 07/07/17 0358 07/07/17 0830 07/07/17 1606 07/07/17 1732  GLUCAP 86 95 83 205* 99   Lipid Profile: No results for input(s): CHOL, HDL, LDLCALC, TRIG, CHOLHDL, LDLDIRECT in the last 72  hours. Thyroid Function Tests: No results for input(s): TSH, T4TOTAL, FREET4, T3FREE, THYROIDAB in the last 72 hours. Anemia Panel: No results for input(s): VITAMINB12, FOLATE, FERRITIN, TIBC, IRON, RETICCTPCT in the last 72 hours. Urine analysis:    Component Value Date/Time   COLORURINE AMBER (A) 07/03/2017 0954   APPEARANCEUR CLOUDY (A) 07/03/2017 0954   LABSPEC 1.017 07/03/2017 0954   PHURINE 5.0 07/03/2017 0954   GLUCOSEU NEGATIVE 07/03/2017 0954   HGBUR NEGATIVE 07/03/2017 0954   BILIRUBINUR NEGATIVE 07/03/2017 0954   KETONESUR 5 (A) 07/03/2017 0954   PROTEINUR 100 (A) 07/03/2017 0954   NITRITE NEGATIVE 07/03/2017 0954   LEUKOCYTESUR SMALL (A) 07/03/2017 0954   Sepsis Labs: @LABRCNTIP (procalcitonin:4,lacticidven:4)  ) Recent Results (from the past 240 hour(s))  Urine culture     Status: Abnormal   Collection Time: 07/03/17 11:14 AM  Result Value Ref Range Status   Specimen Description URINE, RANDOM  Final   Special Requests NONE  Final   Culture >=100,000 COLONIES/mL CITROBACTER FREUNDII (A)  Final   Report Status 07/05/2017 FINAL  Final   Organism ID, Bacteria CITROBACTER FREUNDII (A)  Final      Susceptibility   Citrobacter freundii - MIC*    CEFAZOLIN >=64 RESISTANT Resistant     CEFTRIAXONE <=1 SENSITIVE Sensitive     CIPROFLOXACIN <=0.25 SENSITIVE Sensitive     GENTAMICIN <=1 SENSITIVE Sensitive     IMIPENEM <=0.25 SENSITIVE Sensitive     NITROFURANTOIN <=16 SENSITIVE Sensitive     TRIMETH/SULFA <=20 SENSITIVE Sensitive     PIP/TAZO <=4 SENSITIVE Sensitive     * >=100,000 COLONIES/mL CITROBACTER FREUNDII  C difficile quick scan w PCR reflex     Status: None   Collection Time: 07/03/17  1:01 PM  Result Value Ref Range Status   C Diff antigen NEGATIVE NEGATIVE Final   C Diff toxin NEGATIVE NEGATIVE Final   C Diff interpretation No C. difficile detected.  Final  MRSA PCR Screening     Status: Abnormal   Collection Time: 07/03/17  9:01 PM  Result Value Ref  Range Status   MRSA by PCR POSITIVE (A) NEGATIVE Final    Comment:        The GeneXpert MRSA Assay (FDA approved for NASAL specimens only), is one component of a comprehensive MRSA colonization surveillance program. It is not intended to diagnose MRSA infection nor to guide or monitor treatment for MRSA infections. RESULT CALLED TO, READ BACK BY AND VERIFIED WITH: T.NELSON RN 07/03/17 2334 L.CHAMPION       Studies: No results found.  Scheduled Meds: . amLODipine  5 mg Oral Daily  . [START ON 07/08/2017] aspirin  81 mg Oral Daily  . atenolol  100 mg  Oral BID  . ciprofloxacin  500 mg Oral BID  . insulin aspart  0-9 Units Subcutaneous Q4H  . Mesalamine  800 mg Oral TID  . metoprolol tartrate  5 mg Intravenous Once  . metroNIDAZOLE  500 mg Oral TID    Continuous Infusions: . potassium PHOSPHATE IVPB (mEq) 40 mEq (07/07/17 1617)     LOS: 4 days   Assessment/Plan  Left femoral hernia with incarcerated small bowel s/p repair POD#3 -General surgery, Dr Magnus IvanBlackman following; we appreciate recommendations -Pain management IV tylenol 1000 mg BID for 1 day. Avoid narcotics. -full liquid, advance diet -Continue with close monitoring of symptoms and NG tube collection  Paroxysmal A-fib with RVR - back in sinus - CHADSVASC score 4 - heparin drip - HASBLED score of 2 moderate risk of major bleeding - High risk for fall. Family and pt will give final decision on starting oral anticoag   AKI on CKD stage 4 -resolved; -gentle IV fluid hydration with NS at 75 cc/hr  Elevated anion gap in the setting of elevated lactic acid and worsening renal function -resolved -continue IV fluid hydration   Recent c-diff colitis with self reported recurrence x 2 -treated in the outpatient stating on po flagyl -allergic to vancomycin - continue po flagyl - c-diff pcr negative  Elevated lactic acid - improving  Hyponatremia/hypokalemia/hypomagnesemia -Most likely 2/2 to  diarrhea -resolved -Repeat BMP in the am  Ulcerative colitis -continue mesalamine  HLD -conitnue simvastatin  HTN -continue amlodipine and atenolol  Deconditioning -OOB to chair -PT consulted; we appreciate recommendations -Fall precaution   Darlin Droparole N Ethleen Lormand, MD [facility] Pager [phone]  If 7PM-7AM, please contact night-coverage [email or web address] Password [email or web address] 07/07/2017, 8:24 PM

## 2017-07-07 NOTE — Progress Notes (Signed)
Patient HR elevated to 140-150s sustaining in A.Fib rhythm.  No complaints from patient and NP [place] notified.  After 15 minutes patient returned to HR of 88 without intervention.  Will continue to monitor and notify as needed.

## 2017-07-08 LAB — CBC
HEMATOCRIT: 31.4 % — AB (ref 36.0–46.0)
HEMOGLOBIN: 10.7 g/dL — AB (ref 12.0–15.0)
MCH: 29.6 pg (ref 26.0–34.0)
MCHC: 34.1 g/dL (ref 30.0–36.0)
MCV: 87 fL (ref 78.0–100.0)
Platelets: 285 10*3/uL (ref 150–400)
RBC: 3.61 MIL/uL — ABNORMAL LOW (ref 3.87–5.11)
RDW: 14.6 % (ref 11.5–15.5)
WBC: 7 10*3/uL (ref 4.0–10.5)

## 2017-07-08 LAB — BASIC METABOLIC PANEL
ANION GAP: 8 (ref 5–15)
BUN: 5 mg/dL — ABNORMAL LOW (ref 6–20)
CO2: 22 mmol/L (ref 22–32)
Calcium: 6.6 mg/dL — ABNORMAL LOW (ref 8.9–10.3)
Chloride: 102 mmol/L (ref 101–111)
Creatinine, Ser: 0.51 mg/dL (ref 0.44–1.00)
GFR calc Af Amer: >60 mL/min (ref >60)
GLUCOSE: 94 mg/dL (ref 65–99)
POTASSIUM: 3.8 mmol/L (ref 3.5–5.1)
Sodium: 132 mmol/L — ABNORMAL LOW (ref 135–145)

## 2017-07-08 LAB — GLUCOSE, CAPILLARY
GLUCOSE-CAPILLARY: 101 mg/dL — AB (ref 65–99)
GLUCOSE-CAPILLARY: 93 mg/dL (ref 65–99)
Glucose-Capillary: 133 mg/dL — ABNORMAL HIGH (ref 65–99)
Glucose-Capillary: 96 mg/dL (ref 65–99)
Glucose-Capillary: 97 mg/dL (ref 65–99)

## 2017-07-08 LAB — MAGNESIUM: Magnesium: 1.9 mg/dL (ref 1.7–2.4)

## 2017-07-08 LAB — PHOSPHORUS: PHOSPHORUS: 1.6 mg/dL — AB (ref 2.5–4.6)

## 2017-07-08 NOTE — Discharge Summary (Signed)
Discharge Summary  Renee Fritz RUE:[id number] DOB: [birth year]  PCP: Lester [place]., MD  Admit date: 07/03/2017 Discharge date: 07/08/2017  Time spent: 25 minutes  Recommendations for Outpatient Follow-up:  1. Follow up with cardiology within a week 2. Follow up with General surgery within a week 3. Continue physical therapy at home. Patient declined SNF rehab.  Discharge Diagnoses:  Active Hospital Problems   Diagnosis Date Noted  . AKI (acute kidney injury) (HCC)   . Diarrhea   . Hypokalemia   . Intractable vomiting with nausea   . S/P hernia repair   . Incarcerated hernia 07/03/2017  . Incarcerated femoral hernia 07/03/2017    Resolved Hospital Problems  No resolved problems to display.    Discharge Condition: Stable; cleared by cardiology and general surgery.  Diet recommendation: Avoid greasy foods and NSAIDS use  Vitals:   07/07/17 2204 07/08/17 0414  BP: 132/64 113/61  Pulse: 85 87  Resp: 18 17  Temp: 97.6 F (36.4 C) (!) 97.5 F (36.4 C)  SpO2: 99% 96%    History of present illness:  Renee Fritz is a 83 y.o. female with medical history significant for ulcerative colitis, HTN, CAD, recently treated c-difficile colitis, left femorl hernia with incarcerated small bowel, POD#0 post  Incarcerated left hernia femoral repair. The patient reports intractable abdominal pain, nausea vomiting and diarrhea of 3 day duration. The medicine was asked to evaluate and admit the patient pos surgery due to her multiple comorbidities.    Hospital Course:  Active Problems:   Incarcerated hernia   Incarcerated femoral hernia   AKI (acute kidney injury) (HCC)   Diarrhea   Hypokalemia   Intractable vomiting with nausea   S/P hernia repair  Left femoral hernia with incarcerated small bowel s/p repair POD#5 -General surgery, Dr Magnus Ivan following -Avoid narcotics.  Paroxysmal A-fib with RVR - back in sinus - CHADSVASC score 4 - heparin drip in the hospital -  HASBLED score of 2 moderate risk of major bleeding - High risk for fall.  - Defer to cardiology to start oral anticoagulation - Cardiology will follow up with the patient to see if the A-fib was 2/2 to surgery or recurrent  AKI on CKD stage 4 -resolved; -keep hydrated  Elevated anion gap in the setting of elevated lactic acid and worsening renal function -resolved -keep hydrated  Recent c-diff colitis with self reported recurrence x 2 -treated in the outpatient stating on po flagyl -allergic to vancomycin -c-diff pcrnegative in the hospital  Elevated lactic acid -resolved  Hyponatremia/hypokalemia/hypomagnesemia -Most likely 2/2 to diarrhea -resolved  Ulcerative colitis -continue mesalamine  HLD -conitnue simvastatin  HTN -continue amlodipine and atenolol  Deconditioning/ambulatory dysfunction -Fall precaution -continue physical therapy at home    Procedures:  POD#5 post incarcerated abdominal hernia repair  Consultations:  Cardiology, general surgery  Discharge Exam: BP 113/61 (BP Location: Left Arm)   Pulse 87   Temp (!) 97.5 F (36.4 C) (Oral)   Resp 17   Ht 5' (1.524 m)   Wt 43.8 kg (96 lb 9 oz)   SpO2 96%   BMI 18.86 kg/m   General: 83 yo CF thin built sitting up in a chair in NAD Cardiovascular: RRR with no murmurs rubs or gallops Respiratory: CTA with no wheezes or rhonchi   Discharge Instructions You were cared for by a hospitalist during your hospital stay. If you have any questions about your discharge medications or the care you received while you were in the hospital after you  are discharged, you can call the unit and asked to speak with the hospitalist on call if the hospitalist that took care of you is not available. Once you are discharged, your primary care physician will handle any further medical issues. Please note that NO REFILLS for any discharge medications will be authorized once you are discharged, as it is imperative  that you return to your primary care physician (or establish a relationship with a primary care physician if you do not have one) for your aftercare needs so that they can reassess your need for medications and monitor your lab values.  Discharge Instructions    Amb referral to [facility]   Complete by:  As directed      Allergies as of 07/08/2017      Reactions   Phenergan [promethazine Hcl] Other (See Comments)   Feels like bugs crawling all over   Vancomycin Diarrhea, Other (See Comments)   Stomach pains   Erythromycin-sulfisoxazole Rash   Sulfa Antibiotics Rash      Medication List    STOP taking these medications   vancomycin 125 MG capsule Commonly known as:  VANCOCIN     TAKE these medications   amLODipine 5 MG tablet Commonly known as:  NORVASC Take 5 mg by mouth daily.   ASACOL HD 800 MG Tbec Generic drug:  Mesalamine Take 800 mg by mouth 3 (three) times daily.   aspirin 81 MG chewable tablet Chew by mouth daily.   atenolol 100 MG tablet Commonly known as:  TENORMIN Take 100 mg by mouth 2 (two) times daily.   busPIRone 5 MG tablet Commonly known as:  BUSPAR Take 5 mg by mouth 2 (two) times daily.   ciprofloxacin 100 MG tablet Commonly known as:  CIPRO Take 100 mg by mouth 2 (two) times daily.   ezetimibe-simvastatin 10-20 MG tablet Commonly known as:  VYTORIN Take 1 tablet by mouth daily.   FOLBEE PLUS CZ 5 MG Tabs Take 1 tablet by mouth daily.   hydrochlorothiazide 25 MG tablet Commonly known as:  HYDRODIURIL Take 25 mg by mouth daily.   hydrocortisone 25 MG suppository Commonly known as:  ANUSOL-HC Place 25 mg rectally 2 (two) times daily.   metroNIDAZOLE 500 MG tablet Commonly known as:  FLAGYL Take 500 mg by mouth 3 (three) times daily.   ondansetron 8 MG disintegrating tablet Commonly known as:  ZOFRAN-ODT Take 8 mg by mouth every 8 (eight) hours as needed for nausea or vomiting.   potassium chloride 10 MEQ tablet Commonly known  as:  K-DUR,KLOR-CON Take 10 mEq by mouth 2 (two) times daily.   RABEprazole 20 MG tablet Commonly known as:  ACIPHEX Take 20 mg by mouth 2 (two) times daily.      Allergies  Allergen Reactions  . Phenergan [Promethazine Hcl] Other (See Comments)    Feels like bugs crawling all over  . Vancomycin Diarrhea and Other (See Comments)    Stomach pains  . Erythromycin-Sulfisoxazole Rash  . Sulfa Antibiotics Rash   Follow-up Information    Abigail Miyamoto, MD Follow up.   Specialty:  General Surgery Why:  call as soon as possible to schedule follow up appointment in 2-4 weeks.   Contact information: [address] [place] [address] [phone]        Lester Cowan., MD Follow up.   Specialty:  Family Medicine Contact information: [address] [place] [address] [phone]  The results of significant diagnostics from this hospitalization (including imaging, microbiology, ancillary and laboratory) are listed below for reference.    Significant Diagnostic Studies: Ct Abdomen Pelvis Wo Contrast  Result Date: 07/03/2017 CLINICAL DATA:  Nausea and vomiting for 3 days. EXAM: CT ABDOMEN AND PELVIS WITHOUT CONTRAST TECHNIQUE: Multidetector CT imaging of the abdomen and pelvis was performed following the standard protocol without IV contrast. COMPARISON:  None. FINDINGS: Lower chest: Bibasilar atelectasis/ scarring. Hepatobiliary: No focal liver abnormality is seen. Status post cholecystectomy. No biliary dilatation. Pancreas: Mild atrophy. No ductal dilatation or surrounding inflammatory changes. Spleen: Normal in size without focal abnormality. Adrenals/Urinary Tract: The adrenal glands are unremarkable. There are 2 simple cysts in the lower pole of the right kidney measuring up to 1.6 cm. No renal or ureteral calculi. No hydronephrosis. The bladder is decompressed. Stomach/Bowel: Multiple dilated loops of small bowel are noted with a  transition point in a left femoral hernia. No wall thickening or pneumatosis. The stomach is distended. The large bowel is decompressed. Bowel sutures are noted in the distal sigmoid colon. The appendix is normal. Vascular/Lymphatic: Aortic atherosclerosis. No enlarged abdominal or pelvic lymph nodes. Reproductive: Uterus and bilateral adnexa are unremarkable. Other: No free fluid or pneumoperitoneum. Musculoskeletal: No acute or significant osseous findings. T11 and T12 compression deformities status post vertebroplasty. Prior left proximal femur ORIF. IMPRESSION: 1. Left femoral hernia containing incarcerated small bowel with resultant high-grade obstruction. No wall thickening, pneumatosis, or free fluid. 2.  Aortic atherosclerosis (ICD10-I70.0). Electronically Signed   By: Obie DredgeWilliam T Derry M.D.   On: 07/03/2017 13:47   Dg Chest 2 View  Result Date: 07/03/2017 CLINICAL DATA:  Hypoxia, recent fall EXAM: CHEST  2 VIEW COMPARISON:  08/23/2016 FINDINGS: Cardiac shadow is at the upper limits of normal in size. Aortic calcifications are noted. Changes of prior vertebral augmentation are seen. Prior healed right clavicle and right rib fractures are seen. No focal infiltrate or sizable effusion is seen. No pneumothorax is noted. Old non treated compression fractures are noted in the upper and lower thoracic spine stable from the prior study. IMPRESSION: No acute abnormality noted.  Chronic changes as described above. Electronically Signed   By: Alcide CleverMark  Lukens M.D.   On: 07/03/2017 10:31   Dg Abd Portable 1v  Result Date: 07/03/2017 CLINICAL DATA:  Hernia repair, nasogastric tube positioning. EXAM: PORTABLE ABDOMEN - 1 VIEW COMPARISON:  CT abdomen 07/03/2017 FINDINGS: The nasogastric tube enters the stomach and its curvature would suggest positioning in the stomach body. Lower thoracic vertebral augmentations. Old bilateral rib fractures with callus formation. Indistinct airspace opacities medially at the left lung  base. IMPRESSION: 1. Nasogastric tube tip is in the stomach body. 2. Left basilar airspace opacities are nonspecific but pneumonia is not excluded. Electronically Signed   By: Gaylyn RongWalter  Liebkemann M.D.   On: 07/03/2017 18:08    Microbiology: Recent Results (from the past 240 hour(s))  Urine culture     Status: Abnormal   Collection Time: 07/03/17 11:14 AM  Result Value Ref Range Status   Specimen Description URINE, RANDOM  Final   Special Requests NONE  Final   Culture >=100,000 COLONIES/mL CITROBACTER FREUNDII (A)  Final   Report Status 07/05/2017 FINAL  Final   Organism ID, Bacteria CITROBACTER FREUNDII (A)  Final      Susceptibility   Citrobacter freundii - MIC*    CEFAZOLIN >=64 RESISTANT Resistant     CEFTRIAXONE <=1 SENSITIVE Sensitive     CIPROFLOXACIN <=0.25 SENSITIVE Sensitive  GENTAMICIN <=1 SENSITIVE Sensitive     IMIPENEM <=0.25 SENSITIVE Sensitive     NITROFURANTOIN <=16 SENSITIVE Sensitive     TRIMETH/SULFA <=20 SENSITIVE Sensitive     PIP/TAZO <=4 SENSITIVE Sensitive     * >=100,000 COLONIES/mL CITROBACTER FREUNDII  C difficile quick scan w PCR reflex     Status: None   Collection Time: 07/03/17  1:01 PM  Result Value Ref Range Status   C Diff antigen NEGATIVE NEGATIVE Final   C Diff toxin NEGATIVE NEGATIVE Final   C Diff interpretation No C. difficile detected.  Final  MRSA PCR Screening     Status: Abnormal   Collection Time: 07/03/17  9:01 PM  Result Value Ref Range Status   MRSA by PCR POSITIVE (A) NEGATIVE Final    Comment:        The GeneXpert MRSA Assay (FDA approved for NASAL specimens only), is one component of a comprehensive MRSA colonization surveillance program. It is not intended to diagnose MRSA infection nor to guide or monitor treatment for MRSA infections. RESULT CALLED TO, READ BACK BY AND VERIFIED WITH: T.NELSON RN 07/03/17 2334 L.CHAMPION      Labs: Basic Metabolic Panel: Recent Labs  Lab 07/04/17 0237 07/05/17 0522  07/06/17 0436 07/07/17 0537 07/08/17 0711  NA 133* 139 133* 133* 132*  K 3.4* 4.2 3.7 2.5* 3.8  CL 93* 105 100* 96* 102  CO2 29 27 25 27 22   GLUCOSE 219* 131* 114* 116* 94  BUN 18 9 8  <5* 5*  CREATININE 0.99 0.60 0.65 0.49 0.51  CALCIUM 8.3* 8.2* 7.5* 7.4* 6.6*  MG  --   --   --  1.0* 1.9  PHOS  --   --   --  1.1* 1.6*   Liver Function Tests: Recent Labs  Lab 07/03/17 0936 07/04/17 0237  AST 40 26  ALT 21 15  ALKPHOS 59 33*  BILITOT 1.3* 0.5  PROT 8.4* 6.0*  ALBUMIN 4.3 3.1*   No results for input(s): LIPASE, AMYLASE in the last 168 hours. No results for input(s): AMMONIA in the last 168 hours. CBC: Recent Labs  Lab 07/03/17 0936 07/04/17 0237 07/04/17 0756 07/05/17 0522 07/06/17 0436 07/07/17 0537 07/08/17 0711  WBC 7.9 7.8  --  9.1 11.3* 10.5 7.0  NEUTROABS 6.3  --   --  6.7  --   --   --   HGB 14.3 10.6* 10.7* 10.1* 11.5* 12.5 10.7*  HCT 40.6 31.1* 30.4* 30.8* 34.8* 37.0 31.4*  MCV 87.9 88.9  --  91.1 90.9 88.3 87.0  PLT 292 205  --  235 244 294 285   Cardiac Enzymes: No results for input(s): CKTOTAL, CKMB, CKMBINDEX, TROPONINI in the last 168 hours. BNP: BNP (last 3 results) No results for input(s): BNP in the last 8760 hours.  ProBNP (last 3 results) No results for input(s): PROBNP in the last 8760 hours.  CBG: Recent Labs  Lab 07/07/17 2036 07/08/17 0021 07/08/17 0412 07/08/17 0751 07/08/17 1212  GLUCAP 104* 133* 96 93 97       Signed:  Darlin Drop, MD [facility] 07/08/2017, 1:31 PM

## 2017-07-08 NOTE — Care Management Note (Signed)
Case Management Note  Patient Details  Name: Renee JewelsDorothy Fritz MRN: [id number] Date of Birth: 09-30-1925  Subjective/Objective:                    Action/Plan:   Expected Discharge Date:  07/08/17               Expected Discharge Plan:  Home w Home Health Services  In-House Referral:     Discharge planning Services  CM Consult  Post Acute Care Choice:  Home Health Choice offered to:  Adult Children  DME Arranged:    DME Agency:     HH Arranged:  PT HH Agency:  [facility]  Status of Service:  Completed, signed off  If discussed at Long Length of Stay Meetings, dates discussed:    Additional Comments:  [name], [name], RN 07/08/2017, 2:00 PM

## 2017-07-08 NOTE — Progress Notes (Signed)
Occupational Therapy Treatment Patient Details Name: Renee Fritz MRN: [id number] DOB: December 13, 1925 Today's Date: 07/08/2017    History of present illness Pt is 83 y.o. female s/p incarcerated left femoral hernia repair with SBO. PMH includes ulcerative colitis, HTN, CAD, recenulcerative colitis, HTN, and CAD.   OT comments  Pt progressing towards goals, completing room level functional mobility, standing grooming ADLs, and toileting at RW level during session with MinGuard assist during task completion. Pt required MinA for LB dressing to don socks. Feel Pt will safely progress home with 24 supervision/assist from family initially. Will continue to follow acutely to progress Pt's overall safety and independence with ADLs and functional mobility.    Follow Up Recommendations  No OT follow up;Supervision/Assistance - 24 hour    Equipment Recommendations  None recommended by OT          Precautions / Restrictions Precautions Precautions: Fall Restrictions Weight Bearing Restrictions: No       Mobility Bed Mobility Overal bed mobility: Needs Assistance Bed Mobility: Supine to Sit     Supine to sit: Min guard;HOB elevated     General bed mobility comments: close guard for safety  Transfers Overall transfer level: Needs assistance Equipment used: Rolling walker (2 wheeled) Transfers: Sit to/from Stand Sit to Stand: Min guard         General transfer comment: close guard for safety; verbal cues for hand/RW placement when preparing to sit in recliner     Balance Overall balance assessment: Needs assistance Sitting-balance support: Feet unsupported;Single extremity supported Sitting balance-Leahy Scale: Good     Standing balance support: Bilateral upper extremity supported;Single extremity supported;During functional activity   Standing balance comment: Requires UE support for safety/balance in static and dynamic standing.                           ADL  either performed or assessed with clinical judgement   ADL Overall ADL's : Needs assistance/impaired     Grooming: Wash/dry face;Oral care;Brushing hair;Min guard;Standing Grooming Details (indicate cue type and reason): close guard for safety in standing              Lower Body Dressing: Sit to/from stand;Minimal assistance Lower Body Dressing Details (indicate cue type and reason): Pt able to doff socks seated on BSC, required assist to don new pair of socks however suspect partly due to increased height of BSC Toilet Transfer: Min guard;Ambulation;BSC;RW Toilet Transfer Details (indicate cue type and reason): Noted Pt to have incontinent loose stools while standing at sink; [facility] to continue toileting task  Toileting- Clothing Manipulation and Hygiene: Sit to/from stand;Minimal assistance Toileting - Clothing Manipulation Details (indicate cue type and reason): Pt able to complete peri-care after with close guard for safety while standing at RW and with setup assist      Functional mobility during ADLs: Min guard;Rolling walker General ADL Comments: Pt stood at sink >5 min while completing grooming ADLs and while talking to PA with close guard for safety during static standing; Pt on 4L O2 during session completion                        Cognition Arousal/Alertness: Awake/alert Behavior During Therapy: [facility]FL for tasks assessed/performed;Impulsive Overall Cognitive Status: Within Functional Limits for tasks assessed  Pertinent Vitals/ Pain       Pain Assessment: No/denies pain                                                          Frequency  Min 2X/week        Progress Toward Goals  OT Goals(current goals can now be found in the care plan section)  Progress towards OT goals: Progressing toward goals  Acute Rehab OT Goals Patient  Stated Goal: to go home OT Goal Formulation: With patient Time For Goal Achievement: 07/19/17 Potential to Achieve Goals: Good  Plan Discharge plan remains appropriate                     AM-PAC PT "6 Clicks" Daily Activity     Outcome Measure   Help from another person eating meals?: None Help from another person taking care of personal grooming?: A Little Help from another person toileting, which includes using toliet, bedpan, or urinal?: A Little Help from another person bathing (including washing, rinsing, drying)?: A Little Help from another person to put on and taking off regular upper body clothing?: A Little Help from another person to put on and taking off regular lower body clothing?: A Little 6 Click Score: 19    End of Session Equipment Utilized During Treatment: Rolling walker;Gait belt;Oxygen  OT Visit Diagnosis: Unsteadiness on feet (R26.81);Muscle weakness (generalized) (M62.81)   Activity Tolerance Patient tolerated treatment well   Patient Left in chair;with call bell/phone within reach;with chair alarm set   Nurse Communication Mobility status        Time: 1610-9604 OT Time Calculation (min): 44 min  Charges: OT General Charges $OT Visit: 1 Visit OT Treatments $Self Care/Home Management : 38-52 mins  Marcy Siren, [place] Pager [phone] 07/08/2017    Orlando Penner 07/08/2017, 11:37 AM

## 2017-07-08 NOTE — Care Management (Signed)
PT recommending home health PT and shower chair . Paged Dr Margo AyeHall for orders. Spoke with patient's nurse Tresa EndoKelly who stated she has done home oxygen evaluation and patient does not need home oxygen. Ronny FlurryHeather Taishawn Smaldone RN BSN [phone]

## 2017-07-08 NOTE — Progress Notes (Signed)
In and out catheterization performed at the bedside, Renee CritchleyMarie Trogdon, RN assisting. Sterile technique was maintained throughout the procedure. Pt tolerated well, 1000 ml of urine was collected.

## 2017-07-08 NOTE — Progress Notes (Signed)
Physical Therapy Treatment Patient Details Name: Renee Fritz MRN: [id number] DOB: [birth year] Today's Date: 07/08/2017    History of Present Illness Pt is 83 y.o. female s/p incarcerated left femoral hernia repair with SBO. PMH includes ulcerative colitis, HTN, CAD, recenulcerative colitis, HTN, and CAD.    PT Comments    Pt ambulated today in hall with straight cane. Overall steady gait and good technique with cane. Pt with decreased stride length. No LOB when challenging gait with head turns and pivot turns. Pt is progressing towards goals and would benefit from continued skilled PT to achieve unmet goals. Expected to d/c today with HHPT to follow up.     Follow Up Recommendations  Home health PT;Supervision for mobility/OOB     Equipment Recommendations  Other (comment)    Recommendations for Other Services       Precautions / Restrictions Precautions Precautions: Fall Restrictions Weight Bearing Restrictions: No    Mobility  Bed Mobility Overal bed mobility: Needs Assistance Bed Mobility: Supine to Sit     Supine to sit: Min guard;HOB elevated     General bed mobility comments: in chair on arrival  Transfers Overall transfer level: Needs assistance Equipment used: None Transfers: Sit to/from Stand Sit to Stand: Min guard         General transfer comment: min guard for safety. No assist required  Ambulation/Gait Ambulation/Gait assistance: Min guard Ambulation Distance (Feet): 220 Feet Assistive device: Straight cane Gait Pattern/deviations: Step-through pattern;Narrow base of support;Decreased stride length Gait velocity: decreased   General Gait Details: Pt ambulated in hall. Initially reached to PTA for HHA on L and had cane on R, however after the first 20 feet, pt let go of PTA's hand and appeared overall steady with cane. Min guard for safety and cueing to continue using cane when negotiating around objects.   Stairs            Wheelchair  Mobility    Modified Rankin (Stroke Patients Only)       Balance Overall balance assessment: Needs assistance Sitting-balance support: Feet unsupported;Single extremity supported Sitting balance-Leahy Scale: Good     Standing balance support: Bilateral upper extremity supported;Single extremity supported;During functional activity Standing balance-Leahy Scale: Poor Standing balance comment: Requires UE support for safety/balance in static and dynamic standing.                            Cognition Arousal/Alertness: Awake/alert Behavior During Therapy: WFL for tasks assessed/performed;Impulsive Overall Cognitive Status: Within Functional Limits for tasks assessed                                 General Comments: hard of hearing      Exercises      General Comments        Pertinent Vitals/Pain Pain Assessment: No/denies pain    Home Living                      Prior Function            PT Goals (current goals can now be found in the care plan section) Acute Rehab PT Goals Patient Stated Goal: to go home PT Goal Formulation: With patient Time For Goal Achievement: 07/18/17 Potential to Achieve Goals: Good Progress towards PT goals: Progressing toward goals    Frequency    Min 3X/week  PT Plan Current plan remains appropriate    Co-evaluation              AM-PAC PT "6 Clicks" Daily Activity  Outcome Measure  Difficulty turning over in bed (including adjusting bedclothes, sheets and blankets)?: None Difficulty moving from lying on back to sitting on the side of the bed? : A Little Difficulty sitting down on and standing up from a chair with arms (e.g., wheelchair, bedside commode, etc,.)?: A Little Help needed moving to and from a bed to chair (including a wheelchair)?: A Little Help needed walking in hospital room?: A Little Help needed climbing 3-5 steps with a railing? : A Lot 6 Click Score: 18    End  of Session Equipment Utilized During Treatment: Gait belt Activity Tolerance: Patient tolerated treatment well Patient left: in chair;with family/visitor present;with call bell/phone within reach;with chair alarm set Nurse Communication: Mobility status PT Visit Diagnosis: Unsteadiness on feet (R26.81);Muscle weakness (generalized) (M62.81);Other abnormalities of gait and mobility (R26.89)     Time: [id number] PT Time Calculation (min) (ACUTE ONLY): 16 min  Charges:  $Gait Training: 8-22 mins                    G Codes:       Renee Fritz, [place] Pager [phone] Acute Rehab   Renee Fritz 07/08/2017, 12:17 PM

## 2017-07-08 NOTE — Progress Notes (Signed)
[facility] Progress Note  5 Days Post-Op  Subjective: CC:  Feels much better since her bladder was emptied. Denies abdominal pain. Having large amounts of flatus. Tolerating PO. Up at the sink now with OT, washing her face. Feels more "steady" today.    Objective: Vital signs in last 24 hours: Temp:  [97.5 F (36.4 C)-98.2 F (36.8 C)] 97.5 F (36.4 C) (11/05 0414) Pulse Rate:  [85-92] 87 (11/05 0414) Resp:  [17-18] 17 (11/05 0414) BP: (113-132)/(61-77) 113/61 (11/05 0414) SpO2:  [95 %-99 %] 96 % (11/05 0414) Last BM Date: 07/07/17  Intake/Output from previous day: 11/04 0701 - 11/05 0700 In: 609.1 [IV Piggyback:609.1] Out: 1300 [Urine:1300] Intake/Output this shift: No intake/output data recorded.  PE: Gen:  Alert, NAD, pleasant Card:  Regular rate and rhythm, pedal pulses 2+ BL Pulm:  Normal effort, clear to auscultation bilaterally Abd: Soft, non-tender, non-distended, bowel sounds present in all 4 quadrants, no HSM, incisions C/D/I Skin: warm and dry, no rashes  Psych: A&Ox3   Lab Results:  Recent Labs    07/06/17 0436 07/07/17 0537  WBC 11.3* 10.5  HGB 11.5* 12.5  HCT 34.8* 37.0  PLT 244 294   BMET Recent Labs    07/06/17 0436 07/07/17 0537  NA 133* 133*  K 3.7 2.5*  CL 100* 96*  CO2 25 27  GLUCOSE 114* 116*  BUN 8 <5*  CREATININE 0.65 0.49  CALCIUM 7.5* 7.4*   PT/INR No results for input(s): LABPROT, INR in the last 72 hours. CMP     Component Value Date/Time   NA 133 (L) 07/07/2017 0537   K 2.5 (LL) 07/07/2017 0537   CL 96 (L) 07/07/2017 0537   CO2 27 07/07/2017 0537   GLUCOSE 116 (H) 07/07/2017 0537   BUN <5 (L) 07/07/2017 0537   CREATININE 0.49 07/07/2017 0537   CALCIUM 7.4 (L) 07/07/2017 0537   PROT 6.0 (L) 07/04/2017 0237   ALBUMIN 3.1 (L) 07/04/2017 0237   AST 26 07/04/2017 0237   ALT 15 07/04/2017 0237   ALKPHOS 33 (L) 07/04/2017 0237   BILITOT 0.5 07/04/2017 0237   GFRNONAA >60 07/07/2017 0537   GFRAA >60  07/07/2017 0537   Lipase  No results found for: LIPASE     Studies/Results: No results found.  Anti-infectives: Anti-infectives (From admission, onward)   Start     Dose/Rate Route Frequency Ordered Stop   07/06/17 0800  ciprofloxacin (CIPRO) tablet 500 mg     500 mg Oral 2 times daily 07/06/17 0734 07/13/17 0759   07/04/17 0600  cefoTEtan in Dextrose 5% (CEFOTAN) IVPB 2 g  Status:  Discontinued     2 g Intravenous On call to O.R. 07/03/17 1438 07/03/17 1525   07/04/17 0600  cefoTEtan in Dextrose 5% (CEFOTAN) IVPB 2 g    Comments:  She already has this ordered.  Do not give twice.   2 g Intravenous On call to O.R. 07/03/17 1525 07/03/17 1610   07/03/17 2200  metroNIDAZOLE (FLAGYL) tablet 500 mg     500 mg Oral 3 times daily 07/03/17 2103         Assessment/Plan Incarcerated left femoral hernia POD#5 S/P HERNIA REPAIR FEMORAL 07/03/17 Dr. Abigail Miyamoto - regular diet  - +flatus and BM - avoid narcotics; she does not have much pain  - PT/OT  CV: afib converted, cards following, IV heparin d/c-ed by cardiology, appreciate their assistance. FEN: reg today. Electrolytes improved, phos still low - replace per medical team.  ZO:[id number]:perioperative cefotetan 10/31; PO flagyl for c.dif 10/31 >>;  urine cx 10/31 growing citrobacter freundii on cipro 11/3 >> VTE: SCD's, Lovenox  Follow up:Dr. Abigail Miyamotoouglas Blackman, [facility] cardiology  Foley: schedule I&O caths   Plan: stable for discharge with outpatient follow up as above.    LOS: 5 days    Adam PhenixElizabeth S Simaan , [facility] CampusA-[facility] 07/08/2017, 8:29 AM Pager: [phone] Consults: [phone] Mon-Fri 7:00 am-4:30 pm Sat-Sun 7:00 am-11:30 am

## 2017-07-08 NOTE — Progress Notes (Signed)
SATURATION QUALIFICATIONS: (This note is used to comply with regulatory documentation for home oxygen)  Patient Saturations on Room Air at Rest = 98%  Patient Saturations on Room Air while Ambulating = 93%   Please briefly explain why patient needs home oxygen: pt does not qualify for home O2, pt  Denies SOB, dizziness or lightheadedness.

## 2017-07-08 NOTE — Progress Notes (Signed)
Daughter of pt stated she has a rental car and its small, it would not fit the shower chair in it. She is going to have her brother pick up the chair here at the hospital tomorrow.

## 2017-07-12 ENCOUNTER — Telehealth (HOSPITAL_COMMUNITY): Payer: Self-pay | Admitting: *Deleted

## 2017-07-12 NOTE — Telephone Encounter (Signed)
Pt referred to afib from hospital..  Phone rings busy.  Will try again at a later time

## 2017-07-16 ENCOUNTER — Encounter (HOSPITAL_COMMUNITY): Payer: Self-pay | Admitting: Surgery

## 2017-08-30 ENCOUNTER — Encounter: Payer: Self-pay | Admitting: Cardiology

## 2019-02-21 ENCOUNTER — Inpatient Hospital Stay (HOSPITAL_BASED_OUTPATIENT_CLINIC_OR_DEPARTMENT_OTHER)
Admission: EM | Admit: 2019-02-21 | Discharge: 2019-02-24 | DRG: 065 | Disposition: A | Discharge status: Rehabilitation Facility | Payer: Medicare Other | Attending: Internal Medicine | Admitting: Internal Medicine

## 2019-02-21 ENCOUNTER — Emergency Department (HOSPITAL_BASED_OUTPATIENT_CLINIC_OR_DEPARTMENT_OTHER): Payer: Medicare Other

## 2019-02-21 ENCOUNTER — Other Ambulatory Visit: Payer: Self-pay

## 2019-02-21 ENCOUNTER — Encounter (HOSPITAL_BASED_OUTPATIENT_CLINIC_OR_DEPARTMENT_OTHER): Payer: Self-pay

## 2019-02-21 DIAGNOSIS — W19XXXA Unspecified fall, initial encounter: Secondary | ICD-10-CM | POA: Diagnosis present

## 2019-02-21 DIAGNOSIS — R4702 Dysphasia: Secondary | ICD-10-CM | POA: Diagnosis present

## 2019-02-21 DIAGNOSIS — I10 Essential (primary) hypertension: Secondary | ICD-10-CM | POA: Diagnosis present

## 2019-02-21 DIAGNOSIS — H510 Palsy (spasm) of conjugate gaze: Secondary | ICD-10-CM | POA: Diagnosis present

## 2019-02-21 DIAGNOSIS — I615 Nontraumatic intracerebral hemorrhage, intraventricular: Secondary | ICD-10-CM | POA: Diagnosis not present

## 2019-02-21 DIAGNOSIS — I611 Nontraumatic intracerebral hemorrhage in hemisphere, cortical: Principal | ICD-10-CM | POA: Diagnosis present

## 2019-02-21 DIAGNOSIS — Z1159 Encounter for screening for other viral diseases: Secondary | ICD-10-CM

## 2019-02-21 DIAGNOSIS — M479 Spondylosis, unspecified: Secondary | ICD-10-CM | POA: Diagnosis present

## 2019-02-21 DIAGNOSIS — R451 Restlessness and agitation: Secondary | ICD-10-CM | POA: Diagnosis not present

## 2019-02-21 DIAGNOSIS — H53462 Homonymous bilateral field defects, left side: Secondary | ICD-10-CM | POA: Diagnosis present

## 2019-02-21 DIAGNOSIS — M810 Age-related osteoporosis without current pathological fracture: Secondary | ICD-10-CM | POA: Diagnosis present

## 2019-02-21 DIAGNOSIS — I619 Nontraumatic intracerebral hemorrhage, unspecified: Secondary | ICD-10-CM | POA: Diagnosis not present

## 2019-02-21 DIAGNOSIS — S069X0S Unspecified intracranial injury without loss of consciousness, sequela: Secondary | ICD-10-CM | POA: Diagnosis not present

## 2019-02-21 DIAGNOSIS — Z7982 Long term (current) use of aspirin: Secondary | ICD-10-CM

## 2019-02-21 DIAGNOSIS — E785 Hyperlipidemia, unspecified: Secondary | ICD-10-CM | POA: Diagnosis present

## 2019-02-21 DIAGNOSIS — R339 Retention of urine, unspecified: Secondary | ICD-10-CM | POA: Diagnosis not present

## 2019-02-21 DIAGNOSIS — Z515 Encounter for palliative care: Secondary | ICD-10-CM | POA: Diagnosis not present

## 2019-02-21 DIAGNOSIS — Z888 Allergy status to other drugs, medicaments and biological substances status: Secondary | ICD-10-CM | POA: Diagnosis not present

## 2019-02-21 DIAGNOSIS — K5190 Ulcerative colitis, unspecified, without complications: Secondary | ICD-10-CM | POA: Diagnosis not present

## 2019-02-21 DIAGNOSIS — I361 Nonrheumatic tricuspid (valve) insufficiency: Secondary | ICD-10-CM | POA: Diagnosis not present

## 2019-02-21 DIAGNOSIS — N390 Urinary tract infection, site not specified: Secondary | ICD-10-CM | POA: Diagnosis not present

## 2019-02-21 DIAGNOSIS — H53469 Homonymous bilateral field defects, unspecified side: Secondary | ICD-10-CM

## 2019-02-21 DIAGNOSIS — G479 Sleep disorder, unspecified: Secondary | ICD-10-CM | POA: Diagnosis not present

## 2019-02-21 DIAGNOSIS — R0989 Other specified symptoms and signs involving the circulatory and respiratory systems: Secondary | ICD-10-CM | POA: Diagnosis not present

## 2019-02-21 DIAGNOSIS — G8194 Hemiplegia, unspecified affecting left nondominant side: Secondary | ICD-10-CM

## 2019-02-21 DIAGNOSIS — R269 Unspecified abnormalities of gait and mobility: Secondary | ICD-10-CM | POA: Diagnosis not present

## 2019-02-21 DIAGNOSIS — N319 Neuromuscular dysfunction of bladder, unspecified: Secondary | ICD-10-CM | POA: Diagnosis present

## 2019-02-21 DIAGNOSIS — R7303 Prediabetes: Secondary | ICD-10-CM | POA: Diagnosis present

## 2019-02-21 DIAGNOSIS — I69321 Dysphasia following cerebral infarction: Secondary | ICD-10-CM

## 2019-02-21 DIAGNOSIS — R4182 Altered mental status, unspecified: Secondary | ICD-10-CM | POA: Diagnosis not present

## 2019-02-21 DIAGNOSIS — Z79899 Other long term (current) drug therapy: Secondary | ICD-10-CM

## 2019-02-21 DIAGNOSIS — E871 Hypo-osmolality and hyponatremia: Secondary | ICD-10-CM | POA: Diagnosis not present

## 2019-02-21 DIAGNOSIS — Z22322 Carrier or suspected carrier of Methicillin resistant Staphylococcus aureus: Secondary | ICD-10-CM

## 2019-02-21 DIAGNOSIS — Z881 Allergy status to other antibiotic agents status: Secondary | ICD-10-CM | POA: Diagnosis not present

## 2019-02-21 DIAGNOSIS — R414 Neurologic neglect syndrome: Secondary | ICD-10-CM | POA: Diagnosis present

## 2019-02-21 DIAGNOSIS — W2201XA Walked into wall, initial encounter: Secondary | ICD-10-CM | POA: Diagnosis present

## 2019-02-21 DIAGNOSIS — I69398 Other sequelae of cerebral infarction: Secondary | ICD-10-CM

## 2019-02-21 DIAGNOSIS — A499 Bacterial infection, unspecified: Secondary | ICD-10-CM | POA: Diagnosis not present

## 2019-02-21 DIAGNOSIS — S06340A Traumatic hemorrhage of right cerebrum without loss of consciousness, initial encounter: Secondary | ICD-10-CM | POA: Diagnosis not present

## 2019-02-21 DIAGNOSIS — I612 Nontraumatic intracerebral hemorrhage in hemisphere, unspecified: Secondary | ICD-10-CM

## 2019-02-21 DIAGNOSIS — Z66 Do not resuscitate: Secondary | ICD-10-CM | POA: Diagnosis present

## 2019-02-21 DIAGNOSIS — Z882 Allergy status to sulfonamides status: Secondary | ICD-10-CM

## 2019-02-21 DIAGNOSIS — E876 Hypokalemia: Secondary | ICD-10-CM | POA: Diagnosis not present

## 2019-02-21 DIAGNOSIS — I69319 Unspecified symptoms and signs involving cognitive functions following cerebral infarction: Secondary | ICD-10-CM | POA: Diagnosis not present

## 2019-02-21 DIAGNOSIS — K5100 Ulcerative (chronic) pancolitis without complications: Secondary | ICD-10-CM | POA: Diagnosis present

## 2019-02-21 DIAGNOSIS — Z883 Allergy status to other anti-infective agents status: Secondary | ICD-10-CM | POA: Diagnosis not present

## 2019-02-21 DIAGNOSIS — I69154 Hemiplegia and hemiparesis following nontraumatic intracerebral hemorrhage affecting left non-dominant side: Secondary | ICD-10-CM | POA: Diagnosis present

## 2019-02-21 LAB — URINALYSIS, ROUTINE W REFLEX MICROSCOPIC
Bilirubin Urine: NEGATIVE
Glucose, UA: NEGATIVE mg/dL
Hgb urine dipstick: NEGATIVE
Ketones, ur: NEGATIVE mg/dL
Leukocytes,Ua: NEGATIVE
Nitrite: NEGATIVE
Protein, ur: NEGATIVE mg/dL
Specific Gravity, Urine: 1.01 (ref 1.005–1.030)
pH: 7 (ref 5.0–8.0)

## 2019-02-21 LAB — COMPREHENSIVE METABOLIC PANEL
ALT: 17 U/L (ref 0–44)
AST: 28 U/L (ref 15–41)
Albumin: 3.9 g/dL (ref 3.5–5.0)
Alkaline Phosphatase: 74 U/L (ref 38–126)
Anion gap: 8 (ref 5–15)
BUN: 13 mg/dL (ref 8–23)
CO2: 26 mmol/L (ref 22–32)
Calcium: 8.8 mg/dL — ABNORMAL LOW (ref 8.9–10.3)
Chloride: 103 mmol/L (ref 98–111)
Creatinine, Ser: 0.58 mg/dL (ref 0.44–1.00)
GFR calc Af Amer: >60 mL/min (ref >60)
GFR calc non Af Amer: >60 mL/min (ref >60)
Glucose, Bld: 97 mg/dL (ref 70–99)
Potassium: 4 mmol/L (ref 3.5–5.1)
Sodium: 137 mmol/L (ref 135–145)
Total Bilirubin: 0.6 mg/dL (ref 0.3–1.2)
Total Protein: 7.2 g/dL (ref 6.5–8.1)

## 2019-02-21 LAB — CBC WITH DIFFERENTIAL/PLATELET
Abs Immature Granulocytes: 0.03 10*3/uL (ref 0.00–0.07)
Basophils Absolute: 0.1 10*3/uL (ref 0.0–0.1)
Basophils Relative: 1 %
Eosinophils Absolute: 0.1 10*3/uL (ref 0.0–0.5)
Eosinophils Relative: 1 %
HCT: 37.6 % (ref 36.0–46.0)
Hemoglobin: 12.2 g/dL (ref 12.0–15.0)
Immature Granulocytes: 0 %
Lymphocytes Relative: 17 %
Lymphs Abs: 1.2 10*3/uL (ref 0.7–4.0)
MCH: 30 pg (ref 26.0–34.0)
MCHC: 32.4 g/dL (ref 30.0–36.0)
MCV: 92.6 fL (ref 80.0–100.0)
Monocytes Absolute: 0.6 10*3/uL (ref 0.1–1.0)
Monocytes Relative: 9 %
Neutro Abs: 4.8 10*3/uL (ref 1.7–7.7)
Neutrophils Relative %: 72 %
Platelets: 260 10*3/uL (ref 150–400)
RBC: 4.06 MIL/uL (ref 3.87–5.11)
RDW: 13.3 % (ref 11.5–15.5)
WBC: 6.8 10*3/uL (ref 4.0–10.5)
nRBC: 0 % (ref 0.0–0.2)

## 2019-02-21 LAB — CBG MONITORING, ED: Glucose-Capillary: 94 mg/dL (ref 70–99)

## 2019-02-21 LAB — SARS CORONAVIRUS 2 AG (30 MIN TAT): SARS Coronavirus 2 Ag: NEGATIVE

## 2019-02-21 LAB — GLUCOSE, CAPILLARY: Glucose-Capillary: 108 mg/dL — ABNORMAL HIGH (ref 70–99)

## 2019-02-21 MED ORDER — PANTOPRAZOLE SODIUM 40 MG IV SOLR
40.0000 mg | Freq: Every day | INTRAVENOUS | Status: DC
  Administered 2019-02-22 – 2019-02-23 (×2): 40 mg via INTRAVENOUS
  Filled 2019-02-21 (×2): qty 40

## 2019-02-21 MED ORDER — NORTRIPTYLINE HCL 10 MG PO CAPS
20.0000 mg | ORAL_CAPSULE | Freq: Every day | ORAL | Status: DC
  Administered 2019-02-22: 21:00:00 20 mg via ORAL
  Filled 2019-02-21 (×4): qty 2

## 2019-02-21 MED ORDER — AMLODIPINE BESYLATE 5 MG PO TABS
5.0000 mg | ORAL_TABLET | Freq: Every day | ORAL | Status: DC
  Administered 2019-02-22 – 2019-02-24 (×3): 5 mg via ORAL
  Filled 2019-02-21 (×3): qty 1

## 2019-02-21 MED ORDER — CIPROFLOXACIN HCL 100 MG PO TABS: 100.0000 mg | ORAL_TABLET | Freq: Two times a day (BID) | ORAL | Status: DC

## 2019-02-21 MED ORDER — ATENOLOL 100 MG PO TABS
100.0000 mg | ORAL_TABLET | Freq: Two times a day (BID) | ORAL | Status: DC
  Administered 2019-02-22 – 2019-02-24 (×5): 100 mg via ORAL
  Filled 2019-02-21 (×8): qty 1

## 2019-02-21 MED ORDER — BUSPIRONE HCL 10 MG PO TABS
5.0000 mg | ORAL_TABLET | Freq: Two times a day (BID) | ORAL | Status: DC
  Administered 2019-02-22 – 2019-02-23 (×3): 5 mg via ORAL
  Filled 2019-02-21 (×7): qty 1

## 2019-02-21 MED ORDER — NICARDIPINE HCL IN NACL 20-0.86 MG/200ML-% IV SOLN
0.0000 mg/h | INTRAVENOUS | Status: DC
  Administered 2019-02-21: 2.5 mg/h via INTRAVENOUS
  Filled 2019-02-21: qty 200

## 2019-02-21 MED ORDER — MESALAMINE 400 MG PO CPDR
800.0000 mg | DELAYED_RELEASE_CAPSULE | Freq: Three times a day (TID) | ORAL | Status: DC
  Administered 2019-02-22 – 2019-02-24 (×7): 800 mg via ORAL
  Filled 2019-02-21 (×11): qty 2

## 2019-02-21 MED ORDER — ONDANSETRON HCL 4 MG/2ML IJ SOLN
4.0000 mg | Freq: Once | INTRAMUSCULAR | Status: AC
  Administered 2019-02-21: 4 mg via INTRAVENOUS
  Filled 2019-02-21: qty 2

## 2019-02-21 MED ORDER — SENNOSIDES-DOCUSATE SODIUM 8.6-50 MG PO TABS
1.0000 | ORAL_TABLET | Freq: Two times a day (BID) | ORAL | Status: DC
  Administered 2019-02-22 – 2019-02-24 (×4): 1 via ORAL
  Filled 2019-02-21 (×6): qty 1

## 2019-02-21 MED ORDER — EZETIMIBE-SIMVASTATIN 10-20 MG PO TABS
1.0000 | ORAL_TABLET | Freq: Every day | ORAL | Status: DC
  Administered 2019-02-22: 1 via ORAL
  Filled 2019-02-21: qty 1

## 2019-02-21 MED ORDER — HYDROCORTISONE ACETATE 25 MG RE SUPP
25.0000 mg | Freq: Two times a day (BID) | RECTAL | Status: DC
  Administered 2019-02-22 – 2019-02-23 (×4): 25 mg via RECTAL
  Filled 2019-02-21 (×7): qty 1

## 2019-02-21 MED ORDER — IOHEXOL 350 MG/ML SOLN
100.0000 mL | Freq: Once | INTRAVENOUS | Status: AC | PRN
  Administered 2019-02-21: 18:00:00 100 mL via INTRAVENOUS

## 2019-02-21 MED ORDER — HYDROCHLOROTHIAZIDE 25 MG PO TABS
25.0000 mg | ORAL_TABLET | Freq: Every day | ORAL | Status: DC
  Administered 2019-02-22 – 2019-02-23 (×2): 25 mg via ORAL
  Filled 2019-02-21 (×3): qty 1

## 2019-02-21 MED ORDER — STROKE: EARLY STAGES OF RECOVERY BOOK
Freq: Once | Status: DC
  Filled 2019-02-21 (×2): qty 1

## 2019-02-21 MED ORDER — ACETAMINOPHEN 160 MG/5ML PO SOLN: 650.0000 mg | ORAL | Status: DC | PRN

## 2019-02-21 MED ORDER — ACETAMINOPHEN 650 MG RE SUPP: 650.0000 mg | RECTAL | Status: DC | PRN

## 2019-02-21 MED ORDER — ACETAMINOPHEN 325 MG PO TABS
650.0000 mg | ORAL_TABLET | ORAL | Status: DC | PRN
  Administered 2019-02-22: 650 mg via ORAL
  Filled 2019-02-21 (×3): qty 2

## 2019-02-21 MED ORDER — QUETIAPINE FUMARATE ER 50 MG PO TB24
50.0000 mg | ORAL_TABLET | Freq: Once | ORAL | Status: AC
  Administered 2019-02-21: 22:00:00 50 mg via ORAL
  Filled 2019-02-21: qty 1

## 2019-02-21 MED ORDER — METRONIDAZOLE 500 MG PO TABS: 500.0000 mg | ORAL_TABLET | Freq: Three times a day (TID) | ORAL | Status: DC

## 2019-02-21 MED ORDER — POTASSIUM CHLORIDE CRYS ER 20 MEQ PO TBCR
10.0000 meq | EXTENDED_RELEASE_TABLET | Freq: Two times a day (BID) | ORAL | Status: DC
  Administered 2019-02-22: 10 meq via ORAL
  Filled 2019-02-21 (×2): qty 1

## 2019-02-21 NOTE — ED Triage Notes (Signed)
Pt arrived to ED with son. Per son pt was fine yesterday while they were at a gathering. This AM when they checked on the pt she was confused and not acting appropriately. He believes the patient may have had a fall overnight. Pt currently answering questions appropriately but states she is having trouble with confusion. Pt denies fall.

## 2019-02-21 NOTE — ED Provider Notes (Signed)
[facility] EMERGENCY DEPARTMENT Provider Note   CSN: [id number] Arrival date & time: 02/21/19  1433    History   Chief Complaint Chief Complaint  Patient presents with   Altered Mental Status    HPI Renee Fritz is a 85 y.o. female.    HPI Family is visiting from out of town.  They have been with her since Thursday.   They had a party yesterday and everything seemed fine.  This am she seemed confused.  They thinks she had fallen.  She was having trouble making a cup of tea.  She would lose track of her tea.  Then she would look for the sugar and they would give it to her and she would go back to doing previous tasks like trying to find her cup or tea when she had already done that.   Items were also on  The floor and appeared disheveled.  She could not figure out how to leave her home.  Past Medical History:  Diagnosis Date   Hypertension    Osteoporosis    Other specified cardiac arrhythmias    Pneumonia    Ulcerative (chronic) enterocolitis [facility])     Patient Active Problem List   Diagnosis Date Noted   Cerebral hemorrhage (HCC) 02/21/2019   AKI (acute kidney injury) (HCC)    Diarrhea    Hypokalemia    Intractable vomiting with nausea    S/P hernia repair    Incarcerated hernia 07/03/2017   Incarcerated femoral hernia 07/03/2017    Past Surgical History:  Procedure Laterality Date   FEMORAL HERNIA REPAIR Left 07/03/2017   Procedure: HERNIA REPAIR FEMORAL;  Surgeon: Abigail Miyamoto, MD;  Location: [facility] OR;  Service: General;  Laterality: Left;   urinary ostomy       OB History   No obstetric history on file.      Home Medications    Prior to Admission medications   Medication Sig Start Date End Date Taking? Authorizing Provider  amLODipine (NORVASC) 5 MG tablet Take 5 mg by mouth daily.   Yes [provider]  aspirin 81 MG chewable tablet Chew by mouth daily.   Yes [provider]  atenolol (TENORMIN) 100 MG  tablet Take 100 mg by mouth 2 (two) times daily.    Yes [provider]  busPIRone (BUSPAR) 5 MG tablet Take 5 mg by mouth 2 (two) times daily.   Yes [provider]  cephALEXin (KEFLEX) 500 MG capsule Take 500 mg by mouth daily.   Yes [provider]  ezetimibe-simvastatin (VYTORIN) 10-20 MG tablet Take 1 tablet by mouth daily.   Yes [provider]  Mesalamine (ASACOL HD) 800 MG TBEC Take 800 mg by mouth 3 (three) times daily.   Yes [provider]  nortriptyline (PAMELOR) 10 MG capsule Take 20 mg by mouth at bedtime.   Yes [provider]  RABEprazole (ACIPHEX) 20 MG tablet Take 20 mg by mouth daily.    Yes [provider]  B-Complex-C-Biotin-Minerals-FA (FOLBEE PLUS CZ) 5 MG TABS Take 1 tablet by mouth daily.    [provider]  ciprofloxacin (CIPRO) 100 MG tablet Take 100 mg by mouth 2 (two) times daily.    [provider]  hydrochlorothiazide (HYDRODIURIL) 25 MG tablet Take 25 mg by mouth daily.    [provider]  hydrocortisone (ANUSOL-HC) 25 MG suppository Place 25 mg rectally 2 (two) times daily.    [provider]  metroNIDAZOLE (FLAGYL) 500 MG  tablet Take 500 mg by mouth 3 (three) times daily. 07/02/17   [provider]  ondansetron (ZOFRAN-ODT) 8 MG disintegrating tablet Take 8 mg by mouth every 8 (eight) hours as needed for nausea or vomiting.    [provider]  potassium chloride (K-DUR,KLOR-CON) 10 MEQ tablet Take 10 mEq by mouth 2 (two) times daily.    [provider]    Family History Family History  Family history unknown: Yes    Social History Social History   Tobacco Use   Smoking status: Never Smoker   Smokeless tobacco: Never Used  Substance Use Topics   Alcohol use: No   Drug use: No     Allergies   Phenergan [promethazine hcl], Vancomycin, Erythromycin-sulfisoxazole, and Sulfa antibiotics   Review of Systems Review of Systems   Constitutional: Negative for fever.  Respiratory: Negative for shortness of breath.   Cardiovascular: Negative for chest pain.  Gastrointestinal: Negative for diarrhea and vomiting.  All other systems reviewed and are negative.    Physical Exam Updated Vital Signs BP 139/73    Pulse (!) 102    Temp 98.9 F (37.2 C) (Oral)    Resp 18    Ht 1.524 m (5')    Wt 43.5 kg    SpO2 94%    BMI 18.75 kg/m   Physical Exam Vitals signs and nursing note reviewed.  Constitutional:      General: She is not in acute distress.    Appearance: She is well-developed.  HENT:     Head: Normocephalic.     Comments: Contusion left forehead    Right Ear: External ear normal.     Left Ear: External ear normal.     Nose: No rhinorrhea.  Eyes:     General: No scleral icterus.       Right eye: No discharge.        Left eye: No discharge.     Conjunctiva/sclera: Conjunctivae normal.  Neck:     Musculoskeletal: Neck supple.     Trachea: No tracheal deviation.  Cardiovascular:     Rate and Rhythm: Normal rate and regular rhythm.  Pulmonary:     Effort: Pulmonary effort is normal. No respiratory distress.     Breath sounds: Normal breath sounds. No stridor. No wheezing or rales.  Abdominal:     General: Bowel sounds are normal. There is no distension.     Palpations: Abdomen is soft.     Tenderness: There is no abdominal tenderness. There is no guarding or rebound.  Musculoskeletal:     Right shoulder: She exhibits no tenderness, no bony tenderness and no swelling.     Left shoulder: She exhibits no tenderness, no bony tenderness and no swelling.     Right wrist: She exhibits no tenderness, no bony tenderness and no swelling.     Left wrist: She exhibits no tenderness, no bony tenderness and no swelling.     Right hip: Normal. She exhibits normal range of motion, no tenderness, no bony tenderness and no swelling.     Left hip: Normal. She exhibits normal range of motion, no tenderness and no bony  tenderness.     Right ankle: She exhibits no swelling. No tenderness.     Left ankle: She exhibits no swelling. No tenderness.     Cervical back: She exhibits tenderness. She exhibits no bony tenderness and no swelling.     Thoracic back: She exhibits tenderness. She exhibits no bony tenderness and no swelling.  Lumbar back: She exhibits tenderness. She exhibits no bony tenderness and no swelling.  Skin:    General: Skin is warm and dry.     Findings: No rash.     Comments: Superficial skin tear left forearm, well approximate, no active bleeding, bruising noted around the area  Neurological:     Mental Status: She is alert.     Cranial Nerves: No cranial nerve deficit (no facial droop, extraocular movements intact, no slurred speech).     Sensory: No sensory deficit.     Motor: No abnormal muscle tone or seizure activity.     Coordination: Coordination normal.      ED Treatments / Results  Labs (all labs ordered are listed, but only abnormal results are displayed) Labs Reviewed  COMPREHENSIVE METABOLIC PANEL - Abnormal; Notable for the following components:      Result Value   Calcium 8.8 (*)    All other components within normal limits  URINALYSIS, ROUTINE W REFLEX MICROSCOPIC - Abnormal; Notable for the following components:   Color, Urine STRAW (*)    All other components within normal limits  SARS CORONAVIRUS 2 (HOSP ORDER, PERFORMED IN [place] LAB VIA ABBOTT ID)  CBC WITH DIFFERENTIAL/PLATELET  CBG MONITORING, ED    EKG EKG Interpretation  Date/Time:  Saturday February 21 2019 15:32:57 EDT Ventricular Rate:  89 PR Interval:    QRS Duration: 91 QT Interval:  380 QTC Calculation: 463 R Axis:   9 Text Interpretation:  Sinus rhythm Minimal ST elevation, anterior leads Baseline wander in lead(s) V3 V5 V6 Since last tracing rate slower Confirmed by Dorie Rank [phone]) on 02/21/2019 3:47:00 PM   Radiology Ct Angio Head W Or Wo Contrast  Result Date:  02/21/2019 CLINICAL DATA:  Cerebral hemorrhage. EXAM: CT ANGIOGRAPHY HEAD TECHNIQUE: Multidetector CT imaging of the head was performed using the standard protocol during bolus administration of intravenous contrast. Multiplanar CT image reconstructions and MIPs were obtained to evaluate the vascular anatomy. CONTRAST:  164mL OMNIPAQUE IOHEXOL 350 MG/ML SOLN COMPARISON:  None. FINDINGS: CTA HEAD Anterior circulation: No significant stenosis, proximal occlusion, aneurysm, or vascular malformation. Unchanged 3 x 4 cm RIGHT parietal hemorrhage. Posterior circulation: No significant stenosis, proximal occlusion, aneurysm, or vascular malformation. Venous sinuses: As permitted by contrast timing, patent. Anatomic variants: None of significance Delayed phase: Not performed. IMPRESSION: Unchanged 3 x 4 cm RIGHT parietal hemorrhage. This likely represents a lobar hypertensive bleed. There is no evidence of large vessel occlusion, saccular/mycotic aneurysm, visible venous thrombosis, or arteriovenous malformation contributing to the observed intracranial hemorrhage. Electronically Signed   By: Staci Righter M.D.   On: 02/21/2019 18:46   Dg Chest 1 View  Result Date: 02/21/2019 CLINICAL DATA:  Altered mental status EXAM: CHEST  1 VIEW COMPARISON:  06/07/2018 FINDINGS: No focal opacity or pleural effusion. Linear scarring in the right lower lung. Stable cardiomediastinal silhouette. No pneumothorax. Old bilateral rib fractures. Old fracture deformity right clavicle IMPRESSION: No active disease. Electronically Signed   By: Donavan Foil M.D.   On: 02/21/2019 18:57   Dg Thoracic Spine 2 View  Result Date: 02/21/2019 CLINICAL DATA:  Fall with back pain EXAM: THORACIC SPINE 2 VIEWS COMPARISON:  02/21/2019, 06/07/2018 FINDINGS: Chronic fracture deformity of the right mid clavicle. Old bilateral rib fractures. Kyphosis of the lower thoracic spine with treated compression deformities at T11 and T12. Mild compression  deformity T4. Remaining vertebral bodies demonstrate normal stature. IMPRESSION: 1. Osseous demineralization limits the exam 2. Chronic compression deformity T4.  Treated compression deformities T11-T12. No definite acute osseous abnormality Electronically Signed   By: Jasmine PangKim  Fujinaga M.D.   On: 02/21/2019 19:00   Dg Lumbar Spine Complete  Result Date: 02/21/2019 CLINICAL DATA:  Fall with back pain EXAM: LUMBAR SPINE - COMPLETE 4+ VIEW COMPARISON:  CT 09/04/2018 FINDINGS: Prior hernia repair. Clips in the right upper quadrant and pelvis. Excreted contrast within the renal collecting systems and urinary bladder. Lumbar alignment within normal limits. Treated compression fractures at T11 and T12. Lumbar vertebra demonstrate normal stature. Trace anterolisthesis L5 on S1, no change IMPRESSION: 1. No acute osseous abnormality 2. Excreted contrast within the renal collecting systems and bladder Electronically Signed   By: Jasmine PangKim  Fujinaga M.D.   On: 02/21/2019 19:03   Ct Head Wo Contrast  Result Date: 02/21/2019 CLINICAL DATA:  Confusion. Possible fall. EXAM: CT HEAD WITHOUT CONTRAST CT CERVICAL SPINE WITHOUT CONTRAST TECHNIQUE: Multidetector CT imaging of the head and cervical spine was performed following the standard protocol without intravenous contrast. Multiplanar CT image reconstructions of the cervical spine were also generated. COMPARISON:  02/14/2018. FINDINGS: CT HEAD FINDINGS Brain: There is an acute RIGHT parietal hemorrhage, 41 x 33 x 46 mm, mild surrounding edema, no RIGHT-to-LEFT shift or intraventricular extension. Slight subarachnoid blood. Elsewhere there is atrophy with chronic microvascular ischemic change. Vascular: Calcification of the cavernous internal carotid arteries consistent with cerebrovascular atherosclerotic disease. No signs of intracranial large vessel occlusion. Skull: No evidence of skull fracture. No significant scalp hematoma. Sinuses/Orbits: No acute findings. Other: None. CT  CERVICAL SPINE FINDINGS Alignment: Straightening of the normal cervical lordosis. 4 mm anterolisthesis C4 on C5. Skull base and vertebrae: No acute fracture. No primary bone lesion or focal pathologic process. Soft tissues and spinal canal: No prevertebral soft tissue swelling. Atherosclerosis. Disc levels:  Multilevel spondylosis, worst at C4-5 and C5-6. Upper chest: Negative. Other: None IMPRESSION: 1. Acute RIGHT parietal hemorrhage, 41 x 33 x 46 mm, with mild surrounding edema but no RIGHT-to-LEFT shift or intraventricular extension. Slight subarachnoid blood. Approximate volume 34 mL. Favor hypertensive etiology. Imaging features are atypical for posttraumatic hematoma, and there is no evidence of skull fracture or significant scalp hematoma. 2. No cervical spine fracture or traumatic subluxation. Multilevel spondylosis. 3. Critical Value/emergent results were called by telephone at the time of interpretation on 02/21/2019 at 4:35 pm to Dr. Linwood DibblesJON Colden Samaras , who verbally acknowledged these results. Electronically Signed   By: Elsie StainJohn T Curnes M.D.   On: 02/21/2019 16:40   Ct Cervical Spine Wo Contrast  Result Date: 02/21/2019 CLINICAL DATA:  Confusion. Possible fall. EXAM: CT HEAD WITHOUT CONTRAST CT CERVICAL SPINE WITHOUT CONTRAST TECHNIQUE: Multidetector CT imaging of the head and cervical spine was performed following the standard protocol without intravenous contrast. Multiplanar CT image reconstructions of the cervical spine were also generated. COMPARISON:  02/14/2018. FINDINGS: CT HEAD FINDINGS Brain: There is an acute RIGHT parietal hemorrhage, 41 x 33 x 46 mm, mild surrounding edema, no RIGHT-to-LEFT shift or intraventricular extension. Slight subarachnoid blood. Elsewhere there is atrophy with chronic microvascular ischemic change. Vascular: Calcification of the cavernous internal carotid arteries consistent with cerebrovascular atherosclerotic disease. No signs of intracranial large vessel occlusion.  Skull: No evidence of skull fracture. No significant scalp hematoma. Sinuses/Orbits: No acute findings. Other: None. CT CERVICAL SPINE FINDINGS Alignment: Straightening of the normal cervical lordosis. 4 mm anterolisthesis C4 on C5. Skull base and vertebrae: No acute fracture. No primary bone lesion or focal pathologic process. Soft tissues and spinal canal: No prevertebral soft  tissue swelling. Atherosclerosis. Disc levels:  Multilevel spondylosis, worst at C4-5 and C5-6. Upper chest: Negative. Other: None IMPRESSION: 1. Acute RIGHT parietal hemorrhage, 41 x 33 x 46 mm, with mild surrounding edema but no RIGHT-to-LEFT shift or intraventricular extension. Slight subarachnoid blood. Approximate volume 34 mL. Favor hypertensive etiology. Imaging features are atypical for posttraumatic hematoma, and there is no evidence of skull fracture or significant scalp hematoma. 2. No cervical spine fracture or traumatic subluxation. Multilevel spondylosis. 3. Critical Value/emergent results were called by telephone at the time of interpretation on 02/21/2019 at 4:35 pm to Dr. Linwood DibblesJON Dacotah Cabello , who verbally acknowledged these results. Electronically Signed   By: Elsie StainJohn T Curnes M.D.   On: 02/21/2019 16:40    Procedures .Critical Care Performed by: Linwood DibblesKnapp, Ashwika Freels, MD Authorized by: Linwood DibblesKnapp, Dayne Chait, MD   Critical care provider statement:    Critical care time (minutes):  35   Critical care was time spent personally by me on the following activities:  Discussions with consultants, evaluation of patient's response to treatment, examination of patient, ordering and performing treatments and interventions, ordering and review of laboratory studies, ordering and review of radiographic studies, pulse oximetry, re-evaluation of patient's condition, obtaining history from patient or surrogate and review of old charts   (including critical care time)  Medications Ordered in ED Medications  nicardipine (CARDENE) 20mg  in 0.86% saline 200ml IV  infusion (0.1 mg/ml) (2.5 mg/hr Intravenous New Bag/Given 02/21/19 1650)  iohexol (OMNIPAQUE) 350 MG/ML injection 100 mL (100 mLs Intravenous Contrast Given 02/21/19 1753)     Initial Impression / Assessment and Plan / ED Course  I have reviewed the triage vital signs and the nursing notes.  Pertinent labs & imaging results that were available during my care of the patient were reviewed by me and considered in my medical decision making (see chart for details).  Clinical Course as of Feb 21 1915  Sat Feb 21, 2019  1631 Prelim CT reading by me.  Right sided intracerebral hemorrhage.  No sig midline shift.  Formal reading pending.   [JK]  1639 BP elevated.  Will start Cardene infusion.      [JK]  1644 Discuss findings with family.  They are going to consult with other family members regarding her CODE STATUS   [JK]  1702 Spo2 of 80@ is not accurate.  Sats have been normal at the bedside    [JK]  1716 D/w Dr Wilford CornerArora.  Would like a CT angio to characterize further to determine if she will need neurosurgical intervention   [JK]  1725 Confirmed with family.  Only on a baby ASA.  Pt wishes to be full code   [JK]  1830 D/w Dr Wilford CornerArora who reviewed the CT angio.  No sign of aneurysm, active bleeding.  [facility]   [JK]    Clinical Course User Index [JK] Linwood DibblesKnapp, Marionna Gonia, MD     Patient presented to the emergency room for evaluation of confusion.  Patient did have evidence of a contusion to her left forehead.  Patient's ED work-up is notable for an abnormal head CT showing a cerebral hemorrhage.  Lab tests are otherwise unremarkable.  Patient's neurologic exam has remained stable.  She is alert, confused but answering questions otherwise appropriately.  She is protecting her airway without difficulty.  Cardene drip was started for managing her blood pressure.  [facility].  Family was at the bedside during  her stay in the ED.  They were updated on the findings and plan . Final Clinical Impressions(s) / ED Diagnoses   Final diagnoses:  Nontraumatic hemorrhage of right cerebral hemisphere [facility])      Linwood Dibbles, MD 02/21/19 1610

## 2019-02-21 NOTE — H&P (Addendum)
Admission H&P    Chief Complaint: Acute right parietofrontal ICH  HPI: Renee Fritz is an 85 y.o. female presenting in transfer from [facility] with an acute right parietofrontal hemorrhage. She presented to the [facility] ED in the afternoon today with her son. She was LKN yesterday. She was confused and not acting appropriately this AM when family checked on her. For example, she had trouble making a cup of tea, forgetting items and leaving items on the floor, appeared disheveled and was unable to figure out how to exit the home. The son thought that maybe the patient had fallen overnight. The patient later stated that she had stumbled this morning and ran into the wall with her left forehead - a small raised bruise was noted there by ED staff. She was able to answer questions in Triage at [facility]. CT head revealed a 34 mL right parietofrontal acute ICH. She is on ASA at home but does not take a blood thinner. She has HTN. She was started on nicardipine gtt at the OSH, then transported to [facility] for further management.   CTA of head: There is no evidence of large vessel occlusion, saccular/mycotic aneurysm, visible venous thrombosis, or arteriovenous malformation contributing to the observed intracranial hemorrhage.  CT head: 1. Acute RIGHT parietal hemorrhage, 41 x 33 x 46 mm, with mild surrounding edema but no RIGHT-to-LEFT shift or intraventricular extension. Slight subarachnoid blood. Approximate volume 34 mL. Favor hypertensive etiology. Imaging features are atypical for posttraumatic hematoma, and there is no evidence of skull fracture or significant scalp hematoma. 2. No cervical spine fracture or traumatic subluxation. Multilevel spondylosis.  EKG: Sinus rhythm Minimal ST elevation, anterior leads Baseline wander in lead(s) V3 V5 V6 Since last tracing rate slower  Past Medical History:  Diagnosis Date  . Hypertension   . Osteoporosis   . Other specified cardiac arrhythmias    . Pneumonia   . Ulcerative (chronic) enterocolitis (HCC)     Past Surgical History:  Procedure Laterality Date  . FEMORAL HERNIA REPAIR Left 07/03/2017   Procedure: HERNIA REPAIR FEMORAL;  Surgeon: Abigail Miyamoto, MD;  Location: [facility] OR;  Service: General;  Laterality: Left;  . urinary ostomy      Family History  Family history unknown: Yes   Social History:  reports that she has never smoked. She has never used smokeless tobacco. She reports that she does not drink alcohol or use drugs.  Allergies:  Allergies  Allergen Reactions  . Phenergan [Promethazine Hcl] Other (See Comments)    Feels like bugs crawling all over  . Vancomycin Diarrhea and Other (See Comments)    Stomach pains  . Erythromycin-Sulfisoxazole Rash  . Sulfa Antibiotics Rash    Medications Prior to Admission  Medication Sig Dispense Refill  . amLODipine (NORVASC) 5 MG tablet Take 5 mg by mouth daily.    Marland Kitchen aspirin 81 MG chewable tablet Chew by mouth daily.    Marland Kitchen atenolol (TENORMIN) 100 MG tablet Take 100 mg by mouth 2 (two) times daily.     . busPIRone (BUSPAR) 5 MG tablet Take 5 mg by mouth 2 (two) times daily.    . cephALEXin (KEFLEX) 500 MG capsule Take 500 mg by mouth daily.    Marland Kitchen ezetimibe-simvastatin (VYTORIN) 10-20 MG tablet Take 1 tablet by mouth daily.    . Mesalamine (ASACOL HD) 800 MG TBEC Take 800 mg by mouth 3 (three) times daily.    . nortriptyline (PAMELOR) 10 MG capsule Take 20 mg by mouth at  bedtime.    . RABEprazole (ACIPHEX) 20 MG tablet Take 20 mg by mouth daily.     Marland Kitchen B-Complex-C-Biotin-Minerals-FA (FOLBEE PLUS CZ) 5 MG TABS Take 1 tablet by mouth daily.    . ciprofloxacin (CIPRO) 100 MG tablet Take 100 mg by mouth 2 (two) times daily.    . hydrochlorothiazide (HYDRODIURIL) 25 MG tablet Take 25 mg by mouth daily.    . hydrocortisone (ANUSOL-HC) 25 MG suppository Place 25 mg rectally 2 (two) times daily.    . metroNIDAZOLE (FLAGYL) 500 MG tablet Take 500 mg by mouth 3 (three) times daily.     . ondansetron (ZOFRAN-ODT) 8 MG disintegrating tablet Take 8 mg by mouth every 8 (eight) hours as needed for nausea or vomiting.    . potassium chloride (K-DUR,KLOR-CON) 10 MEQ tablet Take 10 mEq by mouth 2 (two) times daily.      ROS: The patient is unable to provide a ROS due to confusion.   Physical Examination: Blood pressure (!) 120/59, pulse (!) 123, temperature 98.9 F (37.2 C), temperature source Oral, resp. rate (!) 24, height 5' (1.524 m), weight 41.8 kg, SpO2 96 %.  HEENT-  Normocephalic. Small bruise to left forehead is noted. Neck supple. Cardiovascular - RRR Lungs - Respirations unlabored. Normal breath sounds.  Abdomen - Nondistended Extremities - Warm and well perfused  Neurologic Examination: Mental Status: Awake and alert. Decreased attention. Oriented to self, city, state, day, year and month. Left hemineglect noted. Anosognosic regarding left sided deficits. Speech fluent with intact comprehension. Has some difficulty with naming, pausing and remaining silent when asked to identify 2 objects, versus noncompliant with this portion of exam. Cranial Nerves: II:  Left visual field cut. PERRL.  III,IV, VI: Right sided gaze preference. Will track to left past midline but will not bury sclerae and gaze quickly shifts back to her right side. No nystagmus. No ptosis.  V,VII: Does not react consistently to left facial stimulation. Mild left facial droop.  VIII: HOH IX,X: No hypophonia XI: Head tends to preferentially rotate to right.  XII: midline tongue extension  Motor: RUE and RLE 4+/5 LUE 4/5 LLE 4+/5 Sensory: Decreased responsiveness to left sided stimuli.  Deep Tendon Reflexes:  2+ bilateral upper extremities. Hypoactive patellae bilaterally.  Cerebellar: No ataxia with FNF bilaterally. Slow on the left.  Gait: Deferred  Results for orders placed or performed during the hospital encounter of 02/21/19 (from the past 48 hour(s))  Urinalysis, Routine w reflex  microscopic     Status: Abnormal   Collection Time: 02/21/19  3:30 PM  Result Value Ref Range   Color, Urine STRAW (A) YELLOW   APPearance CLEAR CLEAR   Specific Gravity, Urine 1.010 1.005 - 1.030   pH 7.0 5.0 - 8.0   Glucose, UA NEGATIVE NEGATIVE mg/dL   Hgb urine dipstick NEGATIVE NEGATIVE   Bilirubin Urine NEGATIVE NEGATIVE   Ketones, ur NEGATIVE NEGATIVE mg/dL   Protein, ur NEGATIVE NEGATIVE mg/dL   Nitrite NEGATIVE NEGATIVE   Leukocytes,Ua NEGATIVE NEGATIVE    Comment: Microscopic not done on urines with negative protein, blood, leukocytes, nitrite, or glucose < 500 mg/dL. Performed at [facility], [address] [place], [place] [address]   Comprehensive metabolic panel     Status: Abnormal   Collection Time: 02/21/19  3:33 PM  Result Value Ref Range   Sodium 137 135 - 145 mmol/L   Potassium 4.0 3.5 - 5.1 mmol/L   Chloride 103 98 - 111 mmol/L  CO2 26 22 - 32 mmol/L   Glucose, Bld 97 70 - 99 mg/dL   BUN 13 8 - 23 mg/dL   Creatinine, Ser 4.090.58 0.44 - 1.00 mg/dL   Calcium 8.8 (L) 8.9 - 10.3 mg/dL   Total Protein 7.2 6.5 - 8.1 g/dL   Albumin 3.9 3.5 - 5.0 g/dL   AST 28 15 - 41 U/L   ALT 17 0 - 44 U/L   Alkaline Phosphatase 74 38 - 126 U/L   Total Bilirubin 0.6 0.3 - 1.2 mg/dL   GFR calc non Af Amer >60 >60 mL/min   GFR calc Af Amer >60 >60 mL/min   Anion gap 8 5 - 15    Comment: Performed at [facility], [address] [place], [place] [address]  CBC WITH DIFFERENTIAL     Status: None   Collection Time: 02/21/19  3:33 PM  Result Value Ref Range   WBC 6.8 4.0 - 10.5 K/uL   RBC 4.06 3.87 - 5.11 MIL/uL   Hemoglobin 12.2 12.0 - 15.0 g/dL   HCT 47.837.6 29.536.0 - 62.146.0 %   MCV 92.6 80.0 - 100.0 fL   MCH 30.0 26.0 - 34.0 pg   MCHC 32.4 30.0 - 36.0 g/dL   RDW 30.813.3 65.711.5 - 84.615.5 %   Platelets 260 150 - 400 K/uL   nRBC 0.0 0.0 - 0.2 %   Neutrophils Relative % 72 %   Neutro Abs 4.8 1.7 - 7.7 K/uL   Lymphocytes Relative 17 %   Lymphs Abs 1.2 0.7 - 4.0 K/uL    Monocytes Relative 9 %   Monocytes Absolute 0.6 0.1 - 1.0 K/uL   Eosinophils Relative 1 %   Eosinophils Absolute 0.1 0.0 - 0.5 K/uL   Basophils Relative 1 %   Basophils Absolute 0.1 0.0 - 0.1 K/uL   Immature Granulocytes 0 %   Abs Immature Granulocytes 0.03 0.00 - 0.07 K/uL    Comment: Performed at [facility], [address] [place], [place] [address]  CBG monitoring, ED     Status: None   Collection Time: 02/21/19  3:34 PM  Result Value Ref Range   Glucose-Capillary 94 70 - 99 mg/dL  SARS Coronavirus 2 (Hosp order,Performed in [facility] lab via [employer] ID)     Status: None   Collection Time: 02/21/19  5:12 PM   Specimen: Dry Nasal Swab ([employer] ID Now)  Result Value Ref Range   SARS Coronavirus 2 ([employer] ID Now) NEGATIVE NEGATIVE    Comment: (NOTE) Interpretive Result Comment(s): COVID 19 Positive SARS CoV 2 target nucleic acids are DETECTED. The SARS CoV 2 RNA is generally detectable in upper and lower respiratory specimens during the acute phase of infection.  Positive results are indicative of active infection with SARS CoV 2.  Clinical correlation with patient history and other diagnostic information is necessary to determine patient infection status.  Positive results do not rule out bacterial infection or coinfection with other viruses. The expected result is Negative. COVID 19 Negative SARS CoV 2 target nucleic acids are NOT DETECTED. The SARS CoV 2 RNA is generally detectable in upper and lower respiratory specimens during the acute phase of infection.  Negative results do not preclude SARS CoV 2 infection, do not rule out coinfections with other pathogens, and should not be used as the sole basis for treatment or other patient management decisions.  Negative results must be combined with clinical  observations, patient history, and epidemiological  information. The expected result is Negative. Invalid Presence or absence of SARS CoV 2 nucleic acids  cannot be determined. Repeat testing was performed on the submitted specimen and repeated Invalid results were obtained.  If clinically indicated, additional testing on a new specimen with an alternate test methodology [phone]([id number]) is advised.  The SARS CoV 2 RNA is generally detectable in upper and lower respiratory specimens during the acute phase of infection. The expected result is Negative. Fact Sheet for Patients:  http://www.graves-ford.org/https://www.fda.gov/media/136524/download Fact Sheet for Healthcare Providers: EnviroConcern.sihttps://www.fda.gov/media/136523/download This test is not yet approved or cleared by the [place] FDA and has been authorized for detection and/or diagnosis of SARS CoV 2 by FDA under an Emergency Use Authorization (EUA).  This EUA will remain in effect (meaning this test can be used) for the duration of the COVID19 d eclaration under Section 564(b)(1) of the Act, 21 U.S.C. section [phone] 3(b)(1), unless the authorization is terminated or revoked sooner. Performed at [facility], [address] [place], [place] [address]   Glucose, capillary     Status: Abnormal   Collection Time: 02/21/19  9:03 PM  Result Value Ref Range   Glucose-Capillary 108 (H) 70 - 99 mg/dL   Ct Angio Head W Or Wo Contrast  Result Date: 02/21/2019 CLINICAL DATA:  Cerebral hemorrhage. EXAM: CT ANGIOGRAPHY HEAD TECHNIQUE: Multidetector CT imaging of the head was performed using the standard protocol during bolus administration of intravenous contrast. Multiplanar CT image reconstructions and MIPs were obtained to evaluate the vascular anatomy. CONTRAST:  100mL OMNIPAQUE IOHEXOL 350 MG/ML SOLN COMPARISON:  None. FINDINGS: CTA HEAD Anterior circulation: No significant stenosis, proximal occlusion, aneurysm, or vascular malformation. Unchanged 3 x 4 cm RIGHT parietal hemorrhage. Posterior circulation: No significant stenosis, proximal occlusion, aneurysm, or vascular malformation. Venous sinuses: As permitted  by contrast timing, patent. Anatomic variants: None of significance Delayed phase: Not performed. IMPRESSION: Unchanged 3 x 4 cm RIGHT parietal hemorrhage. This likely represents a lobar hypertensive bleed. There is no evidence of large vessel occlusion, saccular/mycotic aneurysm, visible venous thrombosis, or arteriovenous malformation contributing to the observed intracranial hemorrhage. Electronically Signed   By: Elsie StainJohn T Curnes M.D.   On: 02/21/2019 18:46   Dg Chest 1 View  Result Date: 02/21/2019 CLINICAL DATA:  Altered mental status EXAM: CHEST  1 VIEW COMPARISON:  06/07/2018 FINDINGS: No focal opacity or pleural effusion. Linear scarring in the right lower lung. Stable cardiomediastinal silhouette. No pneumothorax. Old bilateral rib fractures. Old fracture deformity right clavicle IMPRESSION: No active disease. Electronically Signed   By: Jasmine PangKim  Fujinaga M.D.   On: 02/21/2019 18:57   Dg Thoracic Spine 2 View  Result Date: 02/21/2019 CLINICAL DATA:  Fall with back pain EXAM: THORACIC SPINE 2 VIEWS COMPARISON:  02/21/2019, 06/07/2018 FINDINGS: Chronic fracture deformity of the right mid clavicle. Old bilateral rib fractures. Kyphosis of the lower thoracic spine with treated compression deformities at T11 and T12. Mild compression deformity T4. Remaining vertebral bodies demonstrate normal stature. IMPRESSION: 1. Osseous demineralization limits the exam 2. Chronic compression deformity T4. Treated compression deformities T11-T12. No definite acute osseous abnormality Electronically Signed   By: Jasmine PangKim  Fujinaga M.D.   On: 02/21/2019 19:00   Dg Lumbar Spine Complete  Result Date: 02/21/2019 CLINICAL DATA:  Fall with back pain EXAM: LUMBAR SPINE - COMPLETE 4+ VIEW COMPARISON:  CT 09/04/2018 FINDINGS: Prior hernia repair. Clips in the right upper quadrant and pelvis. Excreted contrast within the renal collecting systems and urinary bladder. Lumbar alignment within normal  limits. Treated compression fractures  at T11 and T12. Lumbar vertebra demonstrate normal stature. Trace anterolisthesis L5 on S1, no change IMPRESSION: 1. No acute osseous abnormality 2. Excreted contrast within the renal collecting systems and bladder Electronically Signed   By: Jasmine PangKim  Fujinaga M.D.   On: 02/21/2019 19:03   Ct Head Wo Contrast  Result Date: 02/21/2019 CLINICAL DATA:  Confusion. Possible fall. EXAM: CT HEAD WITHOUT CONTRAST CT CERVICAL SPINE WITHOUT CONTRAST TECHNIQUE: Multidetector CT imaging of the head and cervical spine was performed following the standard protocol without intravenous contrast. Multiplanar CT image reconstructions of the cervical spine were also generated. COMPARISON:  02/14/2018. FINDINGS: CT HEAD FINDINGS Brain: There is an acute RIGHT parietal hemorrhage, 41 x 33 x 46 mm, mild surrounding edema, no RIGHT-to-LEFT shift or intraventricular extension. Slight subarachnoid blood. Elsewhere there is atrophy with chronic microvascular ischemic change. Vascular: Calcification of the cavernous internal carotid arteries consistent with cerebrovascular atherosclerotic disease. No signs of intracranial large vessel occlusion. Skull: No evidence of skull fracture. No significant scalp hematoma. Sinuses/Orbits: No acute findings. Other: None. CT CERVICAL SPINE FINDINGS Alignment: Straightening of the normal cervical lordosis. 4 mm anterolisthesis C4 on C5. Skull base and vertebrae: No acute fracture. No primary bone lesion or focal pathologic process. Soft tissues and spinal canal: No prevertebral soft tissue swelling. Atherosclerosis. Disc levels:  Multilevel spondylosis, worst at C4-5 and C5-6. Upper chest: Negative. Other: None IMPRESSION: 1. Acute RIGHT parietal hemorrhage, 41 x 33 x 46 mm, with mild surrounding edema but no RIGHT-to-LEFT shift or intraventricular extension. Slight subarachnoid blood. Approximate volume 34 mL. Favor hypertensive etiology. Imaging features are atypical for posttraumatic hematoma, and  there is no evidence of skull fracture or significant scalp hematoma. 2. No cervical spine fracture or traumatic subluxation. Multilevel spondylosis. 3. Critical Value/emergent results were called by telephone at the time of interpretation on 02/21/2019 at 4:35 pm to Dr. Linwood DibblesJON KNAPP , who verbally acknowledged these results. Electronically Signed   By: Elsie StainJohn T Curnes M.D.   On: 02/21/2019 16:40   Ct Cervical Spine Wo Contrast  Result Date: 02/21/2019 CLINICAL DATA:  Confusion. Possible fall. EXAM: CT HEAD WITHOUT CONTRAST CT CERVICAL SPINE WITHOUT CONTRAST TECHNIQUE: Multidetector CT imaging of the head and cervical spine was performed following the standard protocol without intravenous contrast. Multiplanar CT image reconstructions of the cervical spine were also generated. COMPARISON:  02/14/2018. FINDINGS: CT HEAD FINDINGS Brain: There is an acute RIGHT parietal hemorrhage, 41 x 33 x 46 mm, mild surrounding edema, no RIGHT-to-LEFT shift or intraventricular extension. Slight subarachnoid blood. Elsewhere there is atrophy with chronic microvascular ischemic change. Vascular: Calcification of the cavernous internal carotid arteries consistent with cerebrovascular atherosclerotic disease. No signs of intracranial large vessel occlusion. Skull: No evidence of skull fracture. No significant scalp hematoma. Sinuses/Orbits: No acute findings. Other: None. CT CERVICAL SPINE FINDINGS Alignment: Straightening of the normal cervical lordosis. 4 mm anterolisthesis C4 on C5. Skull base and vertebrae: No acute fracture. No primary bone lesion or focal pathologic process. Soft tissues and spinal canal: No prevertebral soft tissue swelling. Atherosclerosis. Disc levels:  Multilevel spondylosis, worst at C4-5 and C5-6. Upper chest: Negative. Other: None IMPRESSION: 1. Acute RIGHT parietal hemorrhage, 41 x 33 x 46 mm, with mild surrounding edema but no RIGHT-to-LEFT shift or intraventricular extension. Slight subarachnoid blood.  Approximate volume 34 mL. Favor hypertensive etiology. Imaging features are atypical for posttraumatic hematoma, and there is no evidence of skull fracture or significant scalp hematoma. 2. No cervical spine fracture or  traumatic subluxation. Multilevel spondylosis. 3. Critical Value/emergent results were called by telephone at the time of interpretation on 02/21/2019 at 4:35 pm to Dr. Dorie Rank , who verbally acknowledged these results. Electronically Signed   By: Staci Righter M.D.   On: 02/21/2019 16:40   Assessment: 85 year old female with acute right parietofrontal ICH 1. Exam reveals left hemineglect, mild left sided weakness and left visual field cut.  2. CT head reveals an acute RIGHT parietal hemorrhage, 41 x 33 x 46 mm, with mild surrounding edema but no RIGHT-to-LEFT shift or intraventricular extension. Slight subarachnoid blood. Approximate volume 34 mL. 3. On CTA head, there is no evidence of large vessel occlusion, saccular/mycotic aneurysm, visible venous thrombosis, or arteriovenous malformation contributing to the observed intracranial hemorrhage 4. History of ulcerative colitis.    Plan: 1. Admitted to the ICU under the Neurology service 2. MRI of head 3. Carotid ultrasound 4. TTE 5. PT consult, OT consult, Speech consult 6. Cardiac telemetry 7. Frequent neuro checks 8. No antiplatelet medications or anticoagulants. DVT prophylaxis with SCDs. Home ASA has been discontinued.  9. BP management with nicardipine drip  10. Repeat CT head at 4:30 PM on Sunday 11. Continue home dosage regimens of mesalamine, ciprofloxacin and metronidazole for ulcerative colitis. Also continuing Anusol ointment.   60 minutes spent in the evaluation and management of this critically ill patient.   Electronically signed: Dr. Kerney Elbe 02/21/2019, 9:59 PM

## 2019-02-21 NOTE — ED Notes (Signed)
Report given to [facility] at this time.  

## 2019-02-21 NOTE — ED Notes (Addendum)
ED TO INPATIENT HANDOFF REPORT  ED Nurse Name and Phone #: Chauncey MannSusanna   S Name/Age/Gender Renee Fritz 85 y.o. female Room/Bed: MH11/MH11  Code Status   Code Status: Prior  Home/SNF/Other Home Patient oriented to: self, place, time and situation Is this baseline? Yes   Triage Complete: Triage complete  Chief Complaint Altered Mental State; Fall  Triage Note Pt arrived to ED with son. Per son pt was fine yesterday while they were at a gathering. This AM when they checked on the pt she was confused and not acting appropriately. He believes the patient may have had a fall overnight. Pt currently answering questions appropriately but states she is having trouble with confusion. Pt denies fall.    Allergies Allergies  Allergen Reactions  . Phenergan [Promethazine Hcl] Other (See Comments)    Feels like bugs crawling all over  . Vancomycin Diarrhea and Other (See Comments)    Stomach pains  . Erythromycin-Sulfisoxazole Rash  . Sulfa Antibiotics Rash    Level of Care/Admitting Diagnosis ED Disposition    ED Disposition Condition Comment   Admit  [facility] [[id number]]  Level of Care: ICU [6]  Covid Evaluation: Person Under Investigation (PUI)  Isolation Risk Level: Low Risk/Droplet (Less than 4L [place] supplementation)  Diagnosis: Cerebral hemorrhage [facility](HCC) [[id number]]) [[id number]]  Admitting Physician: Milon DikesARORA, ASHISH [[id number]]  Attending Physician: Milon DikesARORA, ASHISH [[id number]]  Estimated length of stay: past midnight tomorrow  Certification:: I certify this patient will need inpatient services for at least 2 midnights  PT Class (Do Not Modify): Inpatient [101]  PT Acc Code (Do Not Modify): Private [1]       B Medical/Surgery History Past Medical History:  Diagnosis Date  . Hypertension   . Osteoporosis   . Other specified cardiac arrhythmias   . Pneumonia   . Ulcerative (chronic) enterocolitis (HCC)    Past Surgical History:  Procedure Laterality Date  .  FEMORAL HERNIA REPAIR Left 07/03/2017   Procedure: HERNIA REPAIR FEMORAL;  Surgeon: Abigail MiyamotoBlackman, Douglas, MD;  [facility];  Service: General;  Laterality: Left;  . urinary ostomy       A IV Location/Drains/Wounds Patient Lines/Drains/Airways Status   Active Line/Drains/Airways    Name:   Placement date:   Placement time:   Site:   Days:   Peripheral IV 02/21/19 Right Antecubital   02/21/19    1532    Antecubital   less than 1   Peripheral IV 02/21/19 Right Forearm   02/21/19    1716    Forearm   less than 1   Incision (Closed) 07/03/17 Groin Left   07/03/17    1646     598          Intake/Output Last 24 hours  Intake/Output Summary (Last 24 hours) at 02/21/2019 1928 Last data filed at 02/21/2019 1914 Gross per 24 hour  Intake -  Output 1275 ml  Net -1275 ml    Labs/Imaging Results for orders placed or performed during the hospital encounter of 02/21/19 (from the past 48 hour(s))  Urinalysis, Routine w reflex microscopic     Status: Abnormal   Collection Time: 02/21/19  3:30 PM  Result Value Ref Range   Color, Urine STRAW (A) YELLOW   APPearance CLEAR CLEAR   Specific Gravity, Urine 1.010 1.005 - 1.030   pH 7.0 5.0 - 8.0   Glucose, UA NEGATIVE NEGATIVE mg/dL   Hgb urine dipstick NEGATIVE NEGATIVE   Bilirubin Urine NEGATIVE NEGATIVE   Ketones,  ur NEGATIVE NEGATIVE mg/dL   Protein, ur NEGATIVE NEGATIVE mg/dL   Nitrite NEGATIVE NEGATIVE   Leukocytes,Ua NEGATIVE NEGATIVE    Comment: Microscopic not done on urines with negative protein, blood, leukocytes, nitrite, or glucose < 500 mg/dL. Performed at [facility], [address] [place], [place] [address]   Comprehensive metabolic panel     Status: Abnormal   Collection Time: 02/21/19  3:33 PM  Result Value Ref Range   Sodium 137 135 - 145 mmol/L   Potassium 4.0 3.5 - 5.1 mmol/L   Chloride 103 98 - 111 mmol/L   CO2 26 22 - 32 mmol/L   Glucose, Bld 97 70 - 99 mg/dL   BUN 13 8 - 23 mg/dL   Creatinine, Ser  6.04 0.44 - 1.00 mg/dL   Calcium 8.8 (L) 8.9 - 10.3 mg/dL   Total Protein 7.2 6.5 - 8.1 g/dL   Albumin 3.9 3.5 - 5.0 g/dL   AST 28 15 - 41 U/L   ALT 17 0 - 44 U/L   Alkaline Phosphatase 74 38 - 126 U/L   Total Bilirubin 0.6 0.3 - 1.2 mg/dL   GFR calc non Af Amer >60 >60 mL/min   GFR calc Af Amer >60 >60 mL/min   Anion gap 8 5 - 15    Comment: Performed at [facility], [address] [place], [place] [address]  CBC WITH DIFFERENTIAL     Status: None   Collection Time: 02/21/19  3:33 PM  Result Value Ref Range   WBC 6.8 4.0 - 10.5 K/uL   RBC 4.06 3.87 - 5.11 MIL/uL   Hemoglobin 12.2 12.0 - 15.0 g/dL   HCT 11.9 14.7 - 82.9 %   MCV 92.6 80.0 - 100.0 fL   MCH 30.0 26.0 - 34.0 pg   MCHC 32.4 30.0 - 36.0 g/dL   RDW 56.2 13.0 - 86.5 %   Platelets 260 150 - 400 K/uL   nRBC 0.0 0.0 - 0.2 %   Neutrophils Relative % 72 %   Neutro Abs 4.8 1.7 - 7.7 K/uL   Lymphocytes Relative 17 %   Lymphs Abs 1.2 0.7 - 4.0 K/uL   Monocytes Relative 9 %   Monocytes Absolute 0.6 0.1 - 1.0 K/uL   Eosinophils Relative 1 %   Eosinophils Absolute 0.1 0.0 - 0.5 K/uL   Basophils Relative 1 %   Basophils Absolute 0.1 0.0 - 0.1 K/uL   Immature Granulocytes 0 %   Abs Immature Granulocytes 0.03 0.00 - 0.07 K/uL    Comment: Performed at [facility], [address] [place], [place] [address]  CBG monitoring, ED     Status: None   Collection Time: 02/21/19  3:34 PM  Result Value Ref Range   Glucose-Capillary 94 70 - 99 mg/dL  SARS Coronavirus 2 (Hosp order,Performed in [facility] lab via [employer] ID)     Status: None   Collection Time: 02/21/19  5:12 PM   Specimen: Dry Nasal Swab ([employer] ID Now)  Result Value Ref Range   SARS Coronavirus 2 ([employer] ID Now) NEGATIVE NEGATIVE    Comment: (NOTE) Interpretive Result Comment(s): COVID 19 Positive SARS CoV 2 target nucleic acids are DETECTED. The SARS CoV 2 RNA is generally detectable in upper and lower respiratory specimens during the  acute phase of infection.  Positive results are indicative of active infection with SARS CoV 2.  Clinical correlation with patient history and other diagnostic information  is necessary to determine patient infection status.  Positive results do not rule out bacterial infection or coinfection with other viruses. The expected result is Negative. COVID 19 Negative SARS CoV 2 target nucleic acids are NOT DETECTED. The SARS CoV 2 RNA is generally detectable in upper and lower respiratory specimens during the acute phase of infection.  Negative results do not preclude SARS CoV 2 infection, do not rule out coinfections with other pathogens, and should not be used as the sole basis for treatment or other patient management decisions.  Negative results must be combined with clinical  observations, patient history, and epidemiological information. The expected result is Negative. Invalid Presence or absence of SARS CoV 2 nucleic acids cannot be determined. Repeat testing was performed on the submitted specimen and repeated Invalid results were obtained.  If clinically indicated, additional testing on a new specimen with an alternate test methodology [id number]) is advised.  The SARS CoV 2 RNA is generally detectable in upper and lower respiratory specimens during the acute phase of infection. The expected result is Negative. Fact Sheet for Patients:  GolfingFamily.no Fact Sheet for Healthcare Providers: https://www.hernandez-brewer.com/ This test is not yet approved or cleared by the [place] FDA and has been authorized for detection and/or diagnosis of SARS CoV 2 by FDA under an Emergency Use Authorization (EUA).  This EUA will remain in effect (meaning this test can be used) for the duration of the COVID19 d eclaration under Section 564(b)(1) of the Act, 21 U.S.C. section [phone] 3(b)(1), unless the authorization is terminated or revoked  sooner. Performed at [facility], [address] [place], [place] [address]    Ct Angio Head W Or Wo Contrast  Result Date: 02/21/2019 CLINICAL DATA:  Cerebral hemorrhage. EXAM: CT ANGIOGRAPHY HEAD TECHNIQUE: Multidetector CT imaging of the head was performed using the standard protocol during bolus administration of intravenous contrast. Multiplanar CT image reconstructions and MIPs were obtained to evaluate the vascular anatomy. CONTRAST:  161mL OMNIPAQUE IOHEXOL 350 MG/ML SOLN COMPARISON:  None. FINDINGS: CTA HEAD Anterior circulation: No significant stenosis, proximal occlusion, aneurysm, or vascular malformation. Unchanged 3 x 4 cm RIGHT parietal hemorrhage. Posterior circulation: No significant stenosis, proximal occlusion, aneurysm, or vascular malformation. Venous sinuses: As permitted by contrast timing, patent. Anatomic variants: None of significance Delayed phase: Not performed. IMPRESSION: Unchanged 3 x 4 cm RIGHT parietal hemorrhage. This likely represents a lobar hypertensive bleed. There is no evidence of large vessel occlusion, saccular/mycotic aneurysm, visible venous thrombosis, or arteriovenous malformation contributing to the observed intracranial hemorrhage. Electronically Signed   By: Staci Righter M.D.   On: 02/21/2019 18:46   Dg Chest 1 View  Result Date: 02/21/2019 CLINICAL DATA:  Altered mental status EXAM: CHEST  1 VIEW COMPARISON:  06/07/2018 FINDINGS: No focal opacity or pleural effusion. Linear scarring in the right lower lung. Stable cardiomediastinal silhouette. No pneumothorax. Old bilateral rib fractures. Old fracture deformity right clavicle IMPRESSION: No active disease. Electronically Signed   By: Donavan Foil M.D.   On: 02/21/2019 18:57   Dg Thoracic Spine 2 View  Result Date: 02/21/2019 CLINICAL DATA:  Fall with back pain EXAM: THORACIC SPINE 2 VIEWS COMPARISON:  02/21/2019, 06/07/2018 FINDINGS: Chronic fracture deformity of the right mid clavicle.  Old bilateral rib fractures. Kyphosis of the lower thoracic spine with treated compression deformities at T11 and T12. Mild compression deformity T4. Remaining vertebral bodies demonstrate normal stature. IMPRESSION: 1. Osseous demineralization limits the exam 2. Chronic compression deformity T4. Treated compression  deformities T11-T12. No definite acute osseous abnormality Electronically Signed   By: Jasmine PangKim  Fujinaga M.D.   On: 02/21/2019 19:00   Dg Lumbar Spine Complete  Result Date: 02/21/2019 CLINICAL DATA:  Fall with back pain EXAM: LUMBAR SPINE - COMPLETE 4+ VIEW COMPARISON:  CT 09/04/2018 FINDINGS: Prior hernia repair. Clips in the right upper quadrant and pelvis. Excreted contrast within the renal collecting systems and urinary bladder. Lumbar alignment within normal limits. Treated compression fractures at T11 and T12. Lumbar vertebra demonstrate normal stature. Trace anterolisthesis L5 on S1, no change IMPRESSION: 1. No acute osseous abnormality 2. Excreted contrast within the renal collecting systems and bladder Electronically Signed   By: Jasmine PangKim  Fujinaga M.D.   On: 02/21/2019 19:03   Ct Head Wo Contrast  Result Date: 02/21/2019 CLINICAL DATA:  Confusion. Possible fall. EXAM: CT HEAD WITHOUT CONTRAST CT CERVICAL SPINE WITHOUT CONTRAST TECHNIQUE: Multidetector CT imaging of the head and cervical spine was performed following the standard protocol without intravenous contrast. Multiplanar CT image reconstructions of the cervical spine were also generated. COMPARISON:  02/14/2018. FINDINGS: CT HEAD FINDINGS Brain: There is an acute RIGHT parietal hemorrhage, 41 x 33 x 46 mm, mild surrounding edema, no RIGHT-to-LEFT shift or intraventricular extension. Slight subarachnoid blood. Elsewhere there is atrophy with chronic microvascular ischemic change. Vascular: Calcification of the cavernous internal carotid arteries consistent with cerebrovascular atherosclerotic disease. No signs of intracranial large  vessel occlusion. Skull: No evidence of skull fracture. No significant scalp hematoma. Sinuses/Orbits: No acute findings. Other: None. CT CERVICAL SPINE FINDINGS Alignment: Straightening of the normal cervical lordosis. 4 mm anterolisthesis C4 on C5. Skull base and vertebrae: No acute fracture. No primary bone lesion or focal pathologic process. Soft tissues and spinal canal: No prevertebral soft tissue swelling. Atherosclerosis. Disc levels:  Multilevel spondylosis, worst at C4-5 and C5-6. Upper chest: Negative. Other: None IMPRESSION: 1. Acute RIGHT parietal hemorrhage, 41 x 33 x 46 mm, with mild surrounding edema but no RIGHT-to-LEFT shift or intraventricular extension. Slight subarachnoid blood. Approximate volume 34 mL. Favor hypertensive etiology. Imaging features are atypical for posttraumatic hematoma, and there is no evidence of skull fracture or significant scalp hematoma. 2. No cervical spine fracture or traumatic subluxation. Multilevel spondylosis. 3. Critical Value/emergent results were called by telephone at the time of interpretation on 02/21/2019 at 4:35 pm to Dr. Linwood DibblesJON KNAPP , who verbally acknowledged these results. Electronically Signed   By: Elsie StainJohn T Curnes M.D.   On: 02/21/2019 16:40   Ct Cervical Spine Wo Contrast  Result Date: 02/21/2019 CLINICAL DATA:  Confusion. Possible fall. EXAM: CT HEAD WITHOUT CONTRAST CT CERVICAL SPINE WITHOUT CONTRAST TECHNIQUE: Multidetector CT imaging of the head and cervical spine was performed following the standard protocol without intravenous contrast. Multiplanar CT image reconstructions of the cervical spine were also generated. COMPARISON:  02/14/2018. FINDINGS: CT HEAD FINDINGS Brain: There is an acute RIGHT parietal hemorrhage, 41 x 33 x 46 mm, mild surrounding edema, no RIGHT-to-LEFT shift or intraventricular extension. Slight subarachnoid blood. Elsewhere there is atrophy with chronic microvascular ischemic change. Vascular: Calcification of the cavernous  internal carotid arteries consistent with cerebrovascular atherosclerotic disease. No signs of intracranial large vessel occlusion. Skull: No evidence of skull fracture. No significant scalp hematoma. Sinuses/Orbits: No acute findings. Other: None. CT CERVICAL SPINE FINDINGS Alignment: Straightening of the normal cervical lordosis. 4 mm anterolisthesis C4 on C5. Skull base and vertebrae: No acute fracture. No primary bone lesion or focal pathologic process. Soft tissues and spinal canal: No prevertebral soft tissue swelling.  Atherosclerosis. Disc levels:  Multilevel spondylosis, worst at C4-5 and C5-6. Upper chest: Negative. Other: None IMPRESSION: 1. Acute RIGHT parietal hemorrhage, 41 x 33 x 46 mm, with mild surrounding edema but no RIGHT-to-LEFT shift or intraventricular extension. Slight subarachnoid blood. Approximate volume 34 mL. Favor hypertensive etiology. Imaging features are atypical for posttraumatic hematoma, and there is no evidence of skull fracture or significant scalp hematoma. 2. No cervical spine fracture or traumatic subluxation. Multilevel spondylosis. 3. Critical Value/emergent results were called by telephone at the time of interpretation on 02/21/2019 at 4:35 pm to Dr. Linwood DibblesJON KNAPP , who verbally acknowledged these results. Electronically Signed   By: Elsie StainJohn T Curnes M.D.   On: 02/21/2019 16:40    Pending Labs Unresulted Labs (From admission, onward)   None      Vitals/Pain Today's Vitals   02/21/19 1823 02/21/19 1830 02/21/19 1845 02/21/19 1915  BP:  138/74 (!) 114/54 113/74  Pulse: (!) 102 (!) 104  (!) 110  Resp: 18 19 (!) 21 20  Temp: 98.9 F (37.2 C)     TempSrc: Oral     SpO2: 94% 95%  92%  Weight:      Height:      PainSc: 6    5     Isolation Precautions No active isolations  Medications Medications  nicardipine (CARDENE) 20mg  in 0.86% saline 200ml IV infusion (0.1 mg/ml) (2.5 mg/hr Intravenous New Bag/Given 02/21/19 1650)  iohexol (OMNIPAQUE) 350 MG/ML injection  100 mL (100 mLs Intravenous Contrast Given 02/21/19 1753)    Mobility walks with device High fall risk   Focused Assessments Neuro Assessment Handoff:  Swallow screen pass? No Pt kept Npo at this time. Cardiac Rhythm: Normal sinus rhythm       Neuro Assessment: Exceptions to WDL Neuro Checks:      Last Documented NIHSS Modified Score:   Has TPA been given? No If patient is a Neuro Trauma and patient is going to OR before floor call report to 4N Charge nurse: [phone] or [phone]     R Recommendations: See Admitting Provider Note  Report given to:   Additional Notes: patient self caths for urinary retention.  Tolerates well.

## 2019-02-21 NOTE — ED Notes (Signed)
[facility] notified Renee Fritz) - patient ready for transport

## 2019-02-21 NOTE — ED Notes (Signed)
Pt to CT/XR accompanied by RN

## 2019-02-21 NOTE — ED Notes (Signed)
BE-FAST neuro exam negative

## 2019-02-21 NOTE — ED Notes (Signed)
Pt admits she may have fallen this morning sometime.  She says she remembers looking for her glasses because she couldn't see very well, and hit the wall with her left forehead.  Small raised, bruised area visible.  Pt answering simple questions, following commands.  Family member at bedside states she was fine at birthday party yesterday but when family spoke to her today she didn't seem like herself.  No facial asymmetry, equal hand grips.  Pt denies fever, does report mild headache, states "I feel like my blood pressure may be up or maybe I have a urine infection."

## 2019-02-21 NOTE — ED Notes (Signed)
Pt's cane and handicapped car tag given to pt's son at bedside

## 2019-02-21 NOTE — ED Notes (Signed)
Leaving with [facility] at this time. 

## 2019-02-21 NOTE — ED Notes (Signed)
Pt awake, answers simple questions appropriately, follows commands.  Awaiting bed assignment at hospital.

## 2019-02-21 NOTE — ED Notes (Signed)
Return from CT.  Pt tolerated well

## 2019-02-21 NOTE — ED Notes (Signed)
Patient transported to CT. RN with pt and pt on monitor

## 2019-02-22 ENCOUNTER — Inpatient Hospital Stay (HOSPITAL_COMMUNITY): Payer: Medicare Other

## 2019-02-22 ENCOUNTER — Encounter (HOSPITAL_COMMUNITY): Payer: Self-pay | Admitting: *Deleted

## 2019-02-22 LAB — MRSA PCR SCREENING: MRSA by PCR: POSITIVE — AB

## 2019-02-22 MED ORDER — POTASSIUM CHLORIDE 20 MEQ/15ML (10%) PO SOLN
10.0000 meq | Freq: Two times a day (BID) | ORAL | Status: DC
  Administered 2019-02-22 – 2019-02-24 (×3): 10 meq via ORAL
  Filled 2019-02-22 (×4): qty 15

## 2019-02-22 MED ORDER — ORAL CARE MOUTH RINSE
15.0000 mL | Freq: Two times a day (BID) | OROMUCOSAL | Status: DC
  Administered 2019-02-22 – 2019-02-24 (×4): 15 mL via OROMUCOSAL

## 2019-02-22 MED ORDER — GADOBUTROL 1 MMOL/ML IV SOLN
8.0000 mL | Freq: Once | INTRAVENOUS | Status: AC | PRN
  Administered 2019-02-22: 8 mL via INTRAVENOUS

## 2019-02-22 MED ORDER — CHLORHEXIDINE GLUCONATE CLOTH 2 % EX PADS
6.0000 | MEDICATED_PAD | Freq: Every day | CUTANEOUS | Status: DC
  Administered 2019-02-23 – 2019-02-24 (×2): 6 via TOPICAL

## 2019-02-22 MED ORDER — HALOPERIDOL LACTATE 5 MG/ML IJ SOLN
1.0000 mg | Freq: Once | INTRAMUSCULAR | Status: AC
  Administered 2019-02-22: 14:00:00 1 mg via INTRAVENOUS
  Filled 2019-02-22: qty 1

## 2019-02-22 NOTE — Progress Notes (Signed)
Daughter Maudie Mercury called to speak to pt. This nurses unit phone given to pt.

## 2019-02-22 NOTE — Evaluation (Signed)
Clinical/Bedside Swallow Evaluation Patient Details  Name: Renee Fritz MRN: [id number] Date of Birth: [birth year]  Today's Date: 02/22/2019 Time: SLP Start Time (ACUTE ONLY): 1045 SLP Stop Time (ACUTE ONLY): 1115 SLP Time Calculation (min) (ACUTE ONLY): 30 min  Past Medical History:  Past Medical History:  Diagnosis Date  . Hypertension   . Osteoporosis   . Other specified cardiac arrhythmias   . Pneumonia   . Ulcerative (chronic) enterocolitis (Renee Fritz)    Past Surgical History:  Past Surgical History:  Procedure Laterality Date  . FEMORAL HERNIA REPAIR Left 07/03/2017   Procedure: HERNIA REPAIR FEMORAL;  Surgeon: Coralie Keens, MD;  Location: [facility];  Service: General;  Laterality: Left;  . urinary ostomy     HPI:   Renee Fritz is an 85 y.o. female presenting in transfer from [place] with AMS,  CT head revealed a 34 mL right parietofrontal acute ICH.    Assessment / Plan / Recommendation Clinical Impression  Pt passes 3 oz water swallow (yale) without difficulty. Able to masticate regular textures. Will need assist with meals due to visuospatial deficits. No SLP f/u needed for swallowing. See next note for cognitive linguistic eval.  SLP Visit Diagnosis: Dysphagia, unspecified (R13.10)    Aspiration Risk  Mild aspiration risk    Diet Recommendation Regular;Thin liquid   Liquid Administration via: Cup;Straw Medication Administration: Whole meds with liquid Supervision: Staff to assist with self feeding    Other  Recommendations Oral Care Recommendations: Oral care BID   Follow up Recommendations None      Frequency and Duration            Prognosis        Swallow Study   General HPI:  Renee Fritz is an 85 y.o. female presenting in transfer from [place] [facility] with AMS,  CT head revealed a 34 mL right parietofrontal acute ICH.  Type of Study: Bedside Swallow Evaluation Diet Prior to this Study: Regular;Thin liquids Temperature Spikes  Noted: No Respiratory Status: Room air History of Recent Intubation: No Behavior/Cognition: Alert;Cooperative;Pleasant mood Oral Cavity Assessment: Within Functional Limits Oral Care Completed by SLP: No Oral Cavity - Dentition: Adequate natural dentition Vision: Functional for self-feeding Self-Feeding Abilities: Able to feed self Patient Positioning: Upright in bed Baseline Vocal Quality: Normal Volitional Cough: Strong Volitional Swallow: Able to elicit    Oral/Motor/Sensory Function     Ice Chips     Thin Liquid Thin Liquid: Within functional limits Presentation: Cup;Self Fed    Nectar Thick Nectar Thick Liquid: Not tested   Honey Thick Honey Thick Liquid: Not tested   Puree Puree: Not tested   Solid     Solid: Within functional limits Presentation: Self Fed     Herbie [place], MA [place] Pager [phone] Office [phone]  Lynann Beaver 02/22/2019,11:57 AM

## 2019-02-22 NOTE — Progress Notes (Signed)
STROKE TEAM PROGRESS NOTE   HISTORY OF PRESENT ILLNESS (per Dr Otelia LimesLindzen) Renee Fritz an 85 y.o. female presenting [facility] with an acute right parietofrontal hemorrhage. [facility] in the afternoon today with her son. She was LKN yesterday. She was confused and not acting appropriately this AM when family checked on her. For example, she had trouble making a cup of tea, forgetting items and leaving items on the floor, appeared disheveled and was unable to figure out how to exit the home. The son thought that maybe the patient had fallen overnight. The patient later stated that she had stumbled this morning and ran into the wall with her left forehead - a small raised bruise was noted there by ED staff. She was able to answer questions in Triage [facility] head revealed a 34 mL right parietofrontal acute ICH. She Fritz on ASA at home but does not take a blood thinner. She has HTN. She was started on nicardipine gtt at the OSH, then [facility] management.   CTA of head: There Fritz no evidence of large vessel occlusion, saccular/mycotic aneurysm, visible venous thrombosis, or arteriovenous malformation contributing to the observed intracranial hemorrhage.  CT head: 1. Acute RIGHT parietal hemorrhage, 41 x 33 x 46 mm, with mild surrounding edema but no RIGHT-to-LEFT shift or intraventricular extension. Slight subarachnoid blood. Approximate volume 34 mL. Favor hypertensive etiology. Imaging features are atypical for posttraumatic hematoma, and there Fritz no evidence of skull fracture or significant scalp hematoma. 2. No cervical spine fracture or traumatic subluxation. Multilevel spondylosis.  EKG: Sinus rhythm Minimal ST elevation, anterior leads Baseline wander in lead(s) V3 V5 V6 Since last tracing rate slower   SUBJECTIVE (INTERVAL HISTORY) I have personally reviewed history of presenting illness with the patient in  details.  I have reviewed imaging films in PACS personally.  Patient states she started with a headache and then noticed bumping into objects on the left side and confusion.  CT scan shows a 4 x 3.3 x 4.6 mm right frontal hemorrhage with mild right left shift and intraventricular hemorrhage.  She states she Fritz doing much better today headache Fritz resolved.  Blood pressure Fritz adequately controlled.     OBJECTIVE Vitals:   02/22/19 0400 02/22/19 0417 02/22/19 0749 02/22/19 0800  BP: (!) 121/53   (!) 112/46  Pulse: 91   65  Resp: 18   18  Temp:  98 F (36.7 C) (!) 97.5 F (36.4 C)   TempSrc:  Oral Oral   SpO2: 99%   96%  Weight:      Height:        CBC:  Recent Labs  Lab 02/21/19 1533  WBC 6.8  NEUTROABS 4.8  HGB 12.2  HCT 37.6  MCV 92.6  PLT 260    Basic Metabolic Panel:  Recent Labs  Lab 02/21/19 1533  NA 137  K 4.0  CL 103  CO2 26  GLUCOSE 97  BUN 13  CREATININE 0.58  CALCIUM 8.8*    Lipid Panel: No results found for: CHOL, TRIG, HDL, CHOLHDL, VLDL, LDLCALC HgbA1c:  Lab Results  Component Value Date   HGBA1C 5.8 (H) 07/04/2017   Urine Drug Screen: No results found for: LABOPIA, COCAINSCRNUR, LABBENZ, AMPHETMU, THCU, LABBARB  Alcohol Level No results found for: ETH  IMAGING  Ct Angio Head W Or Wo Contrast 02/21/2019 IMPRESSION:  Unchanged 3 x 4 cm RIGHT parietal hemorrhage. This  likely represents a lobar hypertensive bleed. There Fritz no evidence of large vessel occlusion, saccular/mycotic aneurysm, visible venous thrombosis, or arteriovenous malformation contributing to the observed intracranial hemorrhage.    MRI Brain W&WO Contrast - pending  Dg Chest 1 View 02/21/2019 IMPRESSION: No active disease.   Dg Thoracic Spine 2 View 02/21/2019 IMPRESSION:  1. Osseous demineralization limits the exam  2. Chronic compression deformity T4. Treated compression deformities T11-T12. No definite acute osseous abnormality   Dg Lumbar Spine  Complete 02/21/2019 IMPRESSION:  1. No acute osseous abnormality  2. Excreted contrast within the renal collecting systems and bladder   Ct Head Wo Contrast  02/21/2019 IMPRESSION:  Acute RIGHT parietal hemorrhage, 41 x 33 x 46 mm, with mild surrounding edema but no RIGHT-to-LEFT shift or intraventricular extension. Slight subarachnoid blood. Approximate volume 34 mL. Favor hypertensive etiology. Imaging features are atypical for posttraumatic hematoma, and there Fritz no evidence of skull fracture or significant scalp hematoma.   Ct Cervical Spine Wo Contrast 02/21/2019 IMPRESSION:  No cervical spine fracture or traumatic subluxation. Multilevel spondylosis.    Transthoracic Echocardiogram  00/00/2020 Pending   EKG - SR rate 89 BPM. (See cardiology reading for complete details)    PHYSICAL EXAM Blood pressure (!) 112/46, pulse 65, temperature (!) 97.5 F (36.4 C), temperature source Oral, resp. rate 18, height 5' (1.524 m), weight 41.8 kg, SpO2 96 %. Pleasant frail elderly Caucasian lady not in distress. . Afebrile. Head Fritz nontraumatic. Neck Fritz supple without bruit.    Cardiac exam no murmur or gallop. Lungs are clear to auscultation. Distal pulses are well felt. Neurological Exam ;  Awake  Alert oriented x 3.  Diminished attention, registration and recall.  No aphasia apraxia or dysarthria.  Normal speech and language..fundi were not visualized..  Right gaze preference can look to the left slightly past midline but not all the way.  Dense left homonymous hemianopsia.  Mild left-sided neglect.Marland Kitchen Hearing Fritz normal. Palatal movements are normal. Face symmetric. Tongue midline. Normal strength, tone, reflexes and coordination except mild weakness of left grip and intrinsic hand muscles.  Fine finger movements are diminished on the left.  Orbits right over left upper extremity.. Normal sensation. Gait deferred.      ASSESSMENT/PLAN Renee Fritz a 85 y.o. female with history of  hypertension, cardiac arrthymias (unknown),  and ulcerative colitis presenting with AMS. She did not receive IV t-PA due to [place].  RIGHT parietal hemorrhage likely due to uncontrolled hypertension.  Resultant left gaze palsy and left homonymous hemianopsia and mild left hand weakness  CT head - Acute RIGHT parietal hemorrhage, 41 x 33 x 46 mm, with mild surrounding edema but no RIGHT-to-LEFT shift or intraventricular extension. Slight subarachnoid blood. Approximate volume 34 mL.  MRI W&WO Head - pending  MRA head - not performed  CTA Head - Unchanged 3 x 4 cm RIGHT parietal hemorrhage. This likely represents a lobar hypertensive bleed.  Carotid Doppler - not indicated  2D Echo - pending  Sars Corona Virus 2  - negative  U/A - unremarkable  LDL - not performed  HgbA1c - not performed  UDS - not performed  VTE prophylaxis - scds    Diet - NPO  No antithrombotic prior to admission, now on No antithrombotic  Ongoing aggressive stroke risk factor management  Therapy recommendations:  pending  Disposition:  Pending  Hypertension  Stable . SBP goal < 160 mmHg . Long-term BP goal normotensive  Hyperlipidemia  Lipid lowering medication PTA:  Vytorin 10/20 mg daily -> D/C due to hemorrhage  LDL not performed, goal < 70  Current lipid lowering medication: none  Continue statin at discharge   Other Stroke Risk Factors  Advanced age   Other Active Problems  Hospital day # 1  I have personally obtained history,examined this patient, reviewed notes, independently viewed imaging studies, participated in medical decision making and plan of care.ROS completed by me personally and pertinent positives fully documented  I have made any additions or clarifications directly to the above note.  She presented with headache and left-sided gaze palsy and vision loss secondary to right frontoparietal parenchymal hemorrhage possibly hypertensive versus amyloid angiopathy.  She  remains at significant risk for neurological worsening and hematoma expansion.  Recommend close neurological monitoring in the ICU for 24 hours and strict blood pressure control with systolic goal below 140 for 24 hours and then below 160.  Check MRI scan of the brain later today.  No need for hypertonic saline at the present time.  No need for neurosurgical intervention at the present time.  Long discussion with patient and answered questions.  Mobilize in bed physical occupational speech therapy consults. This patient Fritz critically ill and at significant risk of neurological worsening, death and care requires constant monitoring of vital signs, hemodynamics,respiratory and cardiac monitoring, extensive review of multiple databases, frequent neurological assessment, discussion with family, other specialists and medical decision making of high complexity.I have made any additions or clarifications directly to the above note.This critical care time does not reflect procedure time, or teaching time or supervisory time of PA/NP/Med Resident etc but could involve care discussion time.  I spent 30 minutes of neurocritical care time  in the care of  this patient.      Delia HeadyPramod Geno Sydnor, MD Medical Director [facility] Pager: [phone] 02/22/2019 1:35 PM   To contact Stroke Continuity provider, please refer to [email or web address]. After hours, contact General Neurology

## 2019-02-22 NOTE — Evaluation (Signed)
Speech Language Pathology Evaluation Patient Details Name: Renee Fritz Hageman MRN: [id number] DOB: 10-Feb-1926 Today's Date: 02/22/2019 Time: [id number] SLP Time Calculation (min) (ACUTE ONLY): 30 min  Problem List:  Patient Active Problem List   Diagnosis Date Noted  . Cerebral hemorrhage (HCC) 02/21/2019  . ICH (intracerebral hemorrhage) (HCC) 02/21/2019  . AKI (acute kidney injury) (HCC)   . Diarrhea   . Hypokalemia   . Intractable vomiting with nausea   . S/P hernia repair   . Incarcerated hernia 07/03/2017  . Incarcerated femoral hernia 07/03/2017   Past Medical History:  Past Medical History:  Diagnosis Date  . Hypertension   . Osteoporosis   . Other specified cardiac arrhythmias   . Pneumonia   . Ulcerative (chronic) enterocolitis (HCC)    Past Surgical History:  Past Surgical History:  Procedure Laterality Date  . FEMORAL HERNIA REPAIR Left 07/03/2017   Procedure: HERNIA REPAIR FEMORAL;  Surgeon: Abigail MiyamotoBlackman, Douglas, MD;  [facility];  Service: General;  Laterality: Left;  . urinary ostomy     HPI:   Renee Fritz Cambre is an 85 y.o. female presenting in transfer from [facility] with AMS,  CT head revealed a 34 mL right parietofrontal acute ICH.    Assessment / Plan / Recommendation Clinical Impression  Pt demonstrates deficits associated with right hemisphere impairment including left visuospatial neglect/field cut, decreased awareness of deficits, excessive verbosity. Pt is oriented and able to participate in interventions but dmoenstraes very little carry over given short term memory deficits. She is aware of visual deficits but does not compensate and when instucting of searching for the left edge of tray and sweeping gaze right her function improved, but only with ongoing assist. She verbalizes she should not drive. Appears her hearing is slightly impaired as well, which decreased her ability to follow turn taking cues in conversation and contribues to [employer]  verbosity. Will continue efforts. Pt may benefit from inpatient rehabilitation to improve participation with ADLS with caregiver supervision and assist likely needed into the long term.     SLP Assessment  SLP Recommendation/Assessment: Patient needs continued Speech Lanaguage Pathology Services SLP Visit Diagnosis: Frontal lobe and executive function deficit Frontal lobe and executive function deficit following: Cerebral infarction    Follow Up Recommendations  None    Frequency and Duration min 2x/week  2 weeks      SLP Evaluation Cognition  Overall Cognitive Status: Impaired/Different from baseline Arousal/Alertness: Awake/alert Orientation Level: Oriented X4 Attention: Focused;Sustained;Selective;Alternating Focused Attention: Appears intact Sustained Attention: Impaired Sustained Attention Impairment: Verbal basic;Functional basic Selective Attention: Impaired Selective Attention Impairment: Verbal basic;Functional basic Alternating Attention: Impaired Alternating Attention Impairment: Verbal basic;Functional basic Memory: Impaired Memory Impairment: Storage deficit Awareness: Impaired Awareness Impairment: Intellectual impairment;Emergent impairment(better anticipatory awareness than other ) Problem Solving: Impaired Problem Solving Impairment: Functional basic Executive Function: Self Monitoring Self Monitoring: Impaired Self Monitoring Impairment: Verbal basic Safety/Judgment: Impaired       Comprehension  Auditory Comprehension Overall Auditory Comprehension: Appears within functional limits for tasks assessed    Expression Verbal Expression Overall Verbal Expression: Appears within functional limits for tasks assessed Pragmatics: Impairment Impairments: Topic appropriateness;Topic maintenance;Turn Taking   Oral / Motor  Oral Motor/Sensory Function Overall Oral Motor/Sensory Function: Within functional limits Motor Speech Overall Motor Speech: Appears  within functional limits for tasks assessed   GO                   Harlon DittyBonnie Cicilia Clinger, MA CCC-SLP  Acute Rehabilitation Services Pager [phone] Office [phone]  Sindy Mccune, Katherene Ponto 02/22/2019, 12:10 PM

## 2019-02-23 ENCOUNTER — Inpatient Hospital Stay (HOSPITAL_COMMUNITY): Payer: Medicare Other

## 2019-02-23 DIAGNOSIS — I361 Nonrheumatic tricuspid (valve) insufficiency: Secondary | ICD-10-CM

## 2019-02-23 LAB — GLUCOSE, CAPILLARY: Glucose-Capillary: 85 mg/dL (ref 70–99)

## 2019-02-23 LAB — ECHOCARDIOGRAM COMPLETE
Height: 60 in
Weight: 1474.44 oz

## 2019-02-23 MED ORDER — METOPROLOL TARTRATE 5 MG/5ML IV SOLN
2.5000 mg | INTRAVENOUS | Status: DC | PRN
  Administered 2019-02-23 – 2019-02-24 (×5): 2.5 mg via INTRAVENOUS
  Filled 2019-02-23 (×4): qty 5

## 2019-02-23 NOTE — Progress Notes (Signed)
eLink Physician-Brief Progress Note Patient Name: Renee Fritz DOB: 02/18/1926 MRN: [id number]   Date of Service  02/23/2019  HPI/Events of Note  Pt is confused, trying to get out of bed.   eICU Interventions  1:1 sitter ordered     Intervention Category Minor Interventions: Agitation / anxiety - evaluation and management  Elsie Lincoln 02/23/2019, 4:37 AM

## 2019-02-23 NOTE — Progress Notes (Signed)
  Echocardiogram 2D Echocardiogram has been performed.  Renee Fritz 02/23/2019, 9:50 AM

## 2019-02-23 NOTE — Evaluation (Signed)
Physical Therapy Evaluation Patient Details Name: Renee Fritz MRN: [id number] DOB: 1925-10-03 Today's Date: 02/23/2019   History of Present Illness  Pt adm with AMS,  CT head revealed a 34 mL right parietofrontal acute ICH.  PMH - ulcerative colitis, HTN, cad, htn, femoral hernia repair  Clinical Impression  Pt presents to PT with significant impairments due to weakness and cognitive deficits including poor attention and lt neglect. Pt's daughter present via video throughout session. She reports that family had been looking at independent senior living apartments for [place] in [place] prior to pandemic. Daughter recognizes that pt likely to need extended rehab and likely would prefer her placed in [place] area. Daughter also recognizes that after rehab pt may need assisted living instead of independent living.    Follow Up Recommendations SNF    Equipment Recommendations  Other (comment)(To be determined)    Recommendations for Other Services       Precautions / Restrictions Precautions Precautions: Fall;Other (comment) Precaution Comments: lt neglect, field cut      Mobility  Bed Mobility Overal bed mobility: Needs Assistance Bed Mobility: Supine to Sit     Supine to sit: Max assist     General bed mobility comments: Assist to bring legs off bed, elevate trunk into sitting, and bring hips to EOB. Incr assist needed due to coming to lt side of bed and pt with neglect  Transfers Overall transfer level: Needs assistance Equipment used: 1 person hand held assist;Rolling walker (2 wheeled) Transfers: Sit to/from [employer]Stand;Stand Pivot Transfers Sit to Stand: Min assist Stand pivot transfers: Mod assist       General transfer comment: assist to bring hips up and for balance. Manual facilitation to put lt hand onto walker. For pivot from bed to chair pt with decr LLE stepping and difficulty turning to lt to chair.  Ambulation/Gait Ambulation/Gait assistance: Mod assist Gait Distance  (Feet): 25 Feet Assistive device: Rolling walker (2 wheeled);1 person hand held assist Gait Pattern/deviations: Step-to pattern;Decreased step length - left;Trunk flexed Gait velocity: decr Gait velocity interpretation: <1.31 ft/sec, indicative of household ambulator General Gait Details: Assist for balance and support. Lt hand had to be placed on walker. Once lt hand on walker it maintained grip. Assist steering walker. Switched to [facility]  with even more pronounced lagging of LLE and pt rotating body so stepping was almost a side step  Information systems managertairs            Wheelchair Mobility    Modified Rankin (Stroke Patients Only) Modified Rankin (Stroke Patients Only) Pre-Morbid Rankin Score: No significant disability Modified Rankin: Moderately severe disability     Balance Overall balance assessment: Needs assistance Sitting-balance support: Bilateral upper extremity supported;Feet supported Sitting balance-Leahy Scale: Fair Sitting balance - Comments: Sat EOB with supervision x 10 minutes   Standing balance support: Single extremity supported Standing balance-Leahy Scale: Poor Standing balance comment: min assist for static standing                             Pertinent Vitals/Pain Pain Assessment: Faces Faces Pain Scale: Hurts even more Pain Location: head and neck Pain Descriptors / Indicators: Aching;Tightness Pain Intervention(s): Limited activity within patient's tolerance;Monitored during session;Patient requesting pain meds-RN notified    Home Living Family/patient expects to be discharged to:: Private residence Living Arrangements: Alone Available Help at Discharge: Personal care attendant(2 days a week) Type of Home: House Home Access: Stairs to enter Entrance Stairs-Rails: Right;Left  Home Layout: Two level;Bed/bath upstairs Home Equipment: Grab bars - tub/shower;Walker - 2 wheels;Cane - single point Additional Comments: Pt performs self cath independently     Prior Function Level of Independence: Needs assistance   Gait / Transfers Assistance Needed: modified independent with amb and stairs. Uses cane            Hand Dominance   Dominant Hand: Right    Extremity/Trunk Assessment   Upper Extremity Assessment Upper Extremity Assessment: Defer to OT evaluation    Lower Extremity Assessment Lower Extremity Assessment: LLE deficits/detail LLE Deficits / Details: functionally weaker than rt. Neglect.  LLE Sensation: decreased light touch       Communication   Communication: No difficulties  Cognition Arousal/Alertness: Awake/alert Behavior During Therapy: WFL for tasks assessed/performed Overall Cognitive Status: Impaired/Different from baseline Area of Impairment: Orientation;Attention;Memory;Following commands;Safety/judgement;Awareness;Problem solving                 Orientation Level: Disoriented to;Time;Situation;Place Current Attention Level: Focused Memory: Decreased short-term memory Following Commands: Follows one step commands inconsistently Safety/Judgement: Decreased awareness of safety;Decreased awareness of deficits Awareness: Intellectual Problem Solving: Requires verbal cues;Requires tactile cues;Difficulty sequencing General Comments: Pt with significant lt neglect. Difficulty attending to any task or question      General Comments      Exercises     Assessment/Plan    PT Assessment Patient needs continued PT services  PT Problem List Decreased strength;Decreased activity tolerance;Decreased balance;Decreased mobility;Decreased cognition;Decreased safety awareness;Impaired sensation       PT Treatment Interventions DME instruction;Gait training;Functional mobility training;Therapeutic activities;Therapeutic exercise;Balance training;Neuromuscular re-education;Cognitive remediation;Patient/family education    PT Goals (Current goals can be found in the Care Plan section)  Acute Rehab PT  Goals Patient Stated Goal: see her cat PT Goal Formulation: With family Time For Goal Achievement: 03/09/19 Potential to Achieve Goals: Fair    Frequency Min 3X/week   Barriers to discharge Inaccessible home environment;Decreased caregiver support Lives alone and bedroom is on the second floor    Co-evaluation               AM-PAC PT "6 Clicks" Mobility  Outcome Measure Help needed turning from your back to your side while in a flat bed without using bedrails?: A Lot Help needed moving from lying on your back to sitting on the side of a flat bed without using bedrails?: A Lot Help needed moving to and from a bed to a chair (including a wheelchair)?: A Lot Help needed standing up from a chair using your arms (e.g., wheelchair or bedside chair)?: A Little Help needed to walk in hospital room?: A Lot Help needed climbing 3-5 steps with a railing? : Total 6 Click Score: 12    End of Session Equipment Utilized During Treatment: Gait belt Activity Tolerance: Patient tolerated treatment well Patient left: in chair;with call bell/phone within reach;with chair alarm set;Other (comment)(OT present) Nurse Communication: Mobility status PT Visit Diagnosis: Unsteadiness on feet (R26.81);Other abnormalities of gait and mobility (R26.89);Other symptoms and signs involving the nervous system (R29.898)    Time: 6962-9528 PT Time Calculation (min) (ACUTE ONLY): 43 min   Charges:   PT Evaluation $PT Eval Moderate Complexity: 1 Mod PT Treatments $Therapeutic Activity: 23-37 mins        [place] Pager [phone] Office [place] 02/23/2019, 1:08 PM

## 2019-02-23 NOTE — Progress Notes (Signed)
Attempted to give report, per secretary, they have no clean rooms at the moment and are working on it.

## 2019-02-23 NOTE — Progress Notes (Signed)
OT Cancellation Note  Patient Details Name: Truc Winfree MRN: [id number] DOB: 05-04-1926   Cancelled Treatment:    Reason Eval/Treat Not Completed: Active bedrest order(RN attempting to reach MD to discontinue.)  Malka So 02/23/2019, 10:24 AM  Nestor Lewandowsky, OTR/L Acute Rehabilitation Services Pager: [phone] Office: [phone]

## 2019-02-23 NOTE — Progress Notes (Signed)
STROKE TEAM PROGRESS NOTE   SUBJECTIVE (INTERVAL HISTORY) She is neurologically stable.  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  Blood pressure adequately controlled.  MRI scan of the brain shows what looks like a ischemic large right hemispheric infarct with hemorrhagic transformation.  OBJECTIVE Vitals:   02/23/19 1000 02/23/19 1100 02/23/19 1200 02/23/19 1400  BP: (!) 124/51 133/69 131/65 140/67  Pulse: 72 70 71 79  Resp: 16 16 17  (!) 21  Temp:  97.6 F (36.4 C)    TempSrc:  Axillary    SpO2: 98% 96% 98% 99%  Weight:      Height:        CBC:  Recent Labs  Lab 02/21/19 1533  WBC 6.8  NEUTROABS 4.8  HGB 12.2  HCT 37.6  MCV 92.6  PLT 260    Basic Metabolic Panel:  Recent Labs  Lab 02/21/19 1533  NA 137  K 4.0  CL 103  CO2 26  GLUCOSE 97  BUN 13  CREATININE 0.58  CALCIUM 8.8*    Lipid Panel: No results found for: CHOL, TRIG, HDL, CHOLHDL, VLDL, LDLCALC HgbA1c:  Lab Results  Component Value Date   HGBA1C 5.8 (H) 07/04/2017    IMAGING p Ct Head Wo Contrast  02/21/2019 Acute RIGHT parietal hemorrhage, 41 x 33 x 46 mm, with mild surrounding edema but no RIGHT-to-LEFT shift or intraventricular extension. Slight subarachnoid blood. Approximate volume 34 mL. Favor hypertensive etiology. Imaging features are atypical for posttraumatic hematoma, and there is no evidence of skull fracture or significant scalp hematoma.   Ct Cervical Spine Wo Contrast 02/21/2019 No cervical spine fracture or traumatic subluxation. Multilevel spondylosis.   Ct Angio Head W Or Wo Contrast 02/21/2019 Unchanged 3 x 4 cm RIGHT parietal hemorrhage. This likely represents a lobar hypertensive bleed. There is no evidence of large vessel occlusion, saccular/mycotic aneurysm, visible venous thrombosis, or arteriovenous malformation contributing to the observed intracranial hemorrhage.   MRI Brain W&WO Contrast  02/22/2019  Motion degraded study of marginal diagnostic utility  demonstrates a large RIGHT parietal hemorrhage, not clearly increased in size from priors. No visible midline shift.  Abnormal signal on diffusion-weighted imaging is largely related to susceptibility artifact, but there may be gyriform like acute infarction of the medial RIGHT parietal lobe, as well as a punctate focus of acute infarction in the RIGHT occipital lobe.   There is no visible abnormal postcontrast enhancement to suggest underlying mass, when axial precontrast T1 weighted images are compared with those postcontrast.  Consider repeat MRI brain in 6 weeks, when there has been partial involution of the hemorrhage, as well as hopefully improvement in the clinical status so that the patient might remain motionless.  Dg Chest 1 View 02/21/2019 No active disease.   Dg Thoracic Spine 2 View 02/21/2019 1. Osseous demineralization limits the exam  2. Chronic compression deformity T4. Treated compression deformities T11-T12. No definite acute osseous abnormality   Dg Lumbar Spine Complete 02/21/2019 1. No acute osseous abnormality  2. Excreted contrast within the renal collecting systems and bladder   Transthoracic Echocardiogram  02/23/2019  1. The left ventricle has normal systolic function with an ejection fraction of 60-65%. The cavity size was normal. There is mildly increased left ventricular wall thickness. Left ventricular diastolic Doppler parameters are consistent with impaired relaxation.  2. The mitral valve is grossly normal. There is mild mitral annular calcification present.  3. The tricuspid valve is grossly normal.  4. The aortic valve is tricuspid. Mild thickening of  the aortic valve. Aortic valve regurgitation is trivial by color flow Doppler. No stenosis of the aortic valve.  5. Normal LV systolic function; mild diastolic dysfunction; mild LVH; trace AI.  EKG - SR rate 89 BPM. (See cardiology reading for complete details)    PHYSICAL EXAM Blood pressure 140/67,  pulse 79, temperature 97.6 F (36.4 C), temperature source Axillary, resp. rate (!) 21, height 5' (1.524 m), weight 41.8 kg, SpO2 99 %. Pleasant frail elderly Caucasian lady not in distress. . Afebrile. Head is nontraumatic. Neck is supple without bruit.    Cardiac exam no murmur or gallop. Lungs are clear to auscultation. Distal pulses are well felt. Neurological Exam ;  Awake  Alert oriented x 3.  Diminished attention, registration and recall.  No aphasia apraxia or dysarthria.  Normal speech and language..fundi were not visualized..  Right gaze preference can look to the left slightly past midline but not all the way.  Dense left homonymous hemianopsia.  Mild left-sided neglect.Marland Kitchen. Hearing is normal. Palatal movements are normal. Face symmetric. Tongue midline. Normal strength, tone, reflexes and coordination except mild weakness of left grip and intrinsic hand muscles.  Fine finger movements are diminished on the left.  Orbits right over left upper extremity.. Normal sensation. Gait deferred.      ASSESSMENT/PLAN [name] is a 85 y.o. female with history of hypertension, cardiac arrthymias (unknown),  and ulcerative colitis presenting with AMS. She did not receive IV t-PA due to ICH.  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 Slight subarachnoid blood. Approximate volume 34 mL.  MRI W&WO Head - large R parietal hmg. Roccipital lobe infarct. No enhancement.   CTA Head - Unchanged 3 x 4 cm RIGHT parietal hemorrhage. This likely represents a lobar hypertensive bleed.  2D Echo - EF 60-65%. No source of embolus   [employer] Cor[employer] 2  -  negative  U/A - unremarkable  LDL - pending   HgbA1c - pending   UDS - not performed  VTE prophylaxis - scds  No antithrombotic prior to admission, now on No antithrombotic given hmg  Consider OP 30d montir  Therapy recommendations:  SNF. Ok to be OOB  Disposition:  Pending  Hypertension  Stable . SBP goal < 160 mmHg . Long-term BP goal normotensive  Hyperlipidemia  Lipid lowering medication PTA:  Vytorin 10/20 mg daily -> D/C due to hemorrhage  Current lipid lowering medication: none  LDL pending, goal < 70  Plan to resume statin 6/23 based on LDL  Continue statin at discharge  Other Stroke Risk Factors  Advanced age  Other Active Problems  Confusion, suspect baseline dementia  Hospital day # 2  The patient remains neurologically stable.  MRI scan does show what looks like a hemorrhagic infarct.  Patient is unlikely to be a candidate for anticoagulation given the extent of the brain hemorrhage hence will not pursue aggressive work-up for A. fib at the present time.  May consider outpatient 30-day heart monitor after discharge.  Mobilize out of bed.  Physical occupational therapy and rehab consults.  Transfer to neurology floor bed today.  Transfer to medical hospitalist team from primary management on the floor.  Discussed  with Dr. Karleen Hampshire.  Greater than 50% time during this 35-minute visit was spent on counseling and coordination of care about her stroke and discussion with care team  Antony Contras, MD Medical Director [place] Pager: [phone] 02/23/2019 2:38 PM   To contact Stroke Continuity provider, please refer to [email or web address] After hours, contact General Neurology

## 2019-02-23 NOTE — Evaluation (Signed)
Occupational Therapy Evaluation Patient Details Name: Renee Fritz MRN: [id number] DOB: 1925-12-03 Today's Date: 02/23/2019    History of Present Illness Pt adm with AMS,  CT head revealed a 34 mL right parietofrontal acute ICH.  PMH - ulcerative colitis, HTN, cad, htn, femoral hernia repair   Clinical Impression   Pt was ambulatory with a cane and assisted intermittently for IADL, but living alone. Pt presents with dense L neglect and visual field cut with L side weakness, impaired cognition and decreased balance. She demonstrated ability to self feed with set up and stabilization of ice cream cup and to wash face with moderate assistance. Pt with poor attention, but at times participated in brief, appropriate conversation. Daughter aware that pt will need extensive rehab an [place] SNF in [place] near [place] home with plan for long term placement following rehab. Will follow acutely.    Follow Up Recommendations  SNF;Supervision/Assistance - 24 hour    Equipment Recommendations  Other (comment)(defer to next venue)    Recommendations for Other Services       Precautions / Restrictions Precautions Precautions: Fall;Other (comment) Precaution Comments: lt neglect, field cut Restrictions Weight Bearing Restrictions: No      Mobility Bed Mobility      General bed mobility comments: pt received and returned to chair  Transfers Overall transfer level: Needs assistance Equipment used: 1 person hand held assist Transfers: Sit to/from Stand;Stand Pivot Transfers Sit to Stand: Min assist Stand pivot transfers: Mod assist       General transfer comment: assist to stand and steady, min assist to transfer R, mod to transfer L due to neglect    Balance Overall balance assessment: Needs assistance Sitting-balance support: Bilateral upper extremity supported;Feet supported Sitting balance-Leahy Scale: Fair Sitting balance - Comments: Sat EOB with supervision x 10 minutes    Standing balance support: Single extremity supported Standing balance-Leahy Scale: Poor Standing balance comment: min assist for static standing                           ADL either performed or assessed with clinical judgement   ADL Overall ADL's : Needs assistance/impaired Eating/Feeding: Minimal assistance;Sitting Eating/Feeding Details (indicate cue type and reason): ate ice cream with spoon with assist to stabilize cup, drank with straw and use of R UE Grooming: Wash/dry hands;Sitting;Moderate assistance Grooming Details (indicate cue type and reason): decreased thoroughness, but used wash cloth appropriately Upper Body Bathing: Maximal assistance;Sitting   Lower Body Bathing: Maximal assistance;Sit to/from stand   Upper Body Dressing : Maximal assistance;Sitting   Lower Body Dressing: Maximal assistance;Sit to/from stand   Toilet Transfer: Moderate assistance;Stand-pivot   Toileting- Clothing Manipulation and Hygiene: Maximal assistance;Sit to/from stand               Vision Patient Visual Report: Peripheral vision impairment Vision Assessment?: Yes Alignment/Gaze Preference: Gaze right Visual Fields: Left visual field deficit     Perception Perception Perception Tested?: Yes Perception Deficits: Inattention/neglect(L) Inattention/Neglect: Does not attend to left visual field;Does not attend to left side of body   Praxis      Pertinent Vitals/Pain Pain Assessment: Faces Faces Pain Scale: Hurts even more Pain Location: head and neck Pain Descriptors / Indicators: Aching;Tightness Pain Intervention(s): Patient requesting pain meds-RN notified     Hand Dominance Right   Extremity/Trunk Assessment Upper Extremity Assessment Upper Extremity Assessment: LUE deficits/detail;RUE deficits/detail RUE Deficits / Details: arthritic changes in hand, difficult to formally assess due to cognition,  appears grossly intact LUE Deficits / Details: arthritic  changes in hand, maintains in flexion in lap, no functional use elicited LUE Coordination: decreased fine motor;decreased gross motor   Lower Extremity Assessment Lower Extremity Assessment: Defer to PT evaluation LLE Deficits / Details: functionally weaker than rt. Neglect.  LLE Sensation: decreased light touch       Communication Communication Communication: No difficulties   Cognition Arousal/Alertness: Awake/alert Behavior During Therapy: Flat affect Overall Cognitive Status: Impaired/Different from baseline Area of Impairment: Orientation;Attention;Memory;Following commands;Safety/judgement;Awareness;Problem solving                 Orientation Level: Disoriented to;Time;Situation;Place Current Attention Level: Focused Memory: Decreased short-term memory Following Commands: Follows one step commands inconsistently Safety/Judgement: Decreased awareness of safety;Decreased awareness of deficits Awareness: Intellectual Problem Solving: Requires verbal cues;Requires tactile cues;Difficulty sequencing General Comments: Pt with significant lt neglect. Difficulty attending to any task or question   General Comments       Exercises     Shoulder Instructions      Home Living Family/patient expects to be discharged to:: Private residence Living Arrangements: Alone Available Help at Discharge: Personal care attendant(2 days a week) Type of Home: House Home Access: Stairs to enter   Entrance Stairs-Rails: Right;Left Home Layout: Two level;Bed/bath upstairs Alternate Level Stairs-Number of Steps: 1 flight Alternate Level Stairs-Rails: Right Bathroom Shower/Tub: Teacher, early years/pre: Standard     Home Equipment: Grab bars - tub/shower;Walker - 2 wheels;Cane - single point   Additional Comments: Pt performs self cath independently      Prior Functioning/Environment Level of Independence: Needs assistance  Gait / Transfers Assistance Needed: modified  independent with amb and stairs. Uses cane  ADL's / Homemaking Assistance Needed: assisted for IADL            OT Problem List: Decreased strength;Impaired balance (sitting and/or standing);Decreased activity tolerance;Impaired vision/perception;Decreased coordination;Decreased cognition;Decreased safety awareness;Decreased knowledge of use of DME or AE;Impaired sensation;Impaired UE functional use      OT Treatment/Interventions: Self-care/ADL training;DME and/or AE instruction;Therapeutic activities;Cognitive remediation/compensation;Visual/perceptual remediation/compensation;Patient/family education;Balance training;Neuromuscular education    OT Goals(Current goals can be found in the care plan section) Acute Rehab OT Goals Patient Stated Goal: see her cat OT Goal Formulation: With family Time For Goal Achievement: 03/09/19 Potential to Achieve Goals: Fair ADL Goals Pt Will Perform Eating: sitting;with supervision;with set-up Pt Will Perform Grooming: with min assist;sitting Pt Will Perform Upper Body Dressing: with mod assist;sitting Pt Will Transfer to Toilet: with min assist;ambulating;bedside commode Pt/caregiver will Perform Home Exercise Program: Increased strength;Left upper extremity;With minimal assist Additional ADL Goal #1: Pt will perform bed mobility with min assist in preparation for ADL. Additional ADL Goal #2: Pt will locate ADL items with visual and auditory cues 45 degrees toward L with 50% accuracy.  OT Frequency: Min 3X/week   Barriers to D/C:            Co-evaluation              AM-PAC OT "6 Clicks" Daily Activity     Outcome Measure Help from another person eating meals?: A Little Help from another person taking care of personal grooming?: A Lot Help from another person toileting, which includes using toliet, bedpan, or urinal?: A Lot Help from another person bathing (including washing, rinsing, drying)?: A Lot Help from another person to put on  and taking off regular upper body clothing?: A Lot Help from another person to put on and taking off regular lower body clothing?:  A Lot 6 Click Score: 13   End of Session Equipment Utilized During Treatment: Gait belt  Activity Tolerance: Patient tolerated treatment well Patient left: in chair;with call bell/phone within reach;with chair alarm set  OT Visit Diagnosis: Unsteadiness on feet (R26.81);Other abnormalities of gait and mobility (R26.89);Other symptoms and signs involving cognitive function;Muscle weakness (generalized) (M62.81);Low vision, both eyes (H54.2);Hemiplegia and hemiparesis Hemiplegia - Right/Left: Left Hemiplegia - dominant/non-dominant: Non-Dominant                Time: [id number] OT Time Calculation (min): 30 min Charges:  OT General Charges $OT Visit: 1 Visit OT Evaluation $OT Eval Moderate Complexity: 1 Mod OT Treatments $Self Care/Home Management : 8-22 mins Martie RoundJulie Morty Ortwein, OTR/L Acute Rehabilitation Services Pager: [phone] Office: [phone]  Evern BioMayberry, Khila Papp Lynn 02/23/2019, 1:48 PM

## 2019-02-23 NOTE — Progress Notes (Deleted)
*  PRELIMINARY RESULTS* Echocardiogram 2D Echocardiogram has been performed.  Renee Fritz 02/23/2019, 9:50 AM

## 2019-02-23 NOTE — Progress Notes (Signed)
PT Cancellation Note  Patient Details Name: Renee Fritz MRN: [id number] DOB: [birth year]   Cancelled Treatment:    Reason Eval/Treat Not Completed: Other (comment). Pt with bedrest order. Nursing attempting to contact MD to update activity order.   Shary Decamp [facility] 02/23/2019, 10:15 AM [place] Pager [phone] Office [phone]

## 2019-02-23 NOTE — Progress Notes (Signed)
Assisted tele visit to patient with family.  Ladeidra Borys P, RN  

## 2019-02-24 ENCOUNTER — Encounter (HOSPITAL_COMMUNITY): Payer: Self-pay | Admitting: Internal Medicine

## 2019-02-24 ENCOUNTER — Encounter (HOSPITAL_COMMUNITY): Payer: Self-pay

## 2019-02-24 ENCOUNTER — Inpatient Hospital Stay (HOSPITAL_COMMUNITY)
Admission: RE | Admit: 2019-02-24 | Discharge: 2019-03-03 | DRG: 057 | Disposition: A | Discharge status: Hospice | Payer: Medicare Other | Source: Intra-hospital | Attending: Physical Medicine & Rehabilitation | Admitting: Physical Medicine & Rehabilitation

## 2019-02-24 ENCOUNTER — Other Ambulatory Visit: Payer: Self-pay

## 2019-02-24 DIAGNOSIS — N390 Urinary tract infection, site not specified: Secondary | ICD-10-CM

## 2019-02-24 DIAGNOSIS — S06340A Traumatic hemorrhage of right cerebrum without loss of consciousness, initial encounter: Secondary | ICD-10-CM | POA: Diagnosis not present

## 2019-02-24 DIAGNOSIS — Z515 Encounter for palliative care: Secondary | ICD-10-CM | POA: Diagnosis not present

## 2019-02-24 DIAGNOSIS — R7303 Prediabetes: Secondary | ICD-10-CM | POA: Diagnosis present

## 2019-02-24 DIAGNOSIS — E871 Hypo-osmolality and hyponatremia: Secondary | ICD-10-CM | POA: Diagnosis not present

## 2019-02-24 DIAGNOSIS — Z883 Allergy status to other anti-infective agents status: Secondary | ICD-10-CM | POA: Diagnosis not present

## 2019-02-24 DIAGNOSIS — I69398 Other sequelae of cerebral infarction: Secondary | ICD-10-CM | POA: Diagnosis not present

## 2019-02-24 DIAGNOSIS — G479 Sleep disorder, unspecified: Secondary | ICD-10-CM | POA: Diagnosis present

## 2019-02-24 DIAGNOSIS — E876 Hypokalemia: Secondary | ICD-10-CM | POA: Diagnosis not present

## 2019-02-24 DIAGNOSIS — Z66 Do not resuscitate: Secondary | ICD-10-CM | POA: Diagnosis present

## 2019-02-24 DIAGNOSIS — I69319 Unspecified symptoms and signs involving cognitive functions following cerebral infarction: Secondary | ICD-10-CM | POA: Diagnosis not present

## 2019-02-24 DIAGNOSIS — E785 Hyperlipidemia, unspecified: Secondary | ICD-10-CM | POA: Diagnosis present

## 2019-02-24 DIAGNOSIS — H53469 Homonymous bilateral field defects, unspecified side: Secondary | ICD-10-CM

## 2019-02-24 DIAGNOSIS — R0989 Other specified symptoms and signs involving the circulatory and respiratory systems: Secondary | ICD-10-CM | POA: Diagnosis not present

## 2019-02-24 DIAGNOSIS — S069X0S Unspecified intracranial injury without loss of consciousness, sequela: Secondary | ICD-10-CM

## 2019-02-24 DIAGNOSIS — R269 Unspecified abnormalities of gait and mobility: Secondary | ICD-10-CM | POA: Diagnosis not present

## 2019-02-24 DIAGNOSIS — Z22322 Carrier or suspected carrier of Methicillin resistant Staphylococcus aureus: Secondary | ICD-10-CM

## 2019-02-24 DIAGNOSIS — H510 Palsy (spasm) of conjugate gaze: Secondary | ICD-10-CM | POA: Diagnosis present

## 2019-02-24 DIAGNOSIS — K5100 Ulcerative (chronic) pancolitis without complications: Secondary | ICD-10-CM | POA: Diagnosis present

## 2019-02-24 DIAGNOSIS — R451 Restlessness and agitation: Secondary | ICD-10-CM | POA: Diagnosis present

## 2019-02-24 DIAGNOSIS — N319 Neuromuscular dysfunction of bladder, unspecified: Secondary | ICD-10-CM | POA: Diagnosis present

## 2019-02-24 DIAGNOSIS — I615 Nontraumatic intracerebral hemorrhage, intraventricular: Secondary | ICD-10-CM | POA: Diagnosis not present

## 2019-02-24 DIAGNOSIS — Z888 Allergy status to other drugs, medicaments and biological substances status: Secondary | ICD-10-CM

## 2019-02-24 DIAGNOSIS — R414 Neurologic neglect syndrome: Secondary | ICD-10-CM | POA: Diagnosis present

## 2019-02-24 DIAGNOSIS — I10 Essential (primary) hypertension: Secondary | ICD-10-CM | POA: Diagnosis present

## 2019-02-24 DIAGNOSIS — R339 Retention of urine, unspecified: Secondary | ICD-10-CM | POA: Diagnosis not present

## 2019-02-24 DIAGNOSIS — I69154 Hemiplegia and hemiparesis following nontraumatic intracerebral hemorrhage affecting left non-dominant side: Secondary | ICD-10-CM | POA: Diagnosis present

## 2019-02-24 DIAGNOSIS — I69321 Dysphasia following cerebral infarction: Secondary | ICD-10-CM

## 2019-02-24 DIAGNOSIS — G8194 Hemiplegia, unspecified affecting left nondominant side: Secondary | ICD-10-CM

## 2019-02-24 DIAGNOSIS — M810 Age-related osteoporosis without current pathological fracture: Secondary | ICD-10-CM | POA: Diagnosis present

## 2019-02-24 DIAGNOSIS — K5190 Ulcerative colitis, unspecified, without complications: Secondary | ICD-10-CM

## 2019-02-24 LAB — HEMOGLOBIN A1C
Hgb A1c MFr Bld: 5.6 % (ref 4.8–5.6)
Mean Plasma Glucose: 114.02 mg/dL

## 2019-02-24 LAB — LIPID PANEL
Cholesterol: 106 mg/dL (ref 0–200)
HDL: 36 mg/dL — ABNORMAL LOW (ref >40)
LDL Cholesterol: 56 mg/dL (ref 0–99)
Total CHOL/HDL Ratio: 2.9 RATIO
Triglycerides: 69 mg/dL (ref <150)
VLDL: 14 mg/dL (ref 0–40)

## 2019-02-24 MED ORDER — POTASSIUM CHLORIDE 20 MEQ/15ML (10%) PO SOLN
10.0000 meq | Freq: Two times a day (BID) | ORAL | Status: DC
  Administered 2019-02-24 – 2019-03-01 (×8): 10 meq via ORAL
  Filled 2019-02-24 (×10): qty 15

## 2019-02-24 MED ORDER — ACETAMINOPHEN 325 MG PO TABS
650.0000 mg | ORAL_TABLET | ORAL | Status: DC | PRN
  Administered 2019-02-25 – 2019-03-02 (×3): 650 mg via ORAL
  Filled 2019-02-24 (×3): qty 2

## 2019-02-24 MED ORDER — MESALAMINE 400 MG PO CPDR
800.0000 mg | DELAYED_RELEASE_CAPSULE | Freq: Three times a day (TID) | ORAL | Status: DC
  Administered 2019-02-24 – 2019-03-03 (×13): 800 mg via ORAL
  Filled 2019-02-24 (×21): qty 2

## 2019-02-24 MED ORDER — NORTRIPTYLINE HCL 10 MG PO CAPS
20.0000 mg | ORAL_CAPSULE | Freq: Every day | ORAL | Status: DC
  Administered 2019-02-24: 20 mg via ORAL
  Filled 2019-02-24: qty 2

## 2019-02-24 MED ORDER — PANTOPRAZOLE SODIUM 40 MG PO TBEC: 40.0000 mg | DELAYED_RELEASE_TABLET | Freq: Every day | ORAL | Status: DC

## 2019-02-24 MED ORDER — SENNOSIDES-DOCUSATE SODIUM 8.6-50 MG PO TABS
1.0000 | ORAL_TABLET | Freq: Two times a day (BID) | ORAL | Status: DC
  Administered 2019-02-24 – 2019-03-03 (×10): 1 via ORAL
  Filled 2019-02-24 (×13): qty 1

## 2019-02-24 MED ORDER — AMLODIPINE BESYLATE 5 MG PO TABS
5.0000 mg | ORAL_TABLET | Freq: Every day | ORAL | Status: DC
  Administered 2019-02-26 – 2019-03-03 (×6): 5 mg via ORAL
  Filled 2019-02-24 (×7): qty 1

## 2019-02-24 MED ORDER — ATENOLOL 50 MG PO TABS
100.0000 mg | ORAL_TABLET | Freq: Two times a day (BID) | ORAL | Status: DC
  Administered 2019-02-24 – 2019-02-27 (×4): 100 mg via ORAL
  Filled 2019-02-24 (×6): qty 2

## 2019-02-24 MED ORDER — BUSPIRONE HCL 5 MG PO TABS
5.0000 mg | ORAL_TABLET | Freq: Two times a day (BID) | ORAL | Status: DC
  Administered 2019-02-24 – 2019-03-03 (×10): 5 mg via ORAL
  Filled 2019-02-24 (×14): qty 1

## 2019-02-24 MED ORDER — HYDROCORTISONE ACETATE 25 MG RE SUPP
25.0000 mg | Freq: Two times a day (BID) | RECTAL | Status: DC
  Administered 2019-02-24 – 2019-03-03 (×6): 25 mg via RECTAL
  Filled 2019-02-24 (×11): qty 1

## 2019-02-24 MED ORDER — SORBITOL 70 % SOLN: 30.0000 mL | Freq: Every day | Status: DC | PRN

## 2019-02-24 MED ORDER — MUPIROCIN 2 % EX OINT
1.0000 "application " | TOPICAL_OINTMENT | Freq: Two times a day (BID) | CUTANEOUS | Status: DC
  Administered 2019-02-24: 1 via NASAL
  Filled 2019-02-24: qty 22

## 2019-02-24 MED ORDER — HYDROCHLOROTHIAZIDE 25 MG PO TABS
25.0000 mg | ORAL_TABLET | Freq: Every day | ORAL | Status: DC
  Administered 2019-02-26 – 2019-03-01 (×4): 25 mg via ORAL
  Filled 2019-02-24 (×5): qty 1

## 2019-02-24 MED ORDER — PANTOPRAZOLE SODIUM 40 MG PO TBEC
40.0000 mg | DELAYED_RELEASE_TABLET | Freq: Every day | ORAL | Status: DC
  Administered 2019-02-24 – 2019-02-28 (×2): 40 mg via ORAL
  Filled 2019-02-24 (×5): qty 1

## 2019-02-24 MED ORDER — ACETAMINOPHEN 160 MG/5ML PO SOLN: 650.0000 mg | ORAL | Status: DC | PRN

## 2019-02-24 MED ORDER — ACETAMINOPHEN 650 MG RE SUPP: 650.0000 mg | RECTAL | Status: DC | PRN

## 2019-02-24 NOTE — H&P (Signed)
Physical Medicine and Rehabilitation Admission H&P        Chief Complaint  Patient presents with   Altered Mental Status  Chief complaint: Weakness HPI: Renee Fritz is a 85 year old right-handed female with history of hypertension, ulcerative colitis, neurogenic bladder 2007 after a fall followed by urology services [facility].  Per chart review she lives alone in [place].  She has a home health aide 2 days a week.  Local family check on her as needed.  She performs self caths independently with noted history of urinary ostomy.  Presented 02/21/2019 with altered mental status.  Son had reported she recently stumbled and struck her left forehead into the wall.  Cranial CT scan showed acute right parietal hemorrhage 41 x 33 x 46 mm with mild surrounding edema but no right to left shift or intraventricular extension.  Slight subarachnoid blood.  CT cervical spine negative.  CT angiogram of the head with no large vessel occlusion.  Follow-up MRI again shows right parietal hemorrhage not increased in size from prior tracings.  No visible midline shift.  Neurology follow-up with conservative care.  Tolerating a regular diet.  Therapy evaluations completed and patient was admitted for a comprehensive rehab program.   Review of Systems  Constitutional: Negative for chills and fever.  HENT: Positive for hearing loss.   Eyes: Negative for blurred vision and double vision.  Respiratory: Negative for cough and shortness of breath.   Cardiovascular: Negative for chest pain, palpitations and leg swelling.  Gastrointestinal: Positive for constipation. Negative for heartburn, nausea and vomiting.  Genitourinary: Negative for dysuria, flank pain and hematuria.  Musculoskeletal: Positive for joint pain and myalgias.       Reports of recent fall when she struck her forehead into a wall  Skin: Negative for rash.  Psychiatric/Behavioral: Positive for depression.  The patient has insomnia.   All other systems reviewed and are negative.       Past Medical History:  Diagnosis Date   Hypertension     Incarcerated femoral hernia 07/03/2017   Osteoporosis     Other specified cardiac arrhythmias     Pneumonia     Ulcerative (chronic) enterocolitis [facility](HCC)          Past Surgical History:  Procedure Laterality Date   FEMORAL HERNIA REPAIR Left 07/03/2017    Procedure: HERNIA REPAIR FEMORAL;  Surgeon: Abigail MiyamotoBlackman, Douglas, MD;  [facility];  Service: General;  Laterality: Left;   urinary ostomy       Family History  Family history unknown: Yes   Social History:  reports that she has never smoked. She has never used smokeless tobacco. She reports that she does not drink alcohol or use drugs. Allergies:  Allergies  Allergen Reactions   Estrace [Estradiol] Other (See Comments)      burning   Pepcid [Famotidine] Other (See Comments)      confusion   Phenergan [Promethazine Hcl] Other (See Comments)      Feels like bugs crawling all over   Tramadol     Vancomycin Diarrhea and Other (See Comments)      Stomach pains   Erythromycin-Sulfisoxazole Rash   Sulfa Antibiotics Rash         Medications Prior to Admission  Medication Sig Dispense Refill   amLODipine (NORVASC) 5 MG tablet Take 5 mg by mouth daily.       atenolol (TENORMIN) 100 MG tablet Take 100 mg by mouth 2 (  two) times daily.        B-Complex-C-Biotin-Minerals-FA (FOLBEE PLUS CZ) 5 MG TABS Take 1 tablet by mouth daily.       busPIRone (BUSPAR) 5 MG tablet Take 5 mg by mouth 2 (two) times daily as needed (anxiety).        Calcium-Magnesium-Vitamin D (CALCIUM 1200+D3 PO) Take 1 tablet by mouth daily.       cephALEXin (KEFLEX) 500 MG capsule Take 500 mg by mouth daily.       cetirizine (ZYRTEC) 10 MG tablet Take 10 mg by mouth daily as needed for allergies.       Coenzyme Q10 (COQ10) 30 MG CAPS Take 30 mg by mouth daily.       denosumab (PROLIA) 60 MG/ML SOSY  injection Inject 60 mg into the skin every 6 (six) months.       ezetimibe-simvastatin (VYTORIN) 10-20 MG tablet Take 1 tablet by mouth daily.       hydrocortisone (ANUSOL-HC) 25 MG suppository Place 25 mg rectally 2 (two) times daily as needed for hemorrhoids or anal itching.        Mesalamine (ASACOL HD) 800 MG TBEC Take 800 mg by mouth 3 (three) times daily.       Omega-3 1000 MG CAPS Take 1 tablet by mouth daily.       potassium chloride (K-DUR,KLOR-CON) 10 MEQ tablet Take 10 mEq by mouth 2 (two) times daily.       psyllium (METAMUCIL) 58.6 % packet Take 1 packet by mouth daily.       RABEprazole (ACIPHEX) 20 MG tablet Take 20 mg by mouth daily.        ciprofloxacin (CIPRO) 100 MG tablet Take 100 mg by mouth 2 (two) times daily.       hydrochlorothiazide (HYDRODIURIL) 25 MG tablet Take 25 mg by mouth daily.       metroNIDAZOLE (FLAGYL) 500 MG tablet Take 500 mg by mouth 3 (three) times daily.       nortriptyline (PAMELOR) 10 MG capsule Take 20 mg by mouth at bedtime.       ondansetron (ZOFRAN-ODT) 8 MG disintegrating tablet Take 8 mg by mouth every 8 (eight) hours as needed for nausea or vomiting.         Drug Regimen Review  Drug regimen was reviewed and remains appropriate with no significant issues identified   Home: Home Living Family/patient expects to be discharged to:: Private residence Living Arrangements: Alone Available Help at Discharge: Personal care attendant(2 days a week) Type of Home: House Home Access: Stairs to enter Entrance Stairs-Rails: Right, Left Home Layout: Two level, Bed/bath upstairs Alternate Level Stairs-Number of Steps: 1 flight Alternate Level Stairs-Rails: Right Bathroom Shower/Tub: Engineer, manufacturing systems: Standard Home Equipment: Grab bars - tub/shower, Environmental consultant - 2 wheels, Cane - single point Additional Comments: Pt performs self cath independently   Functional History: Prior Function Level of Independence: Needs  assistance Gait / Transfers Assistance Needed: modified independent with amb and stairs. Uses cane  ADL's / Homemaking Assistance Needed: assisted for IADL   Functional Status:  Mobility: Bed Mobility Overal bed mobility: Needs Assistance Bed Mobility: Supine to Sit Supine to sit: Mod assist, Max assist General bed mobility comments: required assist to guide LE to EOB as well as trunk control to come into sitting Transfers Overall transfer level: Needs assistance Equipment used: Rolling walker (2 wheeled) Transfers: Sit to/from Stand, Stand Pivot Transfers Sit to Stand: Min assist Stand pivot transfers: Mod assist General transfer  comment: assist to stand and steady, min assist to transfer R, mod to transfer L due to neglect Ambulation/Gait Ambulation/Gait assistance: Mod assist Gait Distance (Feet): 80 Feet Assistive device: Rolling walker (2 wheeled) Gait Pattern/deviations: Step-to pattern, Decreased stride length, Drifts right/left, Trunk flexed General Gait Details: unable to navigate RW without significant assist from PT - tendency to turn L throughout mobility; does not scan environment; up to Mod A for steadying Gait velocity: decr Gait velocity interpretation: <1.31 ft/sec, indicative of household ambulator     ADL: ADL Overall ADL's : Needs assistance/impaired Eating/Feeding: Minimal assistance, Sitting Eating/Feeding Details (indicate cue type and reason): ate ice cream with spoon with assist to stabilize cup, drank with straw and use of R UE Grooming: Wash/dry hands, Sitting, Moderate assistance Grooming Details (indicate cue type and reason): decreased thoroughness, but used wash cloth appropriately Upper Body Bathing: Maximal assistance, Sitting Lower Body Bathing: Maximal assistance, Sit to/from stand Upper Body Dressing : Maximal assistance, Sitting Lower Body Dressing: Maximal assistance, Sit to/from stand Toilet Transfer: Moderate assistance,  Stand-pivot Toileting- Clothing Manipulation and Hygiene: Maximal assistance, Sit to/from stand   Cognition: Cognition Overall Cognitive Status: Impaired/Different from baseline Arousal/Alertness: Awake/alert Orientation Level: Oriented to person, Oriented to place, Disoriented to time, Disoriented to situation Attention: Focused, Sustained, Selective, Alternating Focused Attention: Appears intact Sustained Attention: Impaired Sustained Attention Impairment: Verbal basic, Functional basic Selective Attention: Impaired Selective Attention Impairment: Verbal basic, Functional basic Alternating Attention: Impaired Alternating Attention Impairment: Verbal basic, Functional basic Memory: Impaired Memory Impairment: Storage deficit Awareness: Impaired Awareness Impairment: Intellectual impairment, Emergent impairment(better anticipatory awareness than other ) Problem Solving: Impaired Problem Solving Impairment: Functional basic Executive Function: Self Monitoring Self Monitoring: Impaired Self Monitoring Impairment: Verbal basic Safety/Judgment: Impaired Cognition Arousal/Alertness: Awake/alert Behavior During Therapy: Flat affect Overall Cognitive Status: Impaired/Different from baseline Area of Impairment: Orientation, Attention, Memory, Following commands, Safety/judgement, Awareness, Problem solving Orientation Level: Disoriented to, Time, Situation, Place Current Attention Level: Focused Memory: Decreased short-term memory Following Commands: Follows one step commands inconsistently, Follows one step commands with increased time Safety/Judgement: Decreased awareness of safety, Decreased awareness of deficits Awareness: Intellectual Problem Solving: Requires verbal cues, Requires tactile cues, Difficulty sequencing, Slow processing General Comments: Pt with significant lt neglect. Difficulty attending to any task or question   Physical Exam: Blood pressure 128/65, pulse 72,  temperature 98 F (36.7 C), temperature source Axillary, resp. rate 20, height 5' (1.524 m), weight 41.8 kg, SpO2 95 %. Physical Exam  Constitutional: No distress.  restless  HENT:  Head: Normocephalic and atraumatic.  Eyes: Pupils are equal, round, and reactive to light. Conjunctivae are normal.  Neck: Normal range of motion. No thyromegaly present.  Cardiovascular: Normal rate and regular rhythm. Exam reveals no friction rub.  No murmur heard. Respiratory: Effort normal. No respiratory distress. She has no wheezes. She has no rales.  GI: Soft. She exhibits no distension. There is no abdominal tenderness.  Musculoskeletal:        General: No edema.  Neurological:  Pt is alert. Right gaze preference. HOH L>R. Can be cued to attend briefly to the left. Poor attention/concentration. Speech garbled and often intelligible. Once able to perform task can perseverate. RUE and RLE 4/5. LUE 2+/5. LLE 3/5. Senses pain right > left.   Skin: She is not diaphoretic.  Scattered bruises and ecchymoses on arms and legs.   Psychiatric:  Restless and distracted      Lab Results Last 48 Hours  Results for orders placed or performed during the  hospital encounter of 02/21/19 (from the past 48 hour(s))  Glucose, capillary     Status: None    Collection Time: 02/23/19  7:40 AM  Result Value Ref Range    Glucose-Capillary 85 70 - 99 mg/dL  Hemoglobin [id number]     Status: None    Collection Time: 02/24/19  7:49 AM  Result Value Ref Range    Hgb A1c MFr Bld 5.6 4.8 - 5.6 %      Comment: (NOTE) Pre diabetes:          5.7%-6.4% Diabetes:              >6.4% Glycemic control for   <7.0% adults with diabetes      Mean Plasma Glucose 114.02 mg/dL      Comment: Performed at [facility], [address] [place], [place] [address]  Lipid panel     Status: Abnormal    Collection Time: 02/24/19  7:49 AM  Result Value Ref Range    Cholesterol 106 0 - 200 mg/dL    Triglycerides 69 <540<150 mg/dL    HDL 36 (L)  >98>40 mg/dL    Total CHOL/HDL Ratio 2.9 RATIO    VLDL 14 0 - 40 mg/dL    LDL Cholesterol 56 0 - 99 mg/dL      Comment:        Total Cholesterol/HDL:CHD Risk Coronary Heart Disease Risk Table                     Men   Women  1/2 Average Risk   3.4   3.3  Average Risk       5.0   4.4  2 X Average Risk   9.6   7.1  3 X Average Risk  23.4   11.0        Use the calculated Patient Ratio above and the CHD Risk Table to determine the patient's CHD Risk.        ATP III CLASSIFICATION (LDL):  <100     mg/dL   Optimal  [id number]  mg/dL   Near or Above                    Optimal  130-159  mg/dL   Borderline  [id number]  mg/dL   High  >130>190     mg/dL   Very High Performed at [facility], [address] [place], [place] [address]        Imaging Results (Last 48 hours)  Mr Laqueta JeanBrain W Wo Contrast   Result Date: 02/22/2019 CLINICAL DATA:  Intracranial hemorrhage.  Continued surveillance. EXAM: MRI HEAD WITHOUT AND WITH CONTRAST TECHNIQUE: Multiplanar, multiecho pulse sequences of the brain and surrounding structures were obtained without and with intravenous contrast. CONTRAST:  Gadavist 8 mL COMPARISON:  CT head 02/21/2019 FINDINGS: There is significant motion degradation. The study is of marginal diagnostic utility. Brain: Large RIGHT parietal hemorrhage redemonstrated, not clearly increased in size from priors. Surrounding edema. Peripheral restriction, likely susceptibility related although medial RIGHT parietal gyral restriction could represent acute ischemia; see series 3, image 39. Punctate focus of low level restriction RIGHT occipital lobe. Generalized atrophy. Extensive T2 and FLAIR hyperintensities in the white matter, likely small vessel disease. No definable abnormal postcontrast enhancement, when correlated with precontrast T1 weighted images, see for instance precontrast series 9, image 60 compared with postcontrast series 11, image 34. Vascular: Unable to assess.  Grossly negative. Skull  and upper  cervical spine: Grossly negative. Sinuses/Orbits: Grossly negative. Other: Grossly negative. IMPRESSION: Motion degraded study of marginal diagnostic utility demonstrates a large RIGHT parietal hemorrhage, not clearly increased in size from priors. No visible midline shift. Abnormal signal on diffusion-weighted imaging is largely related to susceptibility artifact, but there may be gyriform like acute infarction of the medial RIGHT parietal lobe, as well as a punctate focus of acute infarction in the RIGHT occipital lobe. See discussion above. There is no visible abnormal postcontrast enhancement to suggest underlying mass, when axial precontrast T1 weighted images are compared with those postcontrast. Consider repeat MRI brain in 6 weeks, when there has been partial involution of the hemorrhage, as well as hopefully improvement in the clinical status so that the patient might remain motionless. Electronically Signed   By: Elsie Stain M.D.   On: 02/22/2019 17:43            Medical Problem List and Plan: 1.  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  Antithrombotics: -DVT/anticoagulation: SCDs             -antiplatelet therapy: N/A 3. Pain Management: Tylenol as needed 4. Mood: BuSpar 5 mg twice daily, Pamelor 20 mg nightly             -antipsychotic agents: pt with difficulty sleeping at night with increased confusion and restlessness per staff/family. Begin low dose seroquel 12.5mg  qhs                         -keep sleep chart                         -try to re-establish normal sleep-wake patterns.                         -pt has decompensated in hospital b/f per family  5. Neuropsych: This patient is capable of making decisions on her own behalf. 6. Skin/Wound Care: Routine skin checks 7. Fluids/Electrolytes/Nutrition: Routine in and outs with follow-up chemistries 8.   Hypertension.  HCTZ 25 mg daily, Tenormin 100 mg twice daily, Norvasc 5 mg daily.  Monitor with increased mobility 9.  Neurogenic bladder secondary to a fall 2007.  Independent self caths.  Followed by Dr. Sabino Gasser [facility] urology             -will do I/O caths per RN until she improves neurologically 10.  Ulcerative colitis.  Mesalamine 800 mg 3 times daily 11.  Hyperlipidemia.  Vytorin currently on hold secondary to intracerebral hemorrhage      Post Admission Physician Evaluation: 1. Functional deficits secondary  to right parietal ICH. 2. Patient is admitted to receive collaborative, interdisciplinary care between the physiatrist, rehab nursing staff, and therapy team. 3. Patient's level of medical complexity and substantial therapy needs in context of that medical necessity cannot be provided at a lesser intensity of care such as a SNF. 4. Patient has experienced substantial functional loss from his/her baseline which was documented above under the "Functional History" and "Functional Status" headings.  Judging by the patient's diagnosis, physical exam, and functional history, the patient has potential for functional progress which will result in measurable gains while on inpatient rehab.  These gains will be of substantial and practical use upon discharge  in facilitating mobility  and self-care at the household level. 5. Physiatrist will provide 24 hour management of medical needs as well as oversight of the therapy plan/treatment and provide guidance as appropriate regarding the interaction of the two. 6. The Preadmission Screening has been reviewed and patient status is unchanged unless otherwise stated above. 7. 24 hour rehab nursing will assist with bladder management, bowel management, safety, skin/wound care, disease management, medication administration, pain management and patient education  and help integrate therapy concepts, techniques,education, etc. 8. PT will assess and treat  for/with: Lower extremity strength, range of motion, stamina, balance, functional mobility, safety, adaptive techniques and equipment , NMR, family ed.   Goals are: supervision to min assist. 9. OT will assess and treat for/with: ADL's, functional mobility, safety, upper extremity strength, adaptive techniques and equipment, NMR, family .   Goals are: min assist to supervision. Therapy may proceed with showering this patient. 10. SLP will assess and treat for/with: cognition communication, family ed.  Goals are: supervision to min assist. 11. Case Management and Social Worker will assess and treat for psychological issues and discharge planning. 12. Team conference will be held weekly to assess progress toward goals and to determine barriers to discharge. 13. Patient will receive at least 3 hours of therapy per day at least 5 days per week. 14. ELOS: 18-25 days       15. Prognosis:  excellent   I have personally performed a face to face diagnostic evaluation of this patient and formulated the key components of the plan.  Additionally, I have personally reviewed laboratory data, imaging studies, as well as relevant notes and concur with the physician assistant's documentation above.  Ranelle OysterZachary T. Jacub Waiters, MD, FAAPMR     Mcarthur Rossettianiel J Angiulli, PA-C 02/24/2019

## 2019-02-24 NOTE — Discharge Instructions (Signed)
Hemiparesis  Hemiparesis is weakness on one side of the body, such as one arm and one leg. Hemiparesis often happens after a stroke. The weakness is usually on the side of the body that is opposite from the side of the brain that was affected by the stroke. For example, a stroke in the left side of the brain may cause hemiparesis on the right side of the body. Hemiparesis can make it hard for you to do normal daily tasks, so you may need assistive devices. Therapy can help you maintain the strength and function of the affected side of your body. What are the causes? This condition may be caused by:  Stroke.  Brain injury (trauma).  Brain tumor.  A type of dementia called mitochondrial encephalomyopathy, lactic acidosis, and stroke-like episodes (MELAS). What are the signs or symptoms? Symptoms of this condition include:  Weakness on one side of the body.  Trouble doing daily activities such as dressing or bathing.  Loss of muscle strength on one side of the body. This may affect the arm, leg, or face.  Loss of balance.  Trouble walking.  Problems grabbing things or holding them.  Problems with coordination.  Pusher syndrome. This means you push toward the weaker side of your body and often lose your balance. How is this diagnosed? This condition may be diagnosed based on:  A physical exam.  Your medical history.  Imaging tests, such as CT scan or MRI. How is this treated? Treatment for this condition includes:  Physical therapy.  Occupational therapy. This therapy helps you to do basic daily activities.  Exercises done in a pool (hydrotherapy).  Cortical stimulation therapy. This involves stimulating your brain with electrical currents to make it work better.  Modified constraint-induced movement therapy (mCIMT). This therapy restricts one side of your body so that you have to use the weaker side.  Electrical stimulation. This involves stimulating the muscles on  the weaker side of your body with small electrical pads.  Medicines that help stiffness and discomfort.  Using devices to help you move around (assistive devices), such as canes, walkers, or wheelchairs. Follow these instructions at home: Safety   Use assistive devices as instructed. Your risk of falling is higher because of hemiparesis.  Make changes in your home to lower your risk of falls. These may include: ? Removing rugs and mats from the floor. ? Removing clutter. ? Adding rails or grab bars in bathrooms. Activity  Do not drive unless your health care provider approves.  Do not drive or use heavy machinery while taking medicine that alters your alertness or reaction time.  Return to your normal activities as told by your health care provider. Ask your health care provider what activities are safe for you.  Ask your health care provider about getting extra help at home. You may have problems doing your normal activities, such as dressing, bathing, using the bathroom, and eating. Lifestyle  Do not use any products that contain nicotine or tobacco, such as cigarettes and e-cigarettes. If you need help quitting, ask your health care provider.  Avoid drinking alcohol. General instructions  Take over-the-counter and prescription medicines only as told by your health care provider.  Wear clothes and shoes that are easy to put on and take off. Try to avoid zippers and buttons.  Keep all follow-up visits as told by your health care provider. This is important. Contact a health care provider if:  Your symptoms get worse and medicines do not help.  You have severe pain. Get help right away if:  You have jerky movements that you cannot control (seizure).  You have trouble breathing.  You have chest pain. Summary  Hemiparesis is weakness on one side of the body, such as one arm and one leg. This often happens after a stroke or brain injury.  Treatment usually includes  physical and occupational therapy. Occupational therapy can help you with everyday tasks that may be difficult, such as dressing, using the bathroom, and eating.  Your risk of falling is higher because of hemiparesis. You may need to use a wheelchair, walker, or another assistive device. Make changes in your home to prevent falls, such as removing clutter and installing grab bars. This information is not intended to replace advice given to you by your health care provider. Make sure you discuss any questions you have with your health care provider. Document Released: 12/05/2016 Document Revised: 12/05/2016 Document Reviewed: 12/05/2016 Elsevier Interactive Patient Education  2019 [employer].

## 2019-02-24 NOTE — H&P (Signed)
Physical Medicine and Rehabilitation Admission H&P    Chief Complaint  Patient presents with  . Altered Mental Status  Chief complaint: Weakness HPI: Renee Fritz is a 85 year old right-handed female with history of hypertension, ulcerative colitis, neurogenic bladder 2007 after a fall followed [facility]rology [facility].  Per chart review she lives alone in [place].  She has a home health aide 2 days a week.  Local family check on her as needed.  She performs self caths independently with noted history of urinary ostomy.  Presented 02/21/2019 with altered mental status.  Son had reported she recently stumbled and struck her left forehead into the wall.  Cranial CT scan showed acute right parietal hemorrhage 41 x 33 x 46 mm with mild surrounding edema but no right to left shift or intraventricular extension.  Slight subarachnoid blood.  CT cervical spine negative.  CT angiogram of the head with no large vessel occlusion.  Follow-up MRI again shows right parietal hemorrhage not increased in size from prior tracings.  No visible midline shift.  Neurology follow-up with conservative care.  Tolerating a regular diet.  Therapy evaluations completed and patient was admitted for a comprehensive rehab program.  Review of Systems  Constitutional: Negative for chills and fever.  HENT: Positive for hearing loss.   Eyes: Negative for blurred vision and double vision.  Respiratory: Negative for cough and shortness of breath.   Cardiovascular: Negative for chest pain, palpitations and leg swelling.  Gastrointestinal: Positive for constipation. Negative for heartburn, nausea and vomiting.  Genitourinary: Negative for dysuria, flank pain and hematuria.  Musculoskeletal: Positive for joint pain and myalgias.       Reports of recent fall when she struck her forehead into a wall  Skin: Negative for rash.  Psychiatric/Behavioral: Positive for depression. The  patient has insomnia.   All other systems reviewed and are negative.  Past Medical History:  Diagnosis Date  . Hypertension   . Incarcerated femoral hernia 07/03/2017  . Osteoporosis   . Other specified cardiac arrhythmias   . Pneumonia   . Ulcerative (chronic) enterocolitis (HCC)    Past Surgical History:  Procedure Laterality Date  . FEMORAL HERNIA REPAIR Left 07/03/2017   Procedure: HERNIA REPAIR FEMORAL;  Surgeon: Abigail MiyamotoBlackman, Douglas, MD;  [facility];  Service: General;  Laterality: Left;  . urinary ostomy     Family History  Family history unknown: Yes   Social History:  reports that she has never smoked. She has never used smokeless tobacco. She reports that she does not drink alcohol or use drugs. Allergies:  Allergies  Allergen Reactions  . Estrace [Estradiol] Other (See Comments)    burning  . Pepcid [Famotidine] Other (See Comments)    confusion  . Phenergan [Promethazine Hcl] Other (See Comments)    Feels like bugs crawling all over  . Tramadol   . Vancomycin Diarrhea and Other (See Comments)    Stomach pains  . Erythromycin-Sulfisoxazole Rash  . Sulfa Antibiotics Rash   Medications Prior to Admission  Medication Sig Dispense Refill  . amLODipine (NORVASC) 5 MG tablet Take 5 mg by mouth daily.    Marland Kitchen. atenolol (TENORMIN) 100 MG tablet Take 100 mg by mouth 2 (two) times daily.     Marland Kitchen. B-Complex-C-Biotin-Minerals-FA (FOLBEE PLUS CZ) 5 MG TABS Take 1 tablet by mouth daily.    . busPIRone (BUSPAR) 5 MG tablet Take 5 mg by mouth 2 (two) times daily as needed (anxiety).     .Marland Kitchen  Calcium-Magnesium-Vitamin D (CALCIUM 1200+D3 PO) Take 1 tablet by mouth daily.    . cephALEXin (KEFLEX) 500 MG capsule Take 500 mg by mouth daily.    . cetirizine (ZYRTEC) 10 MG tablet Take 10 mg by mouth daily as needed for allergies.    . Coenzyme Q10 (COQ10) 30 MG CAPS Take 30 mg by mouth daily.    Marland Kitchen denosumab (PROLIA) 60 MG/ML SOSY injection Inject 60 mg into the skin every 6 (six) months.     . ezetimibe-simvastatin (VYTORIN) 10-20 MG tablet Take 1 tablet by mouth daily.    . hydrocortisone (ANUSOL-HC) 25 MG suppository Place 25 mg rectally 2 (two) times daily as needed for hemorrhoids or anal itching.     . Mesalamine (ASACOL HD) 800 MG TBEC Take 800 mg by mouth 3 (three) times daily.    . Omega-3 1000 MG CAPS Take 1 tablet by mouth daily.    . potassium chloride (K-DUR,KLOR-CON) 10 MEQ tablet Take 10 mEq by mouth 2 (two) times daily.    . psyllium (METAMUCIL) 58.6 % packet Take 1 packet by mouth daily.    . RABEprazole (ACIPHEX) 20 MG tablet Take 20 mg by mouth daily.     . ciprofloxacin (CIPRO) 100 MG tablet Take 100 mg by mouth 2 (two) times daily.    . hydrochlorothiazide (HYDRODIURIL) 25 MG tablet Take 25 mg by mouth daily.    . metroNIDAZOLE (FLAGYL) 500 MG tablet Take 500 mg by mouth 3 (three) times daily.    . nortriptyline (PAMELOR) 10 MG capsule Take 20 mg by mouth at bedtime.    . ondansetron (ZOFRAN-ODT) 8 MG disintegrating tablet Take 8 mg by mouth every 8 (eight) hours as needed for nausea or vomiting.      Drug Regimen Review  Drug regimen was reviewed and remains appropriate with no significant issues identified  Home: Home Living Family/patient expects to be discharged to:: Private residence Living Arrangements: Alone Available Help at Discharge: Personal care attendant(2 days a week) Type of Home: House Home Access: Stairs to enter Entrance Stairs-Rails: Right, Left Home Layout: Two level, Bed/bath upstairs Alternate Level Stairs-Number of Steps: 1 flight Alternate Level Stairs-Rails: Right Bathroom Shower/Tub: Engineer, manufacturing systems: Standard Home Equipment: Grab bars - tub/shower, Environmental consultant - 2 wheels, Cane - single point Additional Comments: Pt performs self cath independently   Functional History: Prior Function Level of Independence: Needs assistance Gait / Transfers Assistance Needed: modified independent with amb and stairs. Uses cane   ADL's / Homemaking Assistance Needed: assisted for IADL  Functional Status:  Mobility: Bed Mobility Overal bed mobility: Needs Assistance Bed Mobility: Supine to Sit Supine to sit: Mod assist, Max assist General bed mobility comments: required assist to guide LE to EOB as well as trunk control to come into sitting Transfers Overall transfer level: Needs assistance Equipment used: Rolling walker (2 wheeled) Transfers: Sit to/from Stand, Stand Pivot Transfers Sit to Stand: Min assist Stand pivot transfers: Mod assist General transfer comment: assist to stand and steady, min assist to transfer R, mod to transfer L due to neglect Ambulation/Gait Ambulation/Gait assistance: Mod assist Gait Distance (Feet): 80 Feet Assistive device: Rolling walker (2 wheeled) Gait Pattern/deviations: Step-to pattern, Decreased stride length, Drifts right/left, Trunk flexed General Gait Details: unable to navigate RW without significant assist from PT - tendency to turn L throughout mobility; does not scan environment; up to Mod A for steadying Gait velocity: decr Gait velocity interpretation: <1.31 ft/sec, indicative of household ambulator    ADL:  ADL Overall ADL's : Needs assistance/impaired Eating/Feeding: Minimal assistance, Sitting Eating/Feeding Details (indicate cue type and reason): ate ice cream with spoon with assist to stabilize cup, drank with straw and use of R UE Grooming: Wash/dry hands, Sitting, Moderate assistance Grooming Details (indicate cue type and reason): decreased thoroughness, but used wash cloth appropriately Upper Body Bathing: Maximal assistance, Sitting Lower Body Bathing: Maximal assistance, Sit to/from stand Upper Body Dressing : Maximal assistance, Sitting Lower Body Dressing: Maximal assistance, Sit to/from stand Toilet Transfer: Moderate assistance, Stand-pivot Toileting- Clothing Manipulation and Hygiene: Maximal assistance, Sit to/from stand  Cognition:  Cognition Overall Cognitive Status: Impaired/Different from baseline Arousal/Alertness: Awake/alert Orientation Level: Oriented to person, Oriented to place, Disoriented to time, Disoriented to situation Attention: Focused, Sustained, Selective, Alternating Focused Attention: Appears intact Sustained Attention: Impaired Sustained Attention Impairment: Verbal basic, Functional basic Selective Attention: Impaired Selective Attention Impairment: Verbal basic, Functional basic Alternating Attention: Impaired Alternating Attention Impairment: Verbal basic, Functional basic Memory: Impaired Memory Impairment: Storage deficit Awareness: Impaired Awareness Impairment: Intellectual impairment, Emergent impairment(better anticipatory awareness than other ) Problem Solving: Impaired Problem Solving Impairment: Functional basic Executive Function: Self Monitoring Self Monitoring: Impaired Self Monitoring Impairment: Verbal basic Safety/Judgment: Impaired Cognition Arousal/Alertness: Awake/alert Behavior During Therapy: Flat affect Overall Cognitive Status: Impaired/Different from baseline Area of Impairment: Orientation, Attention, Memory, Following commands, Safety/judgement, Awareness, Problem solving Orientation Level: Disoriented to, Time, Situation, Place Current Attention Level: Focused Memory: Decreased short-term memory Following Commands: Follows one step commands inconsistently, Follows one step commands with increased time Safety/Judgement: Decreased awareness of safety, Decreased awareness of deficits Awareness: Intellectual Problem Solving: Requires verbal cues, Requires tactile cues, Difficulty sequencing, Slow processing General Comments: Pt with significant lt neglect. Difficulty attending to any task or question  Physical Exam: Blood pressure 128/65, pulse 72, temperature 98 F (36.7 C), temperature source Axillary, resp. rate 20, height 5' (1.524 m), weight 41.8 kg, SpO2  95 %. Physical Exam  Constitutional: No distress.  restless  HENT:  Head: Normocephalic and atraumatic.  Eyes: Pupils are equal, round, and reactive to light. Conjunctivae are normal.  Neck: Normal range of motion. No thyromegaly present.  Cardiovascular: Normal rate and regular rhythm. Exam reveals no friction rub.  No murmur heard. Respiratory: Effort normal. No respiratory distress. She has no wheezes. She has no rales.  GI: Soft. She exhibits no distension. There is no abdominal tenderness.  Musculoskeletal:        General: No edema.  Neurological:  Pt is alert. Right gaze preference. HOH L>R. Can be cued to attend briefly to the left. Poor attention/concentration. Speech garbled and often intelligible. Once able to perform task can perseverate. RUE and RLE 4/5. LUE 2+/5. LLE 3/5. Senses pain right > left.   Skin: She is not diaphoretic.  Scattered bruises and ecchymoses on arms and legs.   Psychiatric:  Restless and distracted    Results for orders placed or performed during the hospital encounter of 02/21/19 (from the past 48 hour(s))  Glucose, capillary     Status: None   Collection Time: 02/23/19  7:40 AM  Result Value Ref Range   Glucose-Capillary 85 70 - 99 mg/dL  Hemoglobin A1c     Status: None   Collection Time: 02/24/19  7:49 AM  Result Value Ref Range   Hgb A1c MFr Bld 5.6 4.8 - 5.6 %    Comment: (NOTE) Pre diabetes:          5.7%-6.4% Diabetes:              >  6.4% Glycemic control for   <7.0% adults with diabetes    Mean Plasma Glucose 114.02 mg/dL    Comment: Performed at [facility], [address] [place], [place] [address]  Lipid panel     Status: Abnormal   Collection Time: 02/24/19  7:49 AM  Result Value Ref Range   Cholesterol 106 0 - 200 mg/dL   Triglycerides 69 <956<150 mg/dL   HDL 36 (L) >21>40 mg/dL   Total CHOL/HDL Ratio 2.9 RATIO   VLDL 14 0 - 40 mg/dL   LDL Cholesterol 56 0 - 99 mg/dL    Comment:        Total Cholesterol/HDL:CHD Risk  Coronary Heart Disease Risk Table                     Men   Women  1/2 Average Risk   3.4   3.3  Average Risk       5.0   4.4  2 X Average Risk   9.6   7.1  3 X Average Risk  23.4   11.0        Use the calculated Patient Ratio above and the CHD Risk Table to determine the patient's CHD Risk.        ATP III CLASSIFICATION (LDL):  <100     mg/dL   Optimal  [id number]  mg/dL   Near or Above                    Optimal  130-159  mg/dL   Borderline  [id number]  mg/dL   High  >952>190     mg/dL   Very High Performed at [facility], [address] [place], [place] [address]    Mr Laqueta JeanBrain W GMWo Contrast  Result Date: 02/22/2019 CLINICAL DATA:  Intracranial hemorrhage.  Continued surveillance. EXAM: MRI HEAD WITHOUT AND WITH CONTRAST TECHNIQUE: Multiplanar, multiecho pulse sequences of the brain and surrounding structures were obtained without and with intravenous contrast. CONTRAST:  Gadavist 8 mL COMPARISON:  CT head 02/21/2019 FINDINGS: There is significant motion degradation. The study is of marginal diagnostic utility. Brain: Large RIGHT parietal hemorrhage redemonstrated, not clearly increased in size from priors. Surrounding edema. Peripheral restriction, likely susceptibility related although medial RIGHT parietal gyral restriction could represent acute ischemia; see series 3, image 39. Punctate focus of low level restriction RIGHT occipital lobe. Generalized atrophy. Extensive T2 and FLAIR hyperintensities in the white matter, likely small vessel disease. No definable abnormal postcontrast enhancement, when correlated with precontrast T1 weighted images, see for instance precontrast series 9, image 60 compared with postcontrast series 11, image 34. Vascular: Unable to assess.  Grossly negative. Skull and upper cervical spine: Grossly negative. Sinuses/Orbits: Grossly negative. Other: Grossly negative. IMPRESSION: Motion degraded study of marginal diagnostic utility demonstrates a large RIGHT  parietal hemorrhage, not clearly increased in size from priors. No visible midline shift. Abnormal signal on diffusion-weighted imaging is largely related to susceptibility artifact, but there may be gyriform like acute infarction of the medial RIGHT parietal lobe, as well as a punctate focus of acute infarction in the RIGHT occipital lobe. See discussion above. There is no visible abnormal postcontrast enhancement to suggest underlying mass, when axial precontrast T1 weighted images are compared with those postcontrast. Consider repeat MRI brain in 6 weeks, when there has been partial involution of the hemorrhage, as well as hopefully improvement in the clinical status so that the patient might remain motionless. Electronically Signed  By: Elsie StainJohn T Curnes M.D.   On: 02/22/2019 17:43       Medical Problem List and Plan: 1.  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  Antithrombotics: -DVT/anticoagulation: SCDs  -antiplatelet therapy: N/A 3. Pain Management: Tylenol as needed 4. Mood: BuSpar 5 mg twice daily, Pamelor 20 mg nightly  -antipsychotic agents: pt with difficulty sleeping at night with increased confusion and restlessness per staff/family. Begin low dose seroquel 12.5mg  qhs   -keep sleep chart   -try to re-establish normal sleep-wake patterns.   -pt has decompensated in hospital b/f per family  5. Neuropsych: This patient is capable of making decisions on her own behalf. 6. Skin/Wound Care: Routine skin checks 7. Fluids/Electrolytes/Nutrition: Routine in and outs with follow-up chemistries 8.  Hypertension.  HCTZ 25 mg daily, Tenormin 100 mg twice daily, Norvasc 5 mg daily.  Monitor with increased mobility 9.  Neurogenic bladder secondary to a fall 2007.  Independent self caths.  [facility]  -will do I/O caths per RN until she improves neurologically 10.   Ulcerative colitis.  Mesalamine 800 mg 3 times daily 11.  Hyperlipidemia.  Vytorin currently on hold secondary to intracerebral hemorrhage      Charlton AmorDaniel J Angiulli, PA-C 02/24/2019

## 2019-02-24 NOTE — Progress Notes (Signed)
Inpatient Rehabilitation Admissions Coordinator  I met with patient with her daughter , Carol/POA at bedside. I discussed goals an expectations of an inpt rehab admit. They prefer an inpt rehab admit rather than SNF with goals for d/c to ALF after CIR. I will contact Dr. Rockne Menghini to ask if pt medically ready to d/c to CIR today.  Danne Baxter, RN, MSN Rehab Admissions Coordinator [phone] 02/24/2019 1:44 PM

## 2019-02-24 NOTE — TOC Transition Note (Signed)
Transition of Care [facility]) - CM/SW Discharge Note   Patient Details  Name: Renee Fritz MRN: [id number] Date of Birth: December 14, 1925  [facility] CM/SW Contact:  Pollie Friar, RN Phone Number: 02/24/2019, 4:09 PM   Clinical Narrative:    Pt is discharging to CIR today. CM signing off.   Final next level of care: IP Rehab Facility Barriers to Discharge: No Barriers Identified   Patient Goals and CMS Choice        Discharge Placement                       Discharge Plan and Services                                     Social Determinants of Health (SDOH) Interventions     Readmission Risk Interventions No flowsheet data found.

## 2019-02-24 NOTE — Progress Notes (Signed)
STROKE TEAM PROGRESS NOTE   SUBJECTIVE (INTERVAL HISTORY) She is neurologically stable but confused and disoriented.  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  Blood pressure adequately controlled.  She has been evaluated by rehab team and will likely be transferred to inpatient rehab rather than SNF as family prefers that OBJECTIVE Vitals:   02/24/19 0700 02/24/19 0829 02/24/19 0952 02/24/19 1100  BP: 110/70  112/66 128/65  Pulse: 79  95 72  Resp: 20  20 20   Temp: 98.3 F (36.8 C)   98 F (36.7 C)  TempSrc: Axillary   Axillary  SpO2: (!) 85% 95% 96% 95%  Weight:      Height:        CBC:  Recent Labs  Lab 02/21/19 1533  WBC 6.8  NEUTROABS 4.8  HGB 12.Fritz  HCT 37.6  MCV 92.6  PLT 260    Basic Metabolic Panel:  Recent Labs  Lab 02/21/19 1533  NA 137  K 4.0  CL 103  CO2 26  GLUCOSE 97  BUN 13  CREATININE 0.58  CALCIUM 8.8*    Lipid Panel:     Component Value Date/Time   CHOL 106 02/24/2019 0749   TRIG 69 02/24/2019 0749   HDL 36 (L) 02/24/2019 0749   CHOLHDL Fritz.9 02/24/2019 0749   VLDL 14 02/24/2019 0749   LDLCALC 56 02/24/2019 0749   HgbA1c:  Lab Results  Component Value Date   HGBA1C 5.6 02/24/2019    IMAGING p Ct Head Wo Contrast  02/21/2019 Acute RIGHT parietal hemorrhage, 41 x 33 x 46 mm, with mild surrounding edema but no RIGHT-to-LEFT shift or intraventricular extension. Slight subarachnoid blood. Approximate volume 34 mL. Favor hypertensive etiology. Imaging features are atypical for posttraumatic hematoma, and there is no evidence of skull fracture or significant scalp hematoma.   Ct Cervical Spine Wo Contrast 02/21/2019 No cervical spine fracture or traumatic subluxation. Multilevel spondylosis.   Ct Angio Head W Or Wo Contrast 02/21/2019 Unchanged 3 x 4 cm RIGHT parietal hemorrhage. This likely represents a lobar hypertensive bleed. There is no evidence of large vessel occlusion, saccular/mycotic aneurysm, visible venous thrombosis, or  arteriovenous malformation contributing to the observed intracranial hemorrhage.   MRI Brain W&WO Contrast  02/22/2019  Motion degraded study of marginal diagnostic utility demonstrates a large RIGHT parietal hemorrhage, not clearly increased in size from priors. No visible midline shift.  Abnormal signal on diffusion-weighted imaging is largely related to susceptibility artifact, but there may be gyriform like acute infarction of the medial RIGHT parietal lobe, as well as a punctate focus of acute infarction in the RIGHT occipital lobe.   There is no visible abnormal postcontrast enhancement to suggest underlying mass, when axial precontrast T1 weighted images are compared with those postcontrast.  Consider repeat MRI brain in 6 weeks, when there has been partial involution of the hemorrhage, as well as hopefully improvement in the clinical status so that the patient might remain motionless.  Dg Chest 1 View 02/21/2019 No active disease.   Dg Thoracic Spine Fritz View 02/21/2019 1. Osseous demineralization limits the exam  Fritz. Chronic compression deformity T4. Treated compression deformities T11-T12. No definite acute osseous abnormality   Dg Lumbar Spine Complete 02/21/2019 1. No acute osseous abnormality  Fritz. Excreted contrast within the renal collecting systems and bladder   Transthoracic Echocardiogram  02/23/2019  1. The left ventricle has normal systolic function with an ejection fraction of 60-65%. The cavity size was normal. There is mildly increased left ventricular wall  thickness. Left ventricular diastolic Doppler parameters are consistent with impaired relaxation.  Fritz. The mitral valve is grossly normal. There is mild mitral annular calcification present.  3. The tricuspid valve is grossly normal.  4. The aortic valve is tricuspid. Mild thickening of the aortic valve. Aortic valve regurgitation is trivial by color flow Doppler. No stenosis of the aortic valve.  5. Normal LV  systolic function; mild diastolic dysfunction; mild LVH; trace AI.  EKG - SR rate 89 BPM. (See cardiology reading for complete details)    PHYSICAL EXAM Blood pressure 128/65, pulse 72, temperature 98 F (36.7 C), temperature source Axillary, resp. rate 20, height 5' (1.524 m), weight 41.8 kg, SpO2 95 %. Pleasant frail elderly Caucasian lady not in distress. . Afebrile. Head is nontraumatic. Neck is supple without bruit.    Cardiac exam no murmur or gallop. Lungs are clear to auscultation. Distal pulses are well felt. Neurological Exam ;  Awake  Alert oriented to person only.  Can follow barely simple midline commands only.  Diminished attention, registration and recall.  No aphasia apraxia or dysarthria.  Speech is clear without dysarthria but often tangential and gets easily distracted...fundi were not visualized..  Right gaze preference can look to the left slightly past midline but not all the way.  Dense left homonymous hemianopsia.  Mild left-sided neglect.Marland Kitchen Hearing is normal. Palatal movements are normal. Face symmetric. Tongue midline. Normal strength, tone, reflexes and coordination except mild weakness of left grip and intrinsic hand muscles.  Fine finger movements are diminished on the left.  Orbits right over left upper extremity.. Normal sensation. Gait deferred.      ASSESSMENT/PLAN Renee Fritz is a 85 y.o. female with history of hypertension, cardiac arrthymias (unknown),  and ulcerative colitis presenting with AMS. She did not receive IV t-PA due to [place].  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 Slight subarachnoid blood.  Approximate volume 34 mL.  MRI W&WO Head - large R parietal hmg. Roccipital lobe infarct. No enhancement.   CTA Head - Unchanged 3 x 4 cm RIGHT parietal hemorrhage. This likely represents a lobar hypertensive bleed.  2D Echo - EF 60-65%. No source of embolus   Renee Fritz  - negative  U/A - unremarkable  LDL - pending   HgbA1c - pending   UDS - not performed  VTE prophylaxis - scds  No antithrombotic prior to admission, now on No antithrombotic given hmg  Consider OP 30d montir  Therapy recommendations:  SNF. Ok to be OOB  Disposition:  Pending  Hypertension  Stable . SBP goal < 160 mmHg . Long-term BP goal normotensive  Hyperlipidemia  Lipid lowering medication PTA:  Vytorin 10/20 mg daily -> D/C due to hemorrhage  Current lipid lowering medication: none  LDL pending, goal < 70  Plan to resume statin 6/23 based on LDL  Continue statin at discharge  Other Stroke Risk Factors  Advanced age  Other Active Problems  Confusion, suspect baseline dementia  Hospital day # 3  The patient remains neurologically stable but appears to have cognitive impairment likely mild vascular dementia for which he may need evaluation and treatment later.Marland Kitchen  MRI scan does  show what looks like a hemorrhagic infarct.  Patient is unlikely to be a candidate for anticoagulation given the extent of the brain hemorrhage hence will not pursue aggressive work-up for A. fib at the present time.  May consider outpatient 30-day heart monitor after discharge if there is significant improvement in her cognitive abilities.  Appreciate help from medical hospitalist team.    Discussed with rehab coordinator.    Delia HeadyPramod Diarra Kos, MD Medical Director [facility] Pager: [phone] 02/24/2019 Fritz:56 PM   To contact Stroke Continuity provider, please refer to [email or web address]. After hours, contact General Neurology

## 2019-02-24 NOTE — Progress Notes (Signed)
Pt stable during shift; pt had multiple episodes of HR rhythm going to Sinus tach; or A-fib 140-160's even when pt sleeping; pt received multiple doses of prn lopressor to help control HR. Will report off to oncoming RN. Delia Heady RN

## 2019-02-24 NOTE — Progress Notes (Signed)
Meredith Staggers, MD  Physician  Physical Medicine and Rehabilitation  PMR Pre-admission  Signed  Date of Service:  02/24/2019 2:35 PM      Related encounter: ED to Hosp-Admission (Current) from 02/21/2019 in [facility]      Signed         Show:Clear all _0 Manual_1 Template_2 Copied  Added by: _3 Cristina Gong, RN_4 Meredith Staggers, MD  _5 Hover for details PMR Admission Coordinator Pre-Admission Assessment  Patient: Renee Fritz is an 85 y.o., female MRN: [id number] DOB: 1925-11-08 Height: 5' (152.4 cm) Weight: 41.8 kg  Insurance Information HMO:     PPO:      PCP:      IPA:      80/20:      OTHER: no HMO PRIMARY: Medicare a and b      Policy#: [id number]      Subscriber: pt Benefits:  Phone #: passport one online     Name: 6/23 Eff. Date: 02/01/1993     Deduct: $1408      Out of Pocket Max: none      Life Max: none CIR: 100%      SNF: 20 full days Outpatient: 80%     Co-Pay: 20% Home Health: 100%      Co-Pay: none DME: 80%     Co-Pay: 20% Providers: pt choice  SECONDARY: Tricare for Life      Policy#: [id number]      Subscriber: pt  Medicaid Application Date:       Case Manager:  Disability Application Date:       Case Worker:   The "Data Collection Information Summary" for patients in Inpatient Rehabilitation Facilities with attached "Privacy Act [place] Records" was provided and verbally reviewed with: Family  Emergency Contact Information         Contact Information    Name Relation Home Work Mobile   Gillentine,Carol  [phone]     Sharmila, [place] [phone]     Antonietta, [place]  [phone]        Current Medical History  Patient Admitting Diagnosis: CVA  History of Present Illness:Renee Fritz is a 85 year old right-handed female with history of hypertension,ulcerative colitis, neurogenic bladder 2007 after a fall followed by urology services Dr. Venia Minks [facility] urology center.  Presented 02/21/2019 with altered mental status. Son had reported she recently stumbled and struck her left forehead into the wall. Cranial CT scan showed acute right parietal hemorrhage 41 x 33 x 46 mm with mild surrounding edema but no right to left shift or intraventricular extension. Slight subarachnoid blood. CT cervical spine negative. CT angiogram of the head with no large vessel occlusion. Follow-up MRI again shows right parietal hemorrhage not increased in size from prior tracings. No visible midline shift. Neurology follow-up with conservative care. Tolerating a regular diet.   Patient's medical record from [facility] has been reviewed by the rehabilitation admission coordinator and physician.  Past Medical History      Past Medical History:  Diagnosis Date  . Hypertension   . Incarcerated femoral hernia 07/03/2017  . Osteoporosis   . Other specified cardiac arrhythmias   . Pneumonia   . Ulcerative (chronic) enterocolitis ([place])     Family History   Family history is unknown by patient.  Prior Rehab/Hospitalizations Has the patient had prior rehab or hospitalizations prior to admission? Yes 2007 was at [facility]  Has the patient had major surgery during 100 days prior to admission? No  Current Medications  Current Facility-Administered Medications:  .   stroke: mapping our early stages of recovery book, , Does not apply, Once, Donzetta Starch, NP .  acetaminophen (TYLENOL) tablet 650 mg, 650 mg, Oral, Q4H PRN, 650 mg at 02/22/19 2230 **OR** acetaminophen (TYLENOL) solution 650 mg, 650 mg, Per Tube, Q4H PRN **OR** acetaminophen (TYLENOL) suppository 650 mg, 650 mg, Rectal, Q4H PRN, Biby, Sharon L, NP .  amLODipine (NORVASC) tablet 5 mg, 5 mg, Oral, Daily, Biby, Sharon L, NP, 5 mg at 02/24/19 1001 .  atenolol (TENORMIN) tablet 100 mg, 100 mg, Oral, BID, Biby, Sharon L, NP, 100 mg at 02/24/19 1004 .  busPIRone (BUSPAR)  tablet 5 mg, 5 mg, Oral, BID, Biby, Sharon L, NP, 5 mg at 02/23/19 0837 .  Chlorhexidine Gluconate Cloth 2 % PADS 6 each, 6 each, Topical, Q0600, Biby, Sharon L, NP, 6 each at 02/24/19 1011 .  hydrochlorothiazide (HYDRODIURIL) tablet 25 mg, 25 mg, Oral, Daily, Biby, Sharon L, NP, 25 mg at 02/23/19 0837 .  hydrocortisone (ANUSOL-HC) suppository 25 mg, 25 mg, Rectal, BID, Biby, Sharon L, NP, 25 mg at 02/23/19 2125 .  MEDLINE mouth rinse, 15 mL, Mouth Rinse, BID, Biby, Sharon L, NP, 15 mL at 02/24/19 0944 .  Mesalamine (ASACOL) DR capsule 800 mg, 800 mg, Oral, TID WC, Biby, Sharon L, NP, 800 mg at 02/24/19 1348 .  metoprolol tartrate (LOPRESSOR) injection 2.5 mg, 2.5 mg, Intravenous, Q5 min PRN, Greta Doom, MD, 2.5 mg at 02/24/19 0615 .  mupirocin ointment (BACTROBAN) 2 % 1 application, 1 application, Nasal, BID, Rama, Venetia Maxon, MD, 1 application at 26/41/58 0947 .  nortriptyline (PAMELOR) capsule 20 mg, 20 mg, Oral, QHS, Biby, Sharon L, NP, 20 mg at 02/22/19 2127 .  pantoprazole (PROTONIX) EC tablet 40 mg, 40 mg, Oral, QHS, Rama, Christina P, MD .  potassium chloride 20 MEQ/15ML (10%) solution 10 mEq, 10 mEq, Oral, BID, Biby, Sharon L, NP, 10 mEq at 02/24/19 1007 .  senna-docusate (Senokot-S) tablet 1 tablet, 1 tablet, Oral, BID, Biby, Sharon L, NP, 1 tablet at 02/24/19 1006  Patients Current Diet:     Diet Order                  Diet - low sodium heart healthy         Diet regular Room service appropriate? Yes; Fluid consistency: Thin  Diet effective now               Precautions / Restrictions Precautions Precautions: Fall, Other (comment) Precaution Comments: lt neglect, field cut Restrictions Weight Bearing Restrictions: No   Has the patient had 2 or more falls or a fall with injury in the past year? Yes  Prior Activity Level Limited Community (1-2x/wk): Mod I with cane, aide twice week  Prior Functional Level Self Care: Did the patient need help  bathing, dressing, using the toilet or eating? Needed some help  Indoor Mobility: Did the patient need assistance with walking from room to room (with or without device)? Independent  Stairs: Did the patient need assistance with internal or external stairs (with or without device)? Independent  Functional Cognition: Did the patient need help planning regular tasks such as shopping or remembering to take medications? Needed some help  Home Assistive Devices / [place] Devices/Equipment: None Home Equipment: Grab bars - tub/shower, Environmental consultant - 2 wheels, Cane - single point  Prior Device Use: Indicate devices/aids used by the patient prior to current illness, exacerbation  or injury? cane  Current Functional Level Cognition  Arousal/Alertness: Awake/alert Overall Cognitive Status: Impaired/Different from baseline Current Attention Level: Focused Orientation Level: Oriented to person, Oriented to place, Disoriented to time, Disoriented to situation Following Commands: Follows one step commands inconsistently, Follows one step commands with increased time Safety/Judgement: Decreased awareness of safety, Decreased awareness of deficits General Comments: Pt with significant lt neglect. Difficulty attending to any task or question Attention: Focused, Sustained, Selective, Alternating Focused Attention: Appears intact Sustained Attention: Impaired Sustained Attention Impairment: Verbal basic, Functional basic Selective Attention: Impaired Selective Attention Impairment: Verbal basic, Functional basic Alternating Attention: Impaired Alternating Attention Impairment: Verbal basic, Functional basic Memory: Impaired Memory Impairment: Storage deficit Awareness: Impaired Awareness Impairment: Intellectual impairment, Emergent impairment(better anticipatory awareness than other ) Problem Solving: Impaired Problem Solving Impairment: Functional basic Executive Function: Self  Monitoring Self Monitoring: Impaired Self Monitoring Impairment: Verbal basic Safety/Judgment: Impaired    Extremity Assessment (includes Sensation/Coordination)  Upper Extremity Assessment: LUE deficits/detail, RUE deficits/detail RUE Deficits / Details: arthritic changes in hand, difficult to formally assess due to cognition, appears grossly intact LUE Deficits / Details: arthritic changes in hand, maintains in flexion in lap, no functional use elicited LUE Coordination: decreased fine motor, decreased gross motor  Lower Extremity Assessment: Defer to PT evaluation LLE Deficits / Details: functionally weaker than rt. Neglect.  LLE Sensation: decreased light touch    ADLs  Overall ADL's : Needs assistance/impaired Eating/Feeding: Minimal assistance, Sitting Eating/Feeding Details (indicate cue type and reason): ate ice cream with spoon with assist to stabilize cup, drank with straw and use of R UE Grooming: Wash/dry hands, Sitting, Moderate assistance Grooming Details (indicate cue type and reason): decreased thoroughness, but used wash cloth appropriately Upper Body Bathing: Maximal assistance, Sitting Lower Body Bathing: Maximal assistance, Sit to/from stand Upper Body Dressing : Maximal assistance, Sitting Lower Body Dressing: Maximal assistance, Sit to/from stand Toilet Transfer: Moderate assistance, Stand-pivot Toileting- Clothing Manipulation and Hygiene: Maximal assistance, Sit to/from stand    Mobility  Overal bed mobility: Needs Assistance Bed Mobility: Supine to Sit Supine to sit: Mod assist, Max assist General bed mobility comments: required assist to guide LE to EOB as well as trunk control to come into sitting    Transfers  Overall transfer level: Needs assistance Equipment used: Rolling walker (2 wheeled) Transfers: Sit to/from Stand, Stand Pivot Transfers Sit to Stand: Min assist Stand pivot transfers: Mod assist General transfer comment: assist to stand  and steady, min assist to transfer R, mod to transfer L due to neglect    Ambulation / Gait / Stairs / Wheelchair Mobility  Ambulation/Gait Ambulation/Gait assistance: Mod assist Gait Distance (Feet): 80 Feet Assistive device: Rolling walker (2 wheeled) Gait Pattern/deviations: Step-to pattern, Decreased stride length, Drifts right/left, Trunk flexed General Gait Details: unable to navigate RW without significant assist from PT - tendency to turn L throughout mobility; does not scan environment; up to Mod A for steadying Gait velocity: decr Gait velocity interpretation: <1.31 ft/sec, indicative of household ambulator    Posture / Balance Dynamic Sitting Balance Sitting balance - Comments: Sat EOB with supervision x 10 minutes Balance Overall balance assessment: Needs assistance Sitting-balance support: Single extremity supported, Feet supported Sitting balance-Leahy Scale: Fair Sitting balance - Comments: Sat EOB with supervision x 10 minutes Standing balance support: Bilateral upper extremity supported, During functional activity Standing balance-Leahy Scale: Poor Standing balance comment: min assist for static standing    Special needs/care consideration BiPAP/CPAP  N/a CPM  N/a Continuous Drip IV  N/a  Dialysis n/a Life Vest  N/a Oxygen  N/a Special Bed  Low bed with floor pads due to high fall risk Trach Size  N/a Wound Vac n/a Skin  Ecchymosis to left forehead, left anterior arm skin tear; ecchymosis to BUE, stage 1 to back 3 x 0.8 x 0 Bowel mgmt: continent LBM 6/23 Bladder mgmt: self catheters QID at home Diabetic mgmt: n/a Behavioral consideration high fall risk with decreased safety awareness; increased confusion since admit. Daughter, Arbie Cookey, has approval beginning 6/23 to be visitor Chemo/radiation  N/a   Previous Home Environment  Living Arrangements: Alone  Lives With: Alone Available Help at Discharge: Available PRN/intermittently Type of Home: House Home  Layout: Two level, Bed/bath upstairs Alternate Level Stairs-Rails: Right Alternate Level Stairs-Number of Steps: 1 flight Home Access: Stairs to enter Entrance Stairs-Rails: Right, Left Bathroom Shower/Tub: Chiropodist: Standard Bathroom Accessibility: Yes How Accessible: Accessible via walker Home Care Services: Yes Type of Home Care Services: Homehealth aide Additional Comments: Pt performs self cath independently  Discharge Living Setting Plans for Discharge Living Setting: Other (Comment)(patietn has been planning move to [facility] to senior living or) Type of Home at Discharge: [place] Name at Discharge: daughter and patient had been touring before COVID  Does the patient have any problems obtaining your medications?: No  Daughter plans d/c to ALF in [place] area at d/c. This is Cindie Laroche mother in law BUT, Arbie Cookey is NOK/POA contact  Social/Family/Support Systems Patient Roles: Parent Contact Information: Jana Half, daughter and POA Anticipated Caregiver: ALF to be determined based on her care needs after CIR Anticipated Caregiver's Contact Information: see above Ability/Limitations of Caregiver: intermittent assist Caregiver Availability: (ALF) Discharge Plan Discussed with Primary Caregiver: Yes Is Caregiver In Agreement with Plan?: Yes Does Caregiver/Family have Issues with Lodging/Transportation while Pt is in Rehab?: Yes(Carol, daughter has been approved to be one visitor for hosp)  Goals/Additional Needs Patient/Family Goal for Rehab: Mod I to superivison with PT, OT and SLP Expected length of stay: ELOS 14 to 20 days  Decrease burden of Care through IP rehab admission: n/a  Possible need for SNF placement upon discharge:  ALF after CIR  Patient Condition: I have reviewed medical records from [facility] , spoken with CM, and patient and daughter. I met with patient at the bedside for inpatient rehabilitation  assessment.  Patient will benefit from ongoing PT, OT and SLP, can actively participate in 3 hours of therapy a day 5 days of the week, and can make measurable gains during the admission.  Patient will also benefit from the coordinated team approach during an Inpatient Acute Rehabilitation admission.  The patient will receive intensive therapy as well as Rehabilitation physician, nursing, social worker, and care management interventions.  Due to bladder management, bowel management, safety, skin/wound care, disease management, medication administration, pain management and patient education the patient requires 24 hour a day rehabilitation nursing.  The patient is currently min to mod assist with mobility and basic ADLs.  Discharge setting and therapy post discharge at assisted living facility is anticipated.  Patient has agreed to participate in the Acute Inpatient Rehabilitation Program and will admit today.  Preadmission Screen Completed By:  Cleatrice Burke RN MSN, 02/24/2019 2:35 PM ______________________________________________________________________   Discussed status with Dr. Naaman Plummer  on  02/24/2019 at  1444 and received approval for admission today.  Admission Coordinator:  Cleatrice Burke, RN, time  0881 Date  02/24/2019   Assessment/Plan: Diagnosis: right parietal ICH  with IVH 1. Does the need for close, 24 hr/day Medical supervision in concert with the patient's rehab needs make it unreasonable for this patient to be served in a less intensive setting? Yes 2. Co-Morbidities requiring supervision/potential complications: HTN 3. Due to bladder management, bowel management, safety, skin/wound care, disease management, medication administration, pain management and patient education, does the patient require 24 hr/day rehab nursing? Yes 4. Does the patient require coordinated care of a physician, rehab nurse, PT (1-2 hrs/day, 5 days/week), OT (1-2 hrs/day, 5 days/week) and SLP  (1-2 hrs/day, 5 days/week) to address physical and functional deficits in the context of the above medical diagnosis(es)? Yes Addressing deficits in the following areas: balance, endurance, locomotion, strength, transferring, bowel/bladder control, bathing, dressing, feeding, grooming, toileting, cognition, speech and psychosocial support 5. Can the patient actively participate in an intensive therapy program of at least 3 hrs of therapy 5 days a week? Yes 6. The potential for patient to make measurable gains while on inpatient rehab is excellent 7. Anticipated functional outcomes upon discharge from inpatients are: modified independent and supervision PT, modified independent and supervision OT, modified independent and supervision SLP 8. Estimated rehab length of stay to reach the above functional goals is: 14-20 days 9. Anticipated D/C setting: Home 10. Anticipated post D/C treatments: [place] therapy 11. Overall Rehab/Functional Prognosis: excellent  MD Signature: Meredith Staggers, MD, [place] [facility] 02/24/2019         Revision History

## 2019-02-24 NOTE — Discharge Summary (Signed)
Physician Discharge Summary  Renee Fritz OZH:[id number] DOB: Oct 19, 1925 DOA: 02/21/2019  PCP: Lester Newcastle., MD  Admit date: 02/21/2019 Discharge date: 02/24/2019  Admitted From: Home Discharge disposition: CIR   Recommendations for Outpatient Follow-Up:   1. The patient is being d/c'd to CIR for post-stroke rehabilitation. 2. Will need placement of an event monitor by cardiology.   Discharge Diagnosis:   Principal Problem:   ICH (intracerebral hemorrhage) (HCC) Active Problems:   Essential hypertension   Hyperlipidemia   Dysphasia due to recent stroke   Ulcerative colitis (HCC)   MRSA carrier   Gaze palsy   Homonymous hemianopsia due to recent stroke   Left hemiparesis (HCC)   Left-sided neglect  Discharge Condition: Stable, guarded.  Diet recommendation: Low sodium, heart healthy.   Code status: Full.   History of Present Illness:   Renee Fritz is an 85 y.o. female with a PMH of hypertension, osteoporosis and ulcerative colitis who was admitted 02/21/2019 by neurology for treatment of an acute right parietofrontal intracerebral hemorrhage with mild surrounding edema, felt to be from a hypertensive etiology.  Noted to have a left gaze palsy, left homonymous hemianopsia and left-sided weakness.  Speech therapy noted dysphasia as well.  Hospital Course by Problem:   Principal Problem:   ICH (intracerebral hemorrhage) (HCC) in gaze palsy, homonymous hemianopsia, left hemiparesis and left-sided neglect Admitted by the stroke service.  CT/MRI scans personally reviewed and shows a large right hemispheric infarct with hemorrhagic transformation.  The echo completed 02/23/2019, no embolic source.  EF 60-65% with mild diastolic dysfunction.  EKG showed normal sinus rhythm.  Now, medically stable and will need rehabilitation at a skilled facility.  PT/OT/speech therapy followed in house.  Hemoglobin A1c 5.6 and LDL 56.  Will need a 30-day event monitor. Discharging to  CIR.    Active Problems:   Essential hypertension Continue Norvasc, hydrochlorothiazide and atenolol.      Hyperlipidemia Vytorin currently on hold secondary to intracerebral hemorrhage.    Dysphasia due to recent stroke Speech therapy following.  Continue diet per recommendations.    Ulcerative colitis (HCC) Continue mesalamine.    MRSA carrier Continue chlorhexidine baths.   Medical Consultants:    Neurology  CIR  Discharge Exam:   Vitals:   02/24/19 0952 02/24/19 1100  BP: 112/66 128/65  Pulse: 95 72  Resp: 20 20  Temp:  98 F (36.7 C)  SpO2: 96% 95%   Vitals:   02/24/19 0700 02/24/19 0829 02/24/19 0952 02/24/19 1100  BP: 110/70  112/66 128/65  Pulse: 79  95 72  Resp: Temp: 98.3 F (36.8 C)   98 F (36.7 C)  TempSrc: Axillary   Axillary  SpO2: (!) 85% 95% 96% 95%  Weight:      Height:        General: No acute distress. Cardiovascular: Heart sounds show a regular rate, and rhythm. No gallops or rubs. No murmurs. No JVD. Lungs: Clear to auscultation bilaterally with good air movement. No rales, rhonchi or wheezes. Abdomen: Soft, nontender, nondistended with normal active bowel sounds. No masses. No hepatosplenomegaly. Neurological: Alert and oriented 2. Left hemiparesis, gaze palsy, left homonymous hemianopsia. Skin: Warm and dry. Bruise left forehead and scattered UE/LE ecchymosis. Extremities: No clubbing or cyanosis. No edema. Pedal pulses 2+. Psychiatric: Mood and affect are normal. Insight and judgment are impaired.  The results of significant diagnostics from this hospitalization (including imaging, microbiology, ancillary and laboratory) are listed below for reference.  Procedures and Diagnostic Studies:   Ct Angio Head W Or Wo Contrast  Result Date: 02/21/2019 CLINICAL DATA:  Cerebral hemorrhage. EXAM: CT ANGIOGRAPHY HEAD TECHNIQUE: Multidetector CT imaging of the head was performed using the standard protocol during  bolus administration of intravenous contrast. Multiplanar CT image reconstructions and MIPs were obtained to evaluate the vascular anatomy. CONTRAST:  100mL OMNIPAQUE IOHEXOL 350 MG/ML SOLN COMPARISON:  None. FINDINGS: CTA HEAD Anterior circulation: No significant stenosis, proximal occlusion, aneurysm, or vascular malformation. Unchanged 3 x 4 cm RIGHT parietal hemorrhage. Posterior circulation: No significant stenosis, proximal occlusion, aneurysm, or vascular malformation. Venous sinuses: As permitted by contrast timing, patent. Anatomic variants: None of significance Delayed phase: Not performed. IMPRESSION: Unchanged 3 x 4 cm RIGHT parietal hemorrhage. This likely represents a lobar hypertensive bleed. There is no evidence of large vessel occlusion, saccular/mycotic aneurysm, visible venous thrombosis, or arteriovenous malformation contributing to the observed intracranial hemorrhage. Electronically Signed   By: Elsie StainJohn T Curnes M.D.   On: 02/21/2019 18:46   Dg Chest 1 View  Result Date: 02/21/2019 CLINICAL DATA:  Altered mental status EXAM: CHEST  1 VIEW COMPARISON:  06/07/2018 FINDINGS: No focal opacity or pleural effusion. Linear scarring in the right lower lung. Stable cardiomediastinal silhouette. No pneumothorax. Old bilateral rib fractures. Old fracture deformity right clavicle IMPRESSION: No active disease. Electronically Signed   By: Jasmine PangKim  Fujinaga M.D.   On: 02/21/2019 18:57   Dg Thoracic Spine 2 View  Result Date: 02/21/2019 CLINICAL DATA:  Fall with back pain EXAM: THORACIC SPINE 2 VIEWS COMPARISON:  02/21/2019, 06/07/2018 FINDINGS: Chronic fracture deformity of the right mid clavicle. Old bilateral rib fractures. Kyphosis of the lower thoracic spine with treated compression deformities at T11 and T12. Mild compression deformity T4. Remaining vertebral bodies demonstrate normal stature. IMPRESSION: 1. Osseous demineralization limits the exam 2. Chronic compression deformity T4. Treated  compression deformities T11-T12. No definite acute osseous abnormality Electronically Signed   By: Jasmine PangKim  Fujinaga M.D.   On: 02/21/2019 19:00   Dg Lumbar Spine Complete  Result Date: 02/21/2019 CLINICAL DATA:  Fall with back pain EXAM: LUMBAR SPINE - COMPLETE 4+ VIEW COMPARISON:  CT 09/04/2018 FINDINGS: Prior hernia repair. Clips in the right upper quadrant and pelvis. Excreted contrast within the renal collecting systems and urinary bladder. Lumbar alignment within normal limits. Treated compression fractures at T11 and T12. Lumbar vertebra demonstrate normal stature. Trace anterolisthesis L5 on S1, no change IMPRESSION: 1. No acute osseous abnormality 2. Excreted contrast within the renal collecting systems and bladder Electronically Signed   By: Jasmine PangKim  Fujinaga M.D.   On: 02/21/2019 19:03   Ct Head Wo Contrast  Result Date: 02/21/2019 CLINICAL DATA:  Confusion. Possible fall. EXAM: CT HEAD WITHOUT CONTRAST CT CERVICAL SPINE WITHOUT CONTRAST TECHNIQUE: Multidetector CT imaging of the head and cervical spine was performed following the standard protocol without intravenous contrast. Multiplanar CT image reconstructions of the cervical spine were also generated. COMPARISON:  02/14/2018. FINDINGS: CT HEAD FINDINGS Brain: There is an acute RIGHT parietal hemorrhage, 41 x 33 x 46 mm, mild surrounding edema, no RIGHT-to-LEFT shift or intraventricular extension. Slight subarachnoid blood. Elsewhere there is atrophy with chronic microvascular ischemic change. Vascular: Calcification of the cavernous internal carotid arteries consistent with cerebrovascular atherosclerotic disease. No signs of intracranial large vessel occlusion. Skull: No evidence of skull fracture. No significant scalp hematoma. Sinuses/Orbits: No acute findings. Other: None. CT CERVICAL SPINE FINDINGS Alignment: Straightening of the normal cervical lordosis. 4 mm anterolisthesis C4 on C5. Skull base and  vertebrae: No acute fracture. No primary bone  lesion or focal pathologic process. Soft tissues and spinal canal: No prevertebral soft tissue swelling. Atherosclerosis. Disc levels:  Multilevel spondylosis, worst at C4-5 and C5-6. Upper chest: Negative. Other: None IMPRESSION: 1. Acute RIGHT parietal hemorrhage, 41 x 33 x 46 mm, with mild surrounding edema but no RIGHT-to-LEFT shift or intraventricular extension. Slight subarachnoid blood. Approximate volume 34 mL. Favor hypertensive etiology. Imaging features are atypical for posttraumatic hematoma, and there is no evidence of skull fracture or significant scalp hematoma. 2. No cervical spine fracture or traumatic subluxation. Multilevel spondylosis. 3. Critical Value/emergent results were called by telephone at the time of interpretation on 02/21/2019 at 4:35 pm to Dr. Dorie Rank , who verbally acknowledged these results. Electronically Signed   By: Staci Righter M.D.   On: 02/21/2019 16:40   Ct Cervical Spine Wo Contrast  Result Date: 02/21/2019 CLINICAL DATA:  Confusion. Possible fall. EXAM: CT HEAD WITHOUT CONTRAST CT CERVICAL SPINE WITHOUT CONTRAST TECHNIQUE: Multidetector CT imaging of the head and cervical spine was performed following the standard protocol without intravenous contrast. Multiplanar CT image reconstructions of the cervical spine were also generated. COMPARISON:  02/14/2018. FINDINGS: CT HEAD FINDINGS Brain: There is an acute RIGHT parietal hemorrhage, 41 x 33 x 46 mm, mild surrounding edema, no RIGHT-to-LEFT shift or intraventricular extension. Slight subarachnoid blood. Elsewhere there is atrophy with chronic microvascular ischemic change. Vascular: Calcification of the cavernous internal carotid arteries consistent with cerebrovascular atherosclerotic disease. No signs of intracranial large vessel occlusion. Skull: No evidence of skull fracture. No significant scalp hematoma. Sinuses/Orbits: No acute findings. Other: None. CT CERVICAL SPINE FINDINGS Alignment: Straightening of the  normal cervical lordosis. 4 mm anterolisthesis C4 on C5. Skull base and vertebrae: No acute fracture. No primary bone lesion or focal pathologic process. Soft tissues and spinal canal: No prevertebral soft tissue swelling. Atherosclerosis. Disc levels:  Multilevel spondylosis, worst at C4-5 and C5-6. Upper chest: Negative. Other: None IMPRESSION: 1. Acute RIGHT parietal hemorrhage, 41 x 33 x 46 mm, with mild surrounding edema but no RIGHT-to-LEFT shift or intraventricular extension. Slight subarachnoid blood. Approximate volume 34 mL. Favor hypertensive etiology. Imaging features are atypical for posttraumatic hematoma, and there is no evidence of skull fracture or significant scalp hematoma. 2. No cervical spine fracture or traumatic subluxation. Multilevel spondylosis. 3. Critical Value/emergent results were called by telephone at the time of interpretation on 02/21/2019 at 4:35 pm to Dr. Dorie Rank , who verbally acknowledged these results. Electronically Signed   By: Staci Righter M.D.   On: 02/21/2019 16:40   Mr Jeri Cos GH Contrast  Result Date: 02/22/2019 CLINICAL DATA:  Intracranial hemorrhage.  Continued surveillance. EXAM: MRI HEAD WITHOUT AND WITH CONTRAST TECHNIQUE: Multiplanar, multiecho pulse sequences of the brain and surrounding structures were obtained without and with intravenous contrast. CONTRAST:  Gadavist 8 mL COMPARISON:  CT head 02/21/2019 FINDINGS: There is significant motion degradation. The study is of marginal diagnostic utility. Brain: Large RIGHT parietal hemorrhage redemonstrated, not clearly increased in size from priors. Surrounding edema. Peripheral restriction, likely susceptibility related although medial RIGHT parietal gyral restriction could represent acute ischemia; see series 3, image 39. Punctate focus of low level restriction RIGHT occipital lobe. Generalized atrophy. Extensive T2 and FLAIR hyperintensities in the white matter, likely small vessel disease. No definable  abnormal postcontrast enhancement, when correlated with precontrast T1 weighted images, see for instance precontrast series 9, image 60 compared with postcontrast series 11, image 34. Vascular: Unable to assess.  Grossly negative. Skull and upper cervical spine: Grossly negative. Sinuses/Orbits: Grossly negative. Other: Grossly negative. IMPRESSION: Motion degraded study of marginal diagnostic utility demonstrates a large RIGHT parietal hemorrhage, not clearly increased in size from priors. No visible midline shift. Abnormal signal on diffusion-weighted imaging is largely related to susceptibility artifact, but there may be gyriform like acute infarction of the medial RIGHT parietal lobe, as well as a punctate focus of acute infarction in the RIGHT occipital lobe. See discussion above. There is no visible abnormal postcontrast enhancement to suggest underlying mass, when axial precontrast T1 weighted images are compared with those postcontrast. Consider repeat MRI brain in 6 weeks, when there has been partial involution of the hemorrhage, as well as hopefully improvement in the clinical status so that the patient might remain motionless. Electronically Signed   By: Elsie Stain M.D.   On: 02/22/2019 17:43     Labs:   Basic Metabolic Panel: Recent Labs  Lab 02/21/19 1533  NA 137  K 4.0  CL 103  CO2 26  GLUCOSE 97  BUN 13  CREATININE 0.58  CALCIUM 8.8*   GFR Estimated Creatinine Clearance: 30.2 mL/min (by C-G formula based on SCr of 0.58 mg/dL). Liver Function Tests: Recent Labs  Lab 02/21/19 1533  AST 28  ALT 17  ALKPHOS 74  BILITOT 0.6  PROT 7.2  ALBUMIN 3.9   CBC: Recent Labs  Lab 02/21/19 1533  WBC 6.8  NEUTROABS 4.8  HGB 12.2  HCT 37.6  MCV 92.6  PLT 260   CBG: Recent Labs  Lab 02/21/19 1534 02/21/19 2103 02/23/19 0740  GLUCAP 94 108* 85   Hgb A1c Recent Labs    02/24/19 0749  HGBA1C 5.6   Lipid Profile Recent Labs    02/24/19 0749  CHOL 106  HDL 36*   LDLCALC 56  TRIG 69  CHOLHDL 2.9    Microbiology Recent Results (from the past 240 hour(s))  SARS Coronavirus 2 (Hosp order,Performed in [facility] lab via [employer] ID)     Status: None   Collection Time: 02/21/19  5:12 PM   Specimen: Dry Nasal Swab ([employer] Now)  Result Value Ref Range Status   SARS Coronavirus 2 ([employer] Now) NEGATIVE NEGATIVE Final    Comment: (NOTE) Interpretive Result Comment(s): COVID 19 Positive SARS CoV 2 target nucleic acids are DETECTED. The SARS CoV 2 RNA is generally detectable in upper and lower respiratory specimens during the acute phase of infection.  Positive results are indicative of active infection with SARS CoV 2.  Clinical correlation with patient history and other diagnostic information is necessary to determine patient infection status.  Positive results do not rule out bacterial infection or coinfection with other viruses. The expected result is Negative. COVID 19 Negative SARS CoV 2 target nucleic acids are NOT DETECTED. The SARS CoV 2 RNA is generally detectable in upper and lower respiratory specimens during the acute phase of infection.  Negative results do not preclude SARS CoV 2 infection, do not rule out coinfections with other pathogens, and should not be used as the sole basis for treatment or other patient management decisions.  Negative results must be combined with clinical  observations, patient history, and epidemiological information. The expected result is Negative. Invalid Presence or absence of SARS CoV 2 nucleic acids cannot be determined. Repeat testing was performed on the submitted specimen and repeated Invalid results were obtained.  If clinically indicated, additional testing on a new specimen with an alternate test methodology [phone])  is advised.  The SARS CoV 2 RNA is generally detectable in upper and lower respiratory specimens during the acute phase of infection. The expected result is  Negative. Fact Sheet for Patients:  http://www.graves-ford.org/https://www.fda.gov/media/136524/download Fact Sheet for Healthcare Providers: EnviroConcern.sihttps://www.fda.gov/media/136523/download This test is not yet approved or cleared by the [place] FDA and has been authorized for detection and/or diagnosis of SARS CoV 2 by FDA under an Emergency Use Authorization (EUA).  This EUA will remain in effect (meaning this test can be used) for the duration of the COVID19 d eclaration under Section 564(b)(1) of the Act, 21 U.S.C. section 6783196560360bbb 3(b)(1), unless the authorization is terminated or revoked sooner. Performed at [facility] [place], [address] [place], [place] [address]   MRSA PCR Screening     Status: Abnormal   Collection Time: 02/21/19  9:18 PM   Specimen: Nasal Mucosa; Nasopharyngeal  Result Value Ref Range Status   MRSA by PCR POSITIVE (A) NEGATIVE Final    Comment: CRITICAL RESULT CALLED TO, READ BACK BY AND VERIFIED WITH: RIrine Seal, N. ANDERSON [id number] @0121  THANEY Performed at [facility], [address] [place], [place] [address]      Discharge Instructions:   Discharge Instructions    Call MD for:   Complete by: As directed    Stroke symptoms.   Call MD for:  difficulty breathing, headache or visual disturbances   Complete by: As directed    Call MD for:  extreme fatigue   Complete by: As directed    Diet - low sodium heart healthy   Complete by: As directed    Increase activity slowly   Complete by: As directed      Allergies as of 02/24/2019      Reactions   Estrace [estradiol] Other (See Comments)   burning   Pepcid [famotidine] Other (See Comments)   confusion   Phenergan [promethazine Hcl] Other (See Comments)   Feels like bugs crawling all over   Tramadol    Vancomycin Diarrhea, Other (See Comments)   Stomach pains   Erythromycin-sulfisoxazole Rash   Sulfa Antibiotics Rash      Medication List    STOP taking these medications   amLODipine 5 MG  tablet Commonly known as: NORVASC   cephALEXin 500 MG capsule Commonly known as: KEFLEX   ciprofloxacin 100 MG tablet Commonly known as: CIPRO   ezetimibe-simvastatin 10-20 MG tablet Commonly known as: VYTORIN   hydrochlorothiazide 25 MG tablet Commonly known as: HYDRODIURIL   metroNIDAZOLE 500 MG tablet Commonly known as: FLAGYL     TAKE these medications   Asacol HD 800 MG Tbec Generic drug: Mesalamine Take 800 mg by mouth 3 (three) times daily.   atenolol 100 MG tablet Commonly known as: TENORMIN Take 100 mg by mouth 2 (two) times daily.   busPIRone 5 MG tablet Commonly known as: BUSPAR Take 5 mg by mouth 2 (two) times daily as needed (anxiety).   CALCIUM 1200+D3 PO Take 1 tablet by mouth daily.   cetirizine 10 MG tablet Commonly known as: ZYRTEC Take 10 mg by mouth daily as needed for allergies.   CoQ10 30 MG Caps Take 30 mg by mouth daily.   denosumab 60 MG/ML Sosy injection Commonly known as: PROLIA Inject 60 mg into the skin every 6 (six) months.   Folbee Plus CZ 5 MG Tabs Take 1 tablet by mouth daily.   hydrocortisone 25 MG suppository Commonly known as: ANUSOL-HC Place 25 mg rectally 2 (two)  times daily as needed for hemorrhoids or anal itching.   nortriptyline 10 MG capsule Commonly known as: PAMELOR Take 20 mg by mouth at bedtime.   Omega-3 1000 MG Caps Take 1 tablet by mouth daily.   ondansetron 8 MG disintegrating tablet Commonly known as: ZOFRAN-ODT Take 8 mg by mouth every 8 (eight) hours as needed for nausea or vomiting.   potassium chloride 10 MEQ tablet Commonly known as: K-DUR Take 10 mEq by mouth 2 (two) times daily.   psyllium 58.6 % packet Commonly known as: METAMUCIL Take 1 packet by mouth daily.   RABEprazole 20 MG tablet Commonly known as: ACIPHEX Take 20 mg by mouth daily.      Follow-up Information    Lester CarolinaMcFadden, John C., MD Follow up.   Specialty: Family Medicine Contact information: [address] [place] [address] [phone]            Time coordinating discharge: 35 minutes.  SignedTrula Ore:  Noe Pittsley  Pager [phone] [facility] 02/24/2019, 2:30 PM

## 2019-02-24 NOTE — Progress Notes (Signed)
Spoke with patient's daughter Arbie Cookey this AM. States her mother is normally not confused at home, however she can have periods of forgetfulness and needs reminded about dates and appointments. The daughter states she has helpers 2 days a week to help with cleaning and other tasks at home.  Patient is refusing to eat breakfast at this time, stating she is "so full" and "I just had ice cream". She is also refusing medications stating she doesn't take them unless the doctor gives them to her. Pt spoke to daughter on the phone who encouraged her and helped reorient her. This RN attempted to reorient the patient and educate regarding medications, but there is no evidence of learning. Pt also asked about needing to void/ and self catheterization, and states "I don't need to right now".

## 2019-02-24 NOTE — Progress Notes (Addendum)
Inpatient Rehabilitation Admissions Coordinator  I met with patient and RN, Doroteo Bradford, at bedside. Daughter, Arbie Cookey, on her way to visit with patient. I will meet with daughter upon her arrival to assess for rehab and venue options.  Danne Baxter, RN, MSN Rehab Admissions Coordinator [phone] 02/24/2019 11:02 AM   Pt's son, Eddie Dibbles, returned my call and have asked me to meet directly with Arbie Cookey, [place] , upon her arrival who will be managing his Mom's options.

## 2019-02-24 NOTE — Progress Notes (Signed)
Spoke with daughter Arbie Cookey re: pt's care and her behavior thus far today.  Said she will be here very soon to stay the day and night with her mom.  Expressed thankfulness for the care she has been given and for the updates.

## 2019-02-24 NOTE — Progress Notes (Signed)
Pt refused all her po medications during shift. Reported off to oncoming RN. Delia Heady RN

## 2019-02-24 NOTE — Progress Notes (Signed)
Inpatient Rehabilitation Admissions Coordinator  Notified by Larina Bras, Inpatient Rehab Director, that daughter in law, Remo Lipps requesting an inpt rehab consult. I have placed call to pt's son, Eddie Dibbles, to begin discussions of rehab venue options as well as contacted Dr. Rockne Menghini for CIR consult.  Danne Baxter, RN, MSN Rehab Admissions Coordinator [phone] 02/24/2019 8:58 AM

## 2019-02-24 NOTE — Progress Notes (Signed)
Progress Note    Renee Fritz  BZJ:[id number] DOB: [birth year]  DOA: 02/21/2019 PCP: Lester CarolinaMcFadden, John C., MD    Brief Narrative:   Chief complaint: Follow-up stroke.  Medical records reviewed and are as summarized below:  Renee Fritz is an 85 y.o. female with a PMH of hypertension, osteoporosis and ulcerative colitis who was admitted 02/21/2019 by neurology for treatment of an acute right parietofrontal intracerebral hemorrhage with mild surrounding edema, felt to be from a hypertensive etiology.  Noted to have a left gaze palsy, left homonymous hemianopsia and left-sided weakness.  Speech therapy noted dysphasia as well.  Assessment/Plan:   Principal Problem:   ICH (intracerebral hemorrhage) (HCC) in gaze palsy, homonymous hemianopsia, left hemiparesis and left-sided neglect Admitted by the stroke service.  CT/MRI scans personally reviewed and shows a large right hemispheric infarct with hemorrhagic transformation.  The echo completed 02/23/2019, no embolic source.  EF 60-65% with mild diastolic dysfunction.  EKG showed normal sinus rhythm.  Now, medically stable and will need rehabilitation at a skilled facility.  PT/OT/speech therapy following.  Hemoglobin A1c 5.6 and LDL 56.  Will need a 30-day event monitor.    Active Problems:   Essential hypertension Continue Norvasc, hydrochlorothiazide and atenolol.  Continue metoprolol as needed.    Hyperlipidemia Vytorin currently on hold secondary to intracerebral hemorrhage.    Dysphasia due to recent stroke Speech therapy following.  Continue diet per recommendations.    Ulcerative colitis (HCC) Continue mesalamine.    MRSA carrier Continue chlorhexidine baths.   Body mass index is 18 kg/m.   Family Communication/Anticipated D/C date and plan/Code Status   DVT prophylaxis: SCDs ordered. Code Status: Full Code.  Family Communication: Daughter/son updated. Disposition Plan: CIR vs. SNF depending on eligibility to go to  CIR. Consult pending.   Medical Consultants:    Neurology  CIR   Anti-Infectives:    None  Subjective:   [name] continues to have left hemi-neglect and exhibits some inattention to conversation. Bowels moved yesterday per nursing, but patient concerned about drinking enough water to maintain regularity. No chest pain or dyspnea. Nursing staff reports she is refusing her meds.  Objective:    Vitals:   02/24/19 0613 02/24/19 0700 02/24/19 0829 02/24/19 0952  BP: 108/76 110/70  112/66  Pulse: (!) 107 79  95  Resp: 20 20  20   Temp:  98.3 F (36.8 C)    TempSrc:  Axillary    SpO2: 94% (!) 85% 95% 96%  Weight:      Height:        Intake/Output Summary (Last 24 hours) at 02/24/2019 1024 Last data filed at 02/24/2019 0230 Gross per 24 hour  Intake 80 ml  Output 1100 ml  Net -1020 ml   Filed Weights   02/21/19 1451 02/21/19 2114  Weight: 43.5 kg 41.8 kg    Exam: General: No acute distress. Cardiovascular: Heart sounds show a regular rate, and rhythm. No gallops or rubs. No murmurs. No JVD. Lungs: Clear to auscultation bilaterally with good air movement. No rales, rhonchi or wheezes. Abdomen: Soft, nontender, nondistended with normal active bowel sounds. No masses. No hepatosplenomegaly. Neurological: Alert and oriented 2. Left hemiparesis, gaze palsy, left homonymous hemianopsia. Skin: Warm and dry. Bruise left forehead and scattered UE/LE ecchymosis. Extremities: No clubbing or cyanosis. No edema. Pedal pulses 2+. Psychiatric: Mood and affect are normal. Insight and judgment are impaired.   Data Reviewed:   I have personally reviewed following labs and imaging studies:  Labs: Labs show the following:   Basic Metabolic Panel: Recent Labs  Lab 02/21/19 1533  NA 137  K 4.0  CL 103  CO2 26  GLUCOSE 97  BUN 13  CREATININE 0.58  CALCIUM 8.8*   GFR Estimated Creatinine Clearance: 30.2 mL/min (by C-G formula based on SCr of 0.58 mg/dL). Liver  Function Tests: Recent Labs  Lab 02/21/19 1533  AST 28  ALT 17  ALKPHOS 74  BILITOT 0.6  PROT 7.2  ALBUMIN 3.9   CBC: Recent Labs  Lab 02/21/19 1533  WBC 6.8  NEUTROABS 4.8  HGB 12.2  HCT 37.6  MCV 92.6  PLT 260   CBG: Recent Labs  Lab 02/21/19 1534 02/21/19 2103 02/23/19 0740  GLUCAP 94 108* 85   Hgb A1c: Recent Labs    02/24/19 0749  HGBA1C 5.6   Lipid Profile: Recent Labs    02/24/19 0749  CHOL 106  HDL 36*  LDLCALC 56  TRIG 69  CHOLHDL 2.9    Microbiology Recent Results (from the past 240 hour(s))  SARS Coronavirus 2 (Hosp order,Performed in [facility] lab via [employer] ID)     Status: None   Collection Time: 02/21/19  5:12 PM   Specimen: Dry Nasal Swab (Abbott ID Now)  Result Value Ref Range Status   SARS Coronavirus 2 (Abbott ID Now) NEGATIVE NEGATIVE Final    Comment: (NOTE) Interpretive Result Comment(s): COVID 19 Positive SARS CoV 2 target nucleic acids are DETECTED. The SARS CoV 2 RNA is generally detectable in upper and lower respiratory specimens during the acute phase of infection.  Positive results are indicative of active infection with SARS CoV 2.  Clinical correlation with patient history and other diagnostic information is necessary to determine patient infection status.  Positive results do not rule out bacterial infection or coinfection with other viruses. The expected result is Negative. COVID 19 Negative SARS CoV 2 target nucleic acids are NOT DETECTED. The SARS CoV 2 RNA is generally detectable in upper and lower respiratory specimens during the acute phase of infection.  Negative results do not preclude SARS CoV 2 infection, do not rule out coinfections with other pathogens, and should not be used as the sole basis for treatment or other patient management decisions.  Negative results must be combined with clinical  observations, patient history, and epidemiological information. The expected result is Negative. Invalid  Presence or absence of SARS CoV 2 nucleic acids cannot be determined. Repeat testing was performed on the submitted specimen and repeated Invalid results were obtained.  If clinically indicated, additional testing on a new specimen with an alternate test methodology [phone]) is advised.  The SARS CoV 2 RNA is generally detectable in upper and lower respiratory specimens during the acute phase of infection. The expected result is Negative. Fact Sheet for Patients:  http://www.graves-ford.org/https://www.fda.gov/media/136524/download Fact Sheet for Healthcare Providers: En[employer]Concern.sihttps://www.fda.gov/media/136523/download This test is not yet approved or cleared by the [place] FDA and has been authorized for detection and/or diagnosis of SARS CoV 2 by FDA under an Emergency Use Authorization (EUA).  This EUA will remain in effect (meaning this test can be used) for the duration of the COVID19 d eclaration under Section 564(b)(1) of the Act, 21 U.S.C. section [phone] 3(b)(1), unless the authorization is terminated or revoked sooner. Performed at [facility] [place], [address] [place], [place] [address]   MRSA PCR Screening     Status: Abnormal   Collection Time: 02/21/19  9:18 PM   Specimen:  Nasal Mucosa; Nasopharyngeal  Result Value Ref Range Status   MRSA by PCR POSITIVE (A) NEGATIVE Final    Comment: CRITICAL RESULT CALLED TO, READ BACK BY AND VERIFIED WITH: RNBrandt Loosen [id number] @0121  THANEY Performed at [facility] [address], [place],  [address]     Procedures and diagnostic studies:  Mr Jeri Cos UU Contrast  Result Date: 02/22/2019 CLINICAL DATA:  Intracranial hemorrhage.  Continued surveillance. EXAM: MRI HEAD WITHOUT AND WITH CONTRAST TECHNIQUE: Multiplanar, multiecho pulse sequences of the brain and surrounding structures were obtained without and with intravenous contrast. CONTRAST:  Gadavist 8 mL COMPARISON:  CT head 02/21/2019 FINDINGS: There is significant motion  degradation. The study is of marginal diagnostic utility. Brain: Large RIGHT parietal hemorrhage redemonstrated, not clearly increased in size from priors. Surrounding edema. Peripheral restriction, likely susceptibility related although medial RIGHT parietal gyral restriction could represent acute ischemia; see series 3, image 39. Punctate focus of low level restriction RIGHT occipital lobe. Generalized atrophy. Extensive T2 and FLAIR hyperintensities in the white matter, likely small vessel disease. No definable abnormal postcontrast enhancement, when correlated with precontrast T1 weighted images, see for instance precontrast series 9, image 60 compared with postcontrast series 11, image 34. Vascular: Unable to assess.  Grossly negative. Skull and upper cervical spine: Grossly negative. Sinuses/Orbits: Grossly negative. Other: Grossly negative. IMPRESSION: Motion degraded study of marginal diagnostic utility demonstrates a large RIGHT parietal hemorrhage, not clearly increased in size from priors. No visible midline shift. Abnormal signal on diffusion-weighted imaging is largely related to susceptibility artifact, but there may be gyriform like acute infarction of the medial RIGHT parietal lobe, as well as a punctate focus of acute infarction in the RIGHT occipital lobe. See discussion above. There is no visible abnormal postcontrast enhancement to suggest underlying mass, when axial precontrast T1 weighted images are compared with those postcontrast. Consider repeat MRI brain in 6 weeks, when there has been partial involution of the hemorrhage, as well as hopefully improvement in the clinical status so that the patient might remain motionless. Electronically Signed   By: Staci Righter M.D.   On: 02/22/2019 17:43    Medications:   .  stroke: mapping our early stages of recovery book   Does not apply Once  . amLODipine  5 mg Oral Daily  . atenolol  100 mg Oral BID  . busPIRone  5 mg Oral BID  .  Chlorhexidine Gluconate Cloth  6 each Topical Q0600  . hydrochlorothiazide  25 mg Oral Daily  . hydrocortisone  25 mg Rectal BID  . mouth rinse  15 mL Mouth Rinse BID  . Mesalamine  800 mg Oral TID WC  . mupirocin ointment  1 application Nasal BID  . nortriptyline  20 mg Oral QHS  . pantoprazole  40 mg Oral QHS  . potassium chloride  10 mEq Oral BID  . senna-docusate  1 tablet Oral BID   Continuous Infusions:   LOS: 3 days   Jacquelynn Cree  [facility] Pager [phone].   *Please refer to [email or web address], password [email or web address] to get updated schedule on who will round on this patient, as hospitalists switch teams weekly. If 7PM-7AM, please contact night-coverage at [email or web address], password [email or web address] for any overnight needs.  02/24/2019, 10:24 AM

## 2019-02-24 NOTE — Progress Notes (Signed)
Physical Therapy Treatment Patient Details Name: Renee JewelsDorothy Werntz MRN: [id number] DOB: Apr 30, 1926 Today's Date: 02/24/2019    History of Present Illness Pt adm with AMS,  CT head revealed a 34 mL right parietofrontal acute ICH.  PMH - ulcerative colitis, HTN, cad, htn, femoral hernia repair    PT Comments    Patient seen for mobility progression. Patient requires increased verbal and tactile cueing for arousal - once sitting able to have a discussion about her dentist. Patient requiring general Mod A for functional mobility - poor awareness of L side with noted inattention. Requires Mod A for RW management and steadying with gait. PT to continue to follow acutely.     Follow Up Recommendations  CIR(vs SNF)     Equipment Recommendations  Other (comment)(TBD)    Recommendations for Other Services       Precautions / Restrictions Precautions Precautions: Fall;Other (comment) Precaution Comments: lt neglect, field cut Restrictions Weight Bearing Restrictions: No    Mobility  Bed Mobility Overal bed mobility: Needs Assistance Bed Mobility: Supine to Sit     Supine to sit: Mod assist;Max assist     General bed mobility comments: required assist to guide LE to EOB as well as trunk control to come into sitting  Transfers Overall transfer level: Needs assistance Equipment used: Rolling walker (2 wheeled) Transfers: Sit to/from [employer] CorporationStand;Stand Pivot Transfers Sit to Stand: Min assist Stand pivot transfers: Mod assist       General transfer comment: assist to stand and steady, min assist to transfer R, mod to transfer L due to neglect  Ambulation/Gait Ambulation/Gait assistance: Mod assist Gait Distance (Feet): 80 Feet Assistive device: Rolling walker (2 wheeled) Gait Pattern/deviations: Step-to pattern;Decreased stride length;Drifts right/left;Trunk flexed Gait velocity: decr   General Gait Details: unable to navigate RW without significant assist from PT - tendency to turn  L throughout mobility; does not scan environment; up to Mod A for steadying   Stairs             Wheelchair Mobility    Modified Rankin (Stroke Patients Only) Modified Rankin (Stroke Patients Only) Pre-Morbid Rankin Score: No significant disability Modified Rankin: Moderately severe disability     Balance Overall balance assessment: Needs assistance Sitting-balance support: Single extremity supported;Feet supported Sitting balance-Leahy Scale: Fair     Standing balance support: Bilateral upper extremity supported;During functional activity Standing balance-Leahy Scale: Poor Standing balance comment: min assist for static standing                            Cognition Arousal/Alertness: Awake/alert Behavior During Therapy: Flat affect Overall Cognitive Status: Impaired/Different from baseline Area of Impairment: Orientation;Attention;Memory;Following commands;Safety/judgement;Awareness;Problem solving                 Orientation Level: Disoriented to;Time;Situation;Place Current Attention Level: Focused Memory: Decreased short-term memory Following Commands: Follows one step commands inconsistently;Follows one step commands with increased time Safety/Judgement: Decreased awareness of safety;Decreased awareness of deficits Awareness: Intellectual Problem Solving: Requires verbal cues;Requires tactile cues;Difficulty sequencing;Slow processing General Comments: Pt with significant lt neglect. Difficulty attending to any task or question      Exercises      General Comments        Pertinent Vitals/Pain Pain Assessment: No/denies pain    Home Living                      Prior Function  PT Goals (current goals can now be found in the care plan section) Acute Rehab PT Goals Patient Stated Goal: see her cat PT Goal Formulation: With family Time For Goal Achievement: 03/09/19 Potential to Achieve Goals: Fair Progress  towards PT goals: Progressing toward goals    Frequency    Min 3X/week      PT Plan Current plan remains appropriate    Co-evaluation              AM-PAC PT "6 Clicks" Mobility   Outcome Measure  Help needed turning from your back to your side while in a flat bed without using bedrails?: A Lot Help needed moving from lying on your back to sitting on the side of a flat bed without using bedrails?: A Lot Help needed moving to and from a bed to a chair (including a wheelchair)?: A Lot Help needed standing up from a chair using your arms (e.g., wheelchair or bedside chair)?: A Little Help needed to walk in hospital room?: A Lot Help needed climbing 3-5 steps with a railing? : Total 6 Click Score: 12    End of Session Equipment Utilized During Treatment: Gait belt Activity Tolerance: Patient tolerated treatment well Patient left: in chair;with call bell/phone within reach;with chair alarm set;Other (comment) Nurse Communication: Mobility status PT Visit Diagnosis: Unsteadiness on feet (R26.81);Other abnormalities of gait and mobility (R26.89);Other symptoms and signs involving the nervous system (Q76.195)     Time: 0932-6712 PT Time Calculation (min) (ACUTE ONLY): 18 min  Charges:  $Gait Training: 8-22 mins                      Lanney Gins, PT, DPT Supplemental Physical Therapist 02/24/19 1:07 PM Pager: [phone] Office: [phone]

## 2019-02-24 NOTE — Progress Notes (Addendum)
Patient arrived to unit, no s/s of distress. No complications noted at this time.  Tony Granquist D Pamella Samons, LPN 

## 2019-02-24 NOTE — Care Management Important Message (Signed)
Important Message  Patient Details  Name: Carleta Woodrow MRN: [id number] Date of Birth: [birth year]   Medicare Important Message Given:  Yes  Due to illness patient was not able to sign.  Unsigned copy left.   Jareth Pardee 02/24/2019, 11:58 AM

## 2019-02-24 NOTE — PMR Pre-admission (Signed)
PMR Admission Coordinator Pre-Admission Assessment  Patient: Renee Fritz is an 85 y.o., female MRN: [id number] DOB: 1926/03/16 Height: 5' (152.4 cm) Weight: 41.8 kg  Insurance Information HMO:     PPO:      PCP:      IPA:      80/20:      OTHER: no HMO PRIMARY: Medicare a and b      Policy#: [id number]      Subscriber: pt Benefits:  Phone #: passport one online     Name: 6/23 Eff. Date: 02/01/1993     Deduct: $1408      Out of Pocket Max: none      Life Max: none CIR: 100%      SNF: 20 full days Outpatient: 80%     Co-Pay: 20% Home Health: 100%      Co-Pay: none DME: 80%     Co-Pay: 20% Providers: pt choice  SECONDARY: Tricare for Life      Policy#: [id number]      Subscriber: pt  Medicaid Application Date:       Case Manager:  Disability Application Date:       Case Worker:   The "Data Collection Information Summary" for patients in Inpatient Rehabilitation Facilities with attached "Privacy Act [place] Records" was provided and verbally reviewed with: Family  Emergency Contact Information Contact Information    Name Relation Home Work Mobile   Fritz,Renee  [phone]     Renee, Fritz [phone]     Renee, Fritz  [phone]        Current Medical History  Patient Admitting Diagnosis: CVA  History of Present Illness:Renee Fritz is a 85 year old right-handed female with history of hypertension, ulcerative colitis, neurogenic bladder 2007 after a fall followed by urology services Dr. Venia Minks [facility] urology center.  Presented 02/21/2019 with altered mental status.  Son had reported she recently stumbled and struck her left forehead into the wall.  Cranial CT scan showed acute right parietal hemorrhage 41 x 33 x 46 mm with mild surrounding edema but no right to left shift or intraventricular extension.  Slight subarachnoid blood.  CT cervical spine negative.  CT angiogram of the head with no large vessel occlusion.  Follow-up MRI again shows right parietal  hemorrhage not increased in size from prior tracings.  No visible midline shift.  Neurology follow-up with conservative care.  Tolerating a regular diet.    Patient's medical record from [facility] has been reviewed by the rehabilitation admission coordinator and physician.  Past Medical History  Past Medical History:  Diagnosis Date  . Hypertension   . Incarcerated femoral hernia 07/03/2017  . Osteoporosis   . Other specified cardiac arrhythmias   . Pneumonia   . Ulcerative (chronic) enterocolitis ([place])     Family History   Family history is unknown by patient.  Prior Rehab/Hospitalizations Has the patient had prior rehab or hospitalizations prior to admission? Yes 2007 was at [facility]  Has the patient had major surgery during 100 days prior to admission? No   Current Medications  Current Facility-Administered Medications:  .   stroke: mapping our early stages of recovery book, , Does not apply, Once, Donzetta Starch, NP .  acetaminophen (TYLENOL) tablet 650 mg, 650 mg, Oral, Q4H PRN, 650 mg at 02/22/19 2230 **OR** acetaminophen (TYLENOL) solution 650 mg, 650 mg, Per Tube, Q4H PRN **OR** acetaminophen (TYLENOL) suppository 650 mg, 650 mg, Rectal, Q4H PRN, Burnetta Sabin L, NP .  amLODipine (  NORVASC) tablet 5 mg, 5 mg, Oral, Daily, Biby, Sharon L, NP, 5 mg at 02/24/19 1001 .  atenolol (TENORMIN) tablet 100 mg, 100 mg, Oral, BID, Biby, Sharon L, NP, 100 mg at 02/24/19 1004 .  busPIRone (BUSPAR) tablet 5 mg, 5 mg, Oral, BID, Biby, Sharon L, NP, 5 mg at 02/23/19 0837 .  Chlorhexidine Gluconate Cloth 2 % PADS 6 each, 6 each, Topical, Q0600, Biby, Sharon L, NP, 6 each at 02/24/19 1011 .  hydrochlorothiazide (HYDRODIURIL) tablet 25 mg, 25 mg, Oral, Daily, Biby, Sharon L, NP, 25 mg at 02/23/19 0837 .  hydrocortisone (ANUSOL-HC) suppository 25 mg, 25 mg, Rectal, BID, Biby, Sharon L, NP, 25 mg at 02/23/19 2125 .  MEDLINE mouth rinse, 15 mL, Mouth Rinse, BID, Biby,  Sharon L, NP, 15 mL at 02/24/19 0944 .  Mesalamine (ASACOL) DR capsule 800 mg, 800 mg, Oral, TID WC, Biby, Sharon L, NP, 800 mg at 02/24/19 1348 .  metoprolol tartrate (LOPRESSOR) injection 2.5 mg, 2.5 mg, Intravenous, Q5 min PRN, Greta Doom, MD, 2.5 mg at 02/24/19 0615 .  mupirocin ointment (BACTROBAN) 2 % 1 application, 1 application, Nasal, BID, Rama, Venetia Maxon, MD, 1 application at 54/00/86 0947 .  nortriptyline (PAMELOR) capsule 20 mg, 20 mg, Oral, QHS, Biby, Sharon L, NP, 20 mg at 02/22/19 2127 .  pantoprazole (PROTONIX) EC tablet 40 mg, 40 mg, Oral, QHS, Rama, Christina P, MD .  potassium chloride 20 MEQ/15ML (10%) solution 10 mEq, 10 mEq, Oral, BID, Biby, Sharon L, NP, 10 mEq at 02/24/19 1007 .  senna-docusate (Senokot-S) tablet 1 tablet, 1 tablet, Oral, BID, Biby, Sharon L, NP, 1 tablet at 02/24/19 1006  Patients Current Diet:  Diet Order            Diet - low sodium heart healthy        Diet regular Room service appropriate? Yes; Fluid consistency: Thin  Diet effective now              Precautions / Restrictions Precautions Precautions: Fall, Other (comment) Precaution Comments: lt neglect, field cut Restrictions Weight Bearing Restrictions: No   Has the patient had 2 or more falls or a fall with injury in the past year? Yes  Prior Activity Level Limited Community (1-2x/wk): Mod I with cane, aide twice week  Prior Functional Level Self Care: Did the patient need help bathing, dressing, using the toilet or eating? Needed some help  Indoor Mobility: Did the patient need assistance with walking from room to room (with or without device)? Independent  Stairs: Did the patient need assistance with internal or external stairs (with or without device)? Independent  Functional Cognition: Did the patient need help planning regular tasks such as shopping or remembering to take medications? Needed some help  Home Assistive Devices / [place]  Devices/Equipment: None Home Equipment: Grab bars - tub/shower, Environmental consultant - 2 wheels, Cane - single point  Prior Device Use: Indicate devices/aids used by the patient prior to current illness, exacerbation or injury? cane  Current Functional Level Cognition  Arousal/Alertness: Awake/alert Overall Cognitive Status: Impaired/Different from baseline Current Attention Level: Focused Orientation Level: Oriented to person, Oriented to place, Disoriented to time, Disoriented to situation Following Commands: Follows one step commands inconsistently, Follows one step commands with increased time Safety/Judgement: Decreased awareness of safety, Decreased awareness of deficits General Comments: Pt with significant lt neglect. Difficulty attending to any task or question Attention: Focused, Sustained, Selective, Alternating Focused Attention: Appears intact Sustained Attention: Impaired Sustained  Attention Impairment: Verbal basic, Functional basic Selective Attention: Impaired Selective Attention Impairment: Verbal basic, Functional basic Alternating Attention: Impaired Alternating Attention Impairment: Verbal basic, Functional basic Memory: Impaired Memory Impairment: Storage deficit Awareness: Impaired Awareness Impairment: Intellectual impairment, Emergent impairment(better anticipatory awareness than other ) Problem Solving: Impaired Problem Solving Impairment: Functional basic Executive Function: Self Monitoring Self Monitoring: Impaired Self Monitoring Impairment: Verbal basic Safety/Judgment: Impaired    Extremity Assessment (includes Sensation/Coordination)  Upper Extremity Assessment: LUE deficits/detail, RUE deficits/detail RUE Deficits / Details: arthritic changes in hand, difficult to formally assess due to cognition, appears grossly intact LUE Deficits / Details: arthritic changes in hand, maintains in flexion in lap, no functional use elicited LUE Coordination: decreased fine  motor, decreased gross motor  Lower Extremity Assessment: Defer to PT evaluation LLE Deficits / Details: functionally weaker than rt. Neglect.  LLE Sensation: decreased light touch    ADLs  Overall ADL's : Needs assistance/impaired Eating/Feeding: Minimal assistance, Sitting Eating/Feeding Details (indicate cue type and reason): ate ice cream with spoon with assist to stabilize cup, drank with straw and use of R UE Grooming: Wash/dry hands, Sitting, Moderate assistance Grooming Details (indicate cue type and reason): decreased thoroughness, but used wash cloth appropriately Upper Body Bathing: Maximal assistance, Sitting Lower Body Bathing: Maximal assistance, Sit to/from stand Upper Body Dressing : Maximal assistance, Sitting Lower Body Dressing: Maximal assistance, Sit to/from stand Toilet Transfer: Moderate assistance, Stand-pivot Toileting- Clothing Manipulation and Hygiene: Maximal assistance, Sit to/from stand    Mobility  Overal bed mobility: Needs Assistance Bed Mobility: Supine to Sit Supine to sit: Mod assist, Max assist General bed mobility comments: required assist to guide LE to EOB as well as trunk control to come into sitting    Transfers  Overall transfer level: Needs assistance Equipment used: Rolling walker (2 wheeled) Transfers: Sit to/from Stand, Stand Pivot Transfers Sit to Stand: Min assist Stand pivot transfers: Mod assist General transfer comment: assist to stand and steady, min assist to transfer R, mod to transfer L due to neglect    Ambulation / Gait / Stairs / Wheelchair Mobility  Ambulation/Gait Ambulation/Gait assistance: Mod assist Gait Distance (Feet): 80 Feet Assistive device: Rolling walker (2 wheeled) Gait Pattern/deviations: Step-to pattern, Decreased stride length, Drifts right/left, Trunk flexed General Gait Details: unable to navigate RW without significant assist from PT - tendency to turn L throughout mobility; does not scan environment;  up to Mod A for steadying Gait velocity: decr Gait velocity interpretation: <1.31 ft/sec, indicative of household ambulator    Posture / Balance Dynamic Sitting Balance Sitting balance - Comments: Sat EOB with supervision x 10 minutes Balance Overall balance assessment: Needs assistance Sitting-balance support: Single extremity supported, Feet supported Sitting balance-Leahy Scale: Fair Sitting balance - Comments: Sat EOB with supervision x 10 minutes Standing balance support: Bilateral upper extremity supported, During functional activity Standing balance-Leahy Scale: Poor Standing balance comment: min assist for static standing    Special needs/care consideration BiPAP/CPAP  N/a CPM  N/a Continuous Drip IV  N/a Dialysis n/a Life Vest  N/a Oxygen  N/a Special Bed  Low bed with floor pads due to high fall risk Trach Size  N/a Wound Vac n/a Skin  Ecchymosis to left forehead, left anterior arm skin tear; ecchymosis to BUE, stage 1 to back 3 x 0.8 x 0 Bowel mgmt: continent LBM 6/23 Bladder mgmt: self catheters QID at home Diabetic mgmt: n/a Behavioral consideration high fall risk with decreased safety awareness; increased confusion since admit. Daughter, Arbie Cookey, has approval beginning  6/23 to be visitor Chemo/radiation  N/a   Previous Home Environment  Living Arrangements: Alone  Lives With: Alone Available Help at Discharge: Available PRN/intermittently Type of Home: House Home Layout: Two level, Bed/bath upstairs Alternate Level Stairs-Rails: Right Alternate Level Stairs-Number of Steps: 1 flight Home Access: Stairs to enter Entrance Stairs-Rails: Right, Left Bathroom Shower/Tub: Chiropodist: Standard Bathroom Accessibility: Yes How Accessible: Accessible via walker Home Care Services: Yes Type of Home Care Services: Homehealth aide Additional Comments: Pt performs self cath independently  Discharge Living Setting Plans for Discharge Living Setting:  Other (Comment)(patietn has been planning move to [facility] to senior living or) Type of Home at Discharge: [place] Name at Discharge: daughter and patient had been touring before COVID  Does the patient have any problems obtaining your medications?: No  Daughter plans d/c to ALF in [place] area at d/c. This is Cindie Laroche mother in law BUT, Arbie Cookey is NOK/POA contact  Social/Family/Support Systems Patient Roles: Parent Contact Information: Jana Half, daughter and POA Anticipated Caregiver: ALF to be determined based on her care needs after CIR Anticipated Caregiver's Contact Information: see above Ability/Limitations of Caregiver: intermittent assist Caregiver Availability: (ALF) Discharge Plan Discussed with Primary Caregiver: Yes Is Caregiver In Agreement with Plan?: Yes Does Caregiver/Family have Issues with Lodging/Transportation while Pt is in Rehab?: Yes(Renee, daughter has been approved to be one visitor for hosp)  Goals/Additional Needs Patient/Family Goal for Rehab: Mod I to superivison with PT, OT and SLP Expected length of stay: ELOS 14 to 20 days  Decrease burden of Care through IP rehab admission: n/a  Possible need for SNF placement upon discharge:  ALF after CIR  Patient Condition: I have reviewed medical records from [facility] , spoken with CM, and patient and daughter. I met with patient at the bedside for inpatient rehabilitation assessment.  Patient will benefit from ongoing PT, OT and SLP, can actively participate in 3 hours of therapy a day 5 days of the week, and can make measurable gains during the admission.  Patient will also benefit from the coordinated team approach during an Inpatient Acute Rehabilitation admission.  The patient will receive intensive therapy as well as Rehabilitation physician, nursing, social worker, and care management interventions.  Due to bladder management, bowel management, safety, skin/wound care, disease  management, medication administration, pain management and patient education the patient requires 24 hour a day rehabilitation nursing.  The patient is currently min to mod assist with mobility and basic ADLs.  Discharge setting and therapy post discharge at assisted living facility is anticipated.  Patient has agreed to participate in the Acute Inpatient Rehabilitation Program and will admit today.  Preadmission Screen Completed By:  Cleatrice Burke RN MSN, 02/24/2019 2:35 PM ______________________________________________________________________   Discussed status with Dr. Naaman Plummer  on  02/24/2019 at  1444 and received approval for admission today.  Admission Coordinator:  Cleatrice Burke, RN, time  8016 Date  02/24/2019   Assessment/Plan: Diagnosis: right parietal ICH with IVH 1. Does the need for close, 24 hr/day Medical supervision in concert with the patient's rehab needs make it unreasonable for this patient to be served in a less intensive setting? Yes 2. Co-Morbidities requiring supervision/potential complications: HTN 3. Due to bladder management, bowel management, safety, skin/wound care, disease management, medication administration, pain management and patient education, does the patient require 24 hr/day rehab nursing? Yes 4. Does the patient require coordinated care of a physician, rehab nurse, PT (1-2 hrs/day, 5 days/week),  OT (1-2 hrs/day, 5 days/week) and SLP (1-2 hrs/day, 5 days/week) to address physical and functional deficits in the context of the above medical diagnosis(es)? Yes Addressing deficits in the following areas: balance, endurance, locomotion, strength, transferring, bowel/bladder control, bathing, dressing, feeding, grooming, toileting, cognition, speech and psychosocial support 5. Can the patient actively participate in an intensive therapy program of at least 3 hrs of therapy 5 days a week? Yes 6. The potential for patient to make measurable gains while on  inpatient rehab is excellent 7. Anticipated functional outcomes upon discharge from inpatients are: modified independent and supervision PT, modified independent and supervision OT, modified independent and supervision SLP 8. Estimated rehab length of stay to reach the above functional goals is: 14-20 days 9. Anticipated D/C setting: Home 10. Anticipated post D/C treatments: [place] therapy 11. Overall Rehab/Functional Prognosis: excellent  MD Signature: Meredith Staggers, MD, [facility] 02/24/2019

## 2019-02-25 ENCOUNTER — Inpatient Hospital Stay (HOSPITAL_COMMUNITY): Payer: Medicare Other | Admitting: Physical Therapy

## 2019-02-25 ENCOUNTER — Inpatient Hospital Stay (HOSPITAL_COMMUNITY): Payer: Medicare Other

## 2019-02-25 ENCOUNTER — Inpatient Hospital Stay (HOSPITAL_COMMUNITY): Payer: Medicare Other | Admitting: Speech Pathology

## 2019-02-25 DIAGNOSIS — I615 Nontraumatic intracerebral hemorrhage, intraventricular: Secondary | ICD-10-CM

## 2019-02-25 DIAGNOSIS — I69398 Other sequelae of cerebral infarction: Secondary | ICD-10-CM

## 2019-02-25 DIAGNOSIS — R269 Unspecified abnormalities of gait and mobility: Secondary | ICD-10-CM

## 2019-02-25 DIAGNOSIS — I69319 Unspecified symptoms and signs involving cognitive functions following cerebral infarction: Secondary | ICD-10-CM

## 2019-02-25 LAB — COMPREHENSIVE METABOLIC PANEL
ALT: 19 U/L (ref 0–44)
AST: 28 U/L (ref 15–41)
Albumin: 3.8 g/dL (ref 3.5–5.0)
Alkaline Phosphatase: 76 U/L (ref 38–126)
Anion gap: 13 (ref 5–15)
BUN: 28 mg/dL — ABNORMAL HIGH (ref 8–23)
CO2: 25 mmol/L (ref 22–32)
Calcium: 8.8 mg/dL — ABNORMAL LOW (ref 8.9–10.3)
Chloride: 96 mmol/L — ABNORMAL LOW (ref 98–111)
Creatinine, Ser: 0.95 mg/dL (ref 0.44–1.00)
GFR calc Af Amer: >60 mL/min (ref >60)
GFR calc non Af Amer: 52 mL/min — ABNORMAL LOW (ref >60)
Glucose, Bld: 104 mg/dL — ABNORMAL HIGH (ref 70–99)
Potassium: 3.6 mmol/L (ref 3.5–5.1)
Sodium: 134 mmol/L — ABNORMAL LOW (ref 135–145)
Total Bilirubin: 1.1 mg/dL (ref 0.3–1.2)
Total Protein: 7.3 g/dL (ref 6.5–8.1)

## 2019-02-25 LAB — CBC WITH DIFFERENTIAL/PLATELET
Abs Immature Granulocytes: 0.04 10*3/uL (ref 0.00–0.07)
Basophils Absolute: 0.1 10*3/uL (ref 0.0–0.1)
Basophils Relative: 1 %
Eosinophils Absolute: 0.1 10*3/uL (ref 0.0–0.5)
Eosinophils Relative: 1 %
HCT: 42.3 % (ref 36.0–46.0)
Hemoglobin: 14.2 g/dL (ref 12.0–15.0)
Immature Granulocytes: 0 %
Lymphocytes Relative: 18 %
Lymphs Abs: 1.9 10*3/uL (ref 0.7–4.0)
MCH: 29.9 pg (ref 26.0–34.0)
MCHC: 33.6 g/dL (ref 30.0–36.0)
MCV: 89.1 fL (ref 80.0–100.0)
Monocytes Absolute: 0.9 10*3/uL (ref 0.1–1.0)
Monocytes Relative: 8 %
Neutro Abs: 7.5 10*3/uL (ref 1.7–7.7)
Neutrophils Relative %: 72 %
Platelets: 321 10*3/uL (ref 150–400)
RBC: 4.75 MIL/uL (ref 3.87–5.11)
RDW: 13 % (ref 11.5–15.5)
WBC: 10.5 10*3/uL (ref 4.0–10.5)
nRBC: 0 % (ref 0.0–0.2)

## 2019-02-25 MED ORDER — ORAL CARE MOUTH RINSE
15.0000 mL | Freq: Two times a day (BID) | OROMUCOSAL | Status: DC
  Administered 2019-02-25 – 2019-03-03 (×8): 15 mL via OROMUCOSAL

## 2019-02-25 MED ORDER — ENSURE ENLIVE PO LIQD
237.0000 mL | Freq: Two times a day (BID) | ORAL | Status: DC
  Administered 2019-02-25 – 2019-03-01 (×4): 237 mL via ORAL

## 2019-02-25 MED ORDER — NORTRIPTYLINE HCL 10 MG PO CAPS
10.0000 mg | ORAL_CAPSULE | Freq: Every day | ORAL | Status: DC
  Administered 2019-02-25: 10 mg via ORAL
  Filled 2019-02-25: qty 1

## 2019-02-25 NOTE — Progress Notes (Signed)
Physical Therapy Session Note  Patient Details  Name: Renee Fritz MRN: [id number] Date of Birth: [birth year]  Today's Date: 02/25/2019 PT Individual Time: 7588-3254 PT Individual Time Calculation (min): 45 min   Short Term Goals: Week 1:  PT Short Term Goal 1 (Week 1): Pt will performed bed<>WC transfer with min assist and LRAD PT Short Term Goal 2 (Week 1): Pt will attend to L side 50% of the time to improve safety wihty functional mobility. PT Short Term Goal 3 (Week 1): Pt will ambulate 145ft with min assist and LRAD PT Short Term Goal 4 (Week 1): Pt will perform bed mobility with min assist consistently  Skilled Therapeutic Interventions/Progress Updates:  Pt received seated in w/c in room, attempting to call for assist to return to bed. Pt agreeable to participate in therapy session before laying down. Demonstration of how to perform w/c mobility. Pt requires mod A and hand-over-hand cueing to propel w/c x 25' with use of R UE/LE hemi technique. Sit to stand with mod A to R handrail. Attempt to have pt use mirror in standing for upright stance and attention to the L. Pt unable to follow cues and performs sit to stand x 5 reps with min to mod A, unable to follow cues to remain standing. Seated task attempting to have pt scan to the L to find target, pt unable to follow cues to turn her head to the L. Ambulation x 15 ft with RW and min A from w/c to bed. Sit to supine min A. Pt left sidelying in bed with needs in reach, bed alarm in place, telesitter present at end of session.  Therapy Documentation Precautions:  Restrictions Weight Bearing Restrictions: No    Therapy/Group: Individual Therapy   Excell Seltzer, PT, DPT  02/25/2019, 12:42 PM

## 2019-02-25 NOTE — Progress Notes (Signed)
Patient information reviewed and entered into eRehab System by Becky Hawley Pavia, PPS coordinator. Information including medical coding, function ability, and quality indicators will be reviewed and updated through discharge.   

## 2019-02-25 NOTE — Progress Notes (Signed)
Patient refused all 0800 oral medications this AM. This RN attempted to give oral medications in applesauce and patient choose to spit out medications instead of swallowing them. Patient stated, "I don't like applesauce and I'm not going to take it anymore." This RN attempted to give as little applesauce the medication as possible, but the patient still spit out the medication. She received no 0800 oral medications. MD was notified during team conference.

## 2019-02-25 NOTE — Progress Notes (Signed)
[place] PHYSICAL MEDICINE & REHABILITATION PROGRESS NOTE   Subjective/Complaints:  Somnolent but awakens to physical stim  ROS- unable to obtain due to mental status  Objective:   No results found. No results for input(s): WBC, HGB, HCT, PLT in the last 72 hours. No results for input(s): NA, K, CL, CO2, GLUCOSE, BUN, CREATININE, CALCIUM in the last 72 hours.  Intake/Output Summary (Last 24 hours) at 02/25/2019 0709 Last data filed at 02/25/2019 0018 Gross per 24 hour  Intake -  Output 350 ml  Net -350 ml     Physical Exam: Vital Signs Blood pressure (!) 145/85, pulse 84, temperature 98 F (36.7 C), temperature source Oral, resp. rate 19, weight 41.3 kg, SpO2 99 %.   General: No acute distress Mood and affect somnolent but awakens to physical stim  Heart: Regular rate and rhythm no rubs murmurs or extra sounds Lungs: Clear to auscultation, breathing unlabored, no rales or wheezes Abdomen: Positive bowel sounds, soft nontender to palpation, nondistended Extremities: No clubbing, cyanosis, or edema Skin: No evidence of breakdown, no evidence of rash Neurologic:will squeeze with BUE but not cooperating with MMT  Sensory exam withdraws to pinch with BUE and BLE Cerebellar exam not cooperating Musculoskeletal:  No joint swelling   Assessment/Plan: 1. 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  Physiatrist is providing close team supervision and 24 hour management of active medical problems listed below.  Physiatrist and rehab team continue to assess barriers to discharge/monitor patient progress toward functional and medical goals  Care Tool:  Bathing              Bathing assist       Upper Body Dressing/Undressing Upper body dressing        Upper body assist      Lower Body Dressing/Undressing Lower body dressing            Lower body  assist       Toileting Toileting    Toileting assist       Transfers Chair/bed transfer  Transfers assist     Chair/bed transfer assist level: Total Assistance - Patient < 25%     Locomotion Ambulation   Ambulation assist              Walk 10 feet activity   Assist           Walk 50 feet activity   Assist           Walk 150 feet activity   Assist           Walk 10 feet on uneven surface  activity   Assist           Wheelchair     Assist               Wheelchair 50 feet with 2 turns activity    Assist            Wheelchair 150 feet activity     Assist          Medical Problem List and Plan: 1.Decreased functional mobilitysecondary to likelytraumaticright parietalICH(versus hypertensive etiology) CIR PT, OT SLP evals  2. Antithrombotics: -DVT/anticoagulation:SCDs -antiplatelet therapy: N/A 3. Pain Management:Tylenol as needed 4. Mood:BuSpar 5 mg twice daily, Pamelor 20 mg nightly -antipsychotic agents: pt with difficulty sleeping at night with increased confusion and restlessness per staff/family. Begin low dose seroquel 12.5mg  qhs -keep sleep  chart, pt states she slept ok , sleep chart not completed , may have hangover effect from nortriptyline and seroquel will reduce dose of nortriptyline  -try to re-establish normal sleep-wake patterns. -pt has decompensated in hospital b/f per family  5. Neuropsych: This patientiscapable of making decisions on herown behalf. 6. Skin/Wound Care:Routine skin checks 7. Fluids/Electrolytes/Nutrition:Routine in and outs with follow-up chemistries 8. Hypertension. HCTZ 25 mg daily, Tenormin 100 mg twice daily, Norvasc 5 mg daily. Monitor with increased mobility Vitals:   02/24/19 1852 02/25/19 0520  BP: (!) 117/59 (!) 145/85  Pulse: 70 84   Resp: 20 19  Temp: 98.1 F (36.7 C) 98 F (36.7 C)  SpO2: 99% 99%  BP within desired range 9.Neurogenic bladder secondary to a fall 2007. Independent self caths. Followed by Dr. Venia Minks [facility] urology -will do I/O caths per RN until she improves neurologically 10. Ulcerative colitis. Mesalamine 800 mg 3 times daily 11. Hyperlipidemia. Vytorin currently on hold secondary to intracerebral hemorrhage    LOS: 1 days A FACE TO FACE EVALUATION WAS PERFORMED  Charlett Blake 02/25/2019, 7:09 AM

## 2019-02-25 NOTE — IPOC Note (Addendum)
Overall Plan of Care [facility]) Patient Details Name: Renee Fritz MRN: [id number] DOB: [birth year]  Admitting Diagnosis: <principal problem not specified>  Hospital Problems: Active Problems:   IVH (intraventricular hemorrhage) (HCC)     Functional Problem List: Nursing Skin Integrity, Safety, Bowel, Bladder, Behavior, Medication Management  PT Balance, Behavior, Safety, Edema, Endurance, Motor, Nutrition, Pain, Perception  OT Balance, Pain, Behavior, Perception, Cognition, Safety, Sensory, Endurance, Motor, Nutrition, Vision  SLP Cognition, Behavior, Endurance, Perception, Safety  TR         Basic ADL's: OT Eating, Grooming, Dressing, Bathing, Toileting     Advanced  ADL's: OT       Transfers: PT Bed Mobility, Bed to Chair, Car, Furniture, Floor  OT Toilet, Metallurgist: PT Ambulation, Emergency planning/management officer, Stairs     Additional Impairments: OT Fuctional Use of Upper Extremity  SLP Social Cognition   Social Interaction, Problem Solving, Memory, Attention, Awareness  TR      Anticipated Outcomes Item Anticipated Outcome  Self Feeding (S)  Swallowing      Basic self-care  (S)  Toileting  (S)   Bathroom Transfers (S)  Bowel/Bladder  Patient will be able to be continent of bowel and bladder  Transfers  Supervision assist with LRAD  Locomotion  Min-supervision assist with LRAD at ambulatory level  Communication     Cognition  Mod A for basic  Pain  Pain less than 3  Safety/Judgment  Adhere to safety plans   Therapy Plan: PT Intensity: Minimum of 1-2 x/day ,45 to 90 minutes PT Frequency: 5 out of 7 days PT Duration Estimated Length of Stay: 14-16 days OT Intensity: Minimum of 1-2 x/day, 45 to 90 minutes OT Frequency: 5 out of 7 days OT Duration/Estimated Length of Stay: 14-18 days SLP Intensity: Minumum of 1-2 x/day, 30 to 90 minutes SLP Frequency: 3 to 5 out of 7 days SLP Duration/Estimated Length of Stay: 2 to 2.5 weeks   Due to the  current state of emergency, patients may not be receiving their 3-hours of Medicare-mandated therapy.   Team Interventions: Nursing Interventions Skin Care/Wound Management, Patient/Family Education, Bladder Management, Bowel Management, Cognitive Remediation/Compensation  PT interventions Ambulation/gait training, Training and development officer, Cognitive remediation/compensation, Community reintegration, Discharge planning, Disease management/prevention, DME/adaptive equipment instruction, Patient/family education, Neuromuscular re-education, Functional mobility training, Psychosocial support, Skin care/wound management, Splinting/orthotics, Stair training, UE/LE Coordination activities, UE/LE Strength taining/ROM, Therapeutic Exercise, Visual/perceptual remediation/compensation, Therapeutic Activities, Pain management, Functional electrical stimulation  OT Interventions Balance/vestibular training, Discharge planning, Pain management, Self Care/advanced ADL retraining, Therapeutic Activities, UE/LE Coordination activities, Visual/perceptual remediation/compensation, Therapeutic Exercise, Skin care/wound managment, Patient/family education, Functional mobility training, Disease mangement/prevention, Cognitive remediation/compensation, Community reintegration, Engineer, drilling, Neuromuscular re-education, Psychosocial support, Splinting/orthotics, UE/LE Strength taining/ROM, Wheelchair propulsion/positioning  SLP Interventions Functional tasks, Environmental controls, Patient/family education, Therapeutic Activities  TR Interventions    SW/CM Interventions Discharge Planning, Psychosocial Support, Patient/Family Education   Barriers to Discharge MD  Medical stability  Nursing Incontinence, Medication compliance, Wound Care    PT Decreased caregiver support, Neurogenic Bowel & Bladder, Behavior    OT      SLP      SW Other (comments) Need to find the appropriate level of care for  pt at DC from rehab   Team Discharge Planning: Destination: PT-Assisted Living ,OT- Assisted Living , SLP-  Projected Follow-up: PT-Home health PT, OT-  Home health OT, SLP-Other (comment)(family plans to find ALF with continuim of care) Projected Equipment Needs: PT-Rolling walker with 5" wheels, Wheelchair (measurements),  Wheelchair cushion (measurements), OT- To be determined, SLP-None recommended by SLP Equipment Details: PT- , OT-  Patient/family involved in discharge planning: PT- Patient,  OT-Patient unable/family or caregiver not available, SLP-Patient unable/family or caregive not available, Family member/caregiver  MD ELOS: 18-21d Medical Rehab Prognosis:  Excellent Assessment:  85 year old right-handed female with history of hypertension,ulcerative colitis, neurogenic bladder 2007 after a fall followed [facility]rology [facility]. Per chart review she lives alone in [place]. She has a home health aide 2 days a week. Local family check on her as needed. She performs self caths independently with noted history of urinary ostomy. Presented 02/21/2019 with altered mental status. Son had reported she recently stumbled and struck her left forehead into the wall. Cranial CT scan showed acute right parietal hemorrhage 41 x 33 x 46 mm with mild surrounding edema but no right to left shift or intraventricular extension. Slight subarachnoid blood. CT cervical spine negative. CT angiogram of the head with no large vessel occlusion. Follow-up MRI again shows right parietal hemorrhage not increased in size from prior tracings. No visible midline shift   Now requiring 24/7 Rehab RN,MD, as well as CIR level PT, OT and SLP.  Treatment team will focus on ADLs and mobility with goals set at Sup See Team Conference Notes for weekly updates to the plan of care

## 2019-02-25 NOTE — Patient Care Conference (Signed)
Inpatient RehabilitationTeam Conference and Plan of Care Update Date: 02/25/2019   Time: 10:55 AM    Patient Name: Renee JewelsDorothy Fritz      Medical Record Number: [id number]  Date of Birth: June 29, 1926 Sex: Female         Room/Bed: 4W15C/4W15C-01 Payor Info: Payor: MEDICARE / Plan: MEDICARE PART A AND B / Product Type: *No Product type* /    Admitting Diagnosis: 3. CVA 1 Team  Closed TBI  Admit Date/Time:  02/24/2019  6:12 PM Admission Comments: No comment available   Primary Diagnosis:  <principal problem not specified> Principal Problem: <principal problem not specified>  Patient Active Problem List   Diagnosis Date Noted  . Essential hypertension 02/24/2019  . Hyperlipidemia 02/24/2019  . Dysphasia due to recent stroke 02/24/2019  . Ulcerative colitis (HCC) 02/24/2019  . MRSA carrier 02/24/2019  . 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 Homonymous hemianopsia due to recent stroke 02/24/2019  . Left hemiparesis (HCC) 02/24/2019  . Left-sided neglect 02/24/2019  . IVH (intraventricular hemorrhage) (HCC) 02/24/2019  . ICH (intracerebral hemorrhage) (HCC) 02/21/2019  . AKI (acute kidney injury) (HCC)   . S/P hernia repair     Expected Discharge Date:    Team Members Present: Physician leading conference: Dr. Claudette LawsAndrew Kirsteins Social Worker Present: Dossie DerBecky Javion Holmer, LCSW Nurse Present: Kennyth ArnoldStacey Jennings, RN PT Present: Grier RocherAustin Tucker, PT;Rosita Dechalus, PTA OT Present: Jackquline DenmarkKatie Bradsher, OT SLP Present: Reuel DerbyHappi Overton, SLP PPS Coordinator present : Fae PippinMelissa Bowie, SLP     Current Status/Progress Goal Weekly Team Focus  Medical   Admitted for gait disorder and cognitive deficits following intracranial bleed, somnolent, disordered sleep cycle  Maintain medical stability, repeat reduce recurrent stroke, reduce fall risk  Initiate rehabilitation program   Bowel/Bladder   Incontinent of bowel and bladder. LBM 6/23  fewer episodes of incontinence, timed toileting while awake  assess toileting needs qshift  and PRN   Swallow/Nutrition/ Hydration             ADL's     new eval        Mobility     new eval        Communication             Safety/Cognition/ Behavioral Observations  eval pending  eval pending  eval pending   Pain   No complaints of pain  pain less than 3  assess pain qshift and PRN   Skin   Stage 1 to back. Foam dressing in place  No further skin breakdown  Assess skin qshift and PRN      *See Care Plan and progress notes for long and short-term goals.     Barriers to Discharge  Current Status/Progress Possible Resolutions Date Resolved   Physician    Medical stability;Other (comments)  Chronic urinary catheterization which patient did perform  Initiating program  See above      Nursing  Incontinence;Medication compliance;Wound Care               PT  Decreased caregiver support;Neurogenic Bowel & Bladder;Behavior                 OT                  SLP                SW                Discharge Planning/Teaching Needs:    Plan to go to Facility with all levels  of care, was looking prior to [place]. Will address with daughter     Team Discussion:  New eval and setting goals for first day of therapy. Staying awake at night need to switch days and nights. Daughter has been in and will assist   Revisions to Treatment Plan:  New eval    Continued Need for Acute Rehabilitation Level of Care: The patient requires daily medical management by a physician with specialized training in physical medicine and rehabilitation for the following conditions: Daily direction of a multidisciplinary physical rehabilitation program to ensure safe treatment while eliciting the highest outcome that is of practical value to the patient.: Yes Daily medical management of patient stability for increased activity during participation in an intensive rehabilitation regime.: Yes Daily analysis of laboratory values and/or radiology reports with any subsequent need for medication  adjustment of medical intervention for : Neurological problems   I attest that I was present, lead the team conference, and concur with the assessment and plan of the team. Teleconference held due to COVID 19   Alyn Jurney, [place] Rhyme 02/25/2019, 1:39 PM

## 2019-02-25 NOTE — Evaluation (Signed)
Physical Therapy Assessment and Plan  Patient Details  Name: Renee Fritz MRN: [id number] Date of Birth: [birth year]  PT Diagnosis: Abnormal posture, Abnormality of gait, Ataxia, Hemiplegia non-dominant and Muscle weakness Rehab Potential: Good ELOS: 14-16 days   Today's Date: 02/25/2019 PT Individual Time:800-900    60 min   Problem List:  Patient Active Problem List   Diagnosis Date Noted  . Essential hypertension 02/24/2019  . Hyperlipidemia 02/24/2019  . Dysphasia due to recent stroke 02/24/2019  . Ulcerative colitis ([facility]) 02/24/2019  . MRSA carrier 02/24/2019  . 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 Homonymous hemianopsia due to recent stroke 02/24/2019  . Left hemiparesis ([place]) 02/24/2019  . Left-sided neglect 02/24/2019  . IVH (intraventricular hemorrhage) ([place]) 02/24/2019  . ICH (intracerebral hemorrhage) ([place]) 02/21/2019  . AKI (acute kidney injury) ([place])   . S/P hernia repair     Past Medical History:  Past Medical History:  Diagnosis Date  . Hypertension   . Incarcerated femoral hernia 07/03/2017  . Osteoporosis   . Other specified cardiac arrhythmias   . Pneumonia   . Ulcerative (chronic) enterocolitis ([place])    Past Surgical History:  Past Surgical History:  Procedure Laterality Date  . FEMORAL HERNIA REPAIR Left 07/03/2017   Procedure: HERNIA REPAIR FEMORAL;  Surgeon: Coralie Keens, MD;  Location: [facility];  Service: General;  Laterality: Left;  . urinary ostomy      Assessment & Plan Clinical Impression: Patient is a 85 year old right-handed female with history of hypertension,ulcerative colitis, neurogenic bladder 2007 after a fall followed by urology services [facility]. Per chart review she lives alone in [place]. She has a home health aide 2 days a week. Local family check on her as needed. She performs self caths independently with noted history of urinary ostomy. Presented 02/21/2019 with altered mental status.  Son had reported she recently stumbled and struck her left forehead into the wall. Cranial CT scan showed acute right parietal hemorrhage 41 x 33 x 46 mm with mild surrounding edema but no right to left shift or intraventricular extension. Slight subarachnoid blood. CT cervical spine negative. CT angiogram of the head with no large vessel occlusion. Follow-up MRI again shows right parietal hemorrhage not increased in size from prior tracings. No visible midline shift.   Patient transferred to CIR on 02/24/2019 .   Patient currently requires mod with mobility secondary to muscle weakness, decreased cardiorespiratoy endurance, decreased coordination and decreased motor planning, decreased visual perceptual skills and field cut, decreased attention to left, decreased motor planning and ideational apraxia and decreased sitting balance, decreased standing balance, decreased postural control, hemiplegia and decreased balance strategies.  Prior to hospitalization, patient was independent  with mobility and lived with Alone in a House home.  Home access is steps to enter.     Patient will benefit from skilled PT intervention to maximize safe functional mobility, minimize fall risk and decrease caregiver burden for planned discharge home with 24 hour supervision.  Anticipate patient will benefit from follow up [place] at discharge.  PT - End of Session Activity Tolerance: Tolerates < 10 min activity, no significant change in vital signs Endurance Deficit: Yes PT Assessment Rehab Potential (ACUTE/IP ONLY): Good PT Barriers to Discharge: Decreased caregiver support;Neurogenic Bowel & Bladder;Behavior PT Patient demonstrates impairments in the following area(s): Balance;Behavior;Safety;Edema;Endurance;Motor;Nutrition;Pain;Perception PT Transfers Functional Problem(s): Bed Mobility;Bed to Chair;Car;Furniture;Floor PT Locomotion Functional Problem(s): Ambulation;Wheelchair Mobility;Stairs PT Plan PT Intensity:  Minimum of 1-2 x/day ,45 to 90 minutes PT Frequency:  5 out of 7 days PT Duration Estimated Length of Stay: 14-16 days PT Treatment/Interventions: Ambulation/gait training;Balance/vestibular training;Cognitive remediation/compensation;Community reintegration;Discharge planning;Disease management/prevention;DME/adaptive equipment instruction;Patient/family education;Neuromuscular re-education;Functional mobility training;Psychosocial support;Skin care/wound management;Splinting/orthotics;Stair training;UE/LE Coordination activities;UE/LE Strength taining/ROM;Therapeutic Exercise;Visual/perceptual remediation/compensation;Therapeutic Activities;Pain management;Functional electrical stimulation PT Transfers Anticipated Outcome(s): Supervision assist with LRAD PT Locomotion Anticipated Outcome(s): Min-supervision assist with LRAD at ambulatory level PT Recommendation Recommendations for Other Services: Therapeutic Recreation consult Therapeutic Recreation Interventions: Stress management Follow Up Recommendations: Home health PT Patient destination: Assisted Living Equipment Recommended: Rolling walker with 5" wheels;Wheelchair (measurements);Wheelchair cushion (measurements)  Skilled Therapeutic Intervention Pt received sitting in WC and agreeable to PT. PT instructed patient in PT Evaluation and initiated treatment intervention; see below for results. PT educated patient in [place], rehab potential, rehab goals, and discharge recommendations. Gait training without AD as listed below. Stand pivot transfers to and from mat table with mod assist without AD and min assist with RW. Pt falling asleep throughout treatment, and unable to perform WC mobility due to extreme fatigue. Patient returned to room and left sitting in [facility] with call bell in reach and all needs met.      PT Evaluation Precautions/Restrictions   fall General   Vital Signs Home Living/Prior Functioning Home Living Available Help at  Discharge: Other (Comment)(family plans to find ALF) Type of Home: House Home Access: Stairs to enter Entrance Stairs-Rails: Right;Left Home Layout: Two level;Bed/bath upstairs Alternate Level Stairs-Number of Steps: 1 flight Alternate Level Stairs-Rails: Right Bathroom Shower/Tub: Chiropodist: Standard Bathroom Accessibility: Yes Additional Comments: Pt performs self cath independently  Lives With: Alone Prior Function Level of Independence: Independent with basic ADLs;Independent with transfers;Independent with homemaking with ambulation Comments: Drives, active, exercises Vision/Perception  Vision - Assessment Eye Alignment: Within Functional Limits Alignment/Gaze Preference: Gaze right Tracking/Visual Pursuits: Requires cues, head turns, or add eye shifts to track Perception Perception: Impaired Inattention/Neglect: Does not attend to left side of body;Does not attend to left visual field Praxis Praxis: Impaired Praxis Impairment Details: Perseveration;Motor planning;Ideomotor;Initiation;Ideation  Cognition Overall Cognitive Status: Impaired/Different from baseline Arousal/Alertness: Awake/alert Orientation Level: Oriented to person;Disoriented to time;Disoriented to situation;Disoriented to place Attention: Sustained Sustained Attention: Impaired Sustained Attention Impairment: Verbal basic;Functional basic Memory: Impaired Memory Impairment: Storage deficit;Decreased recall of new information Awareness: Impaired Awareness Impairment: Intellectual impairment Problem Solving: Impaired Problem Solving Impairment: Verbal basic;Functional basic Executive Function: (all impaired by lower level deficits) Behaviors: Restless;Impulsive;Perseveration;Poor frustration tolerance Safety/Judgment: Impaired Comments: no inishgt into current deficits, attempts to get out of bed without assistance, left neglect [facility] Scales of Cognitive Functioning:  Confused/inappropriate/non-agitated Sensation Sensation Light Touch: Impaired by gross assessment(poor attention the L with hearing deficits, limit findings on the L) Proprioception: Impaired by gross assessment Coordination Gross Motor Movements are Fluid and Coordinated: No Fine Motor Movements are Fluid and Coordinated: No Coordination and Movement Description: inconsistent on the L with novel tasks. Motor  Motor Motor: Hemiplegia Motor - Skilled Clinical Observations: Mild L sided weakness with inattention  Mobility Bed Mobility Bed Mobility: Rolling Right;Rolling Left;Supine to Sit;Sit to Supine Rolling Right: Minimal Assistance - Patient > 75% Rolling Left: Minimal Assistance - Patient > 75% Supine to Sit: Moderate Assistance - Patient 50-74% Sit to Supine: Contact Guard/Touching assist Transfers Transfers: Sit to Stand;Stand Pivot Transfers Sit to Stand: Moderate Assistance - Patient 50-74% Stand Pivot Transfers: Moderate Assistance - Patient 50 - 74% Transfer (Assistive device): None Locomotion  Gait Gait Distance (Feet): 10 Feet Gait Gait: Yes Gait Pattern: Impaired Gait Pattern: Decreased step length -  left;Shuffle;Narrow base of support;Left flexed knee in stance Stairs / Additional Locomotion Stairs: No Wheelchair Mobility Wheelchair Mobility: Yes Wheelchair Assistance: Moderate Assistance - Patient 50 - 74% Wheelchair Propulsion: Both upper extremities Wheelchair Parts Management: Needs assistance  Trunk/Postural Assessment  Cervical Assessment Cervical Assessment: Exceptions to [facility] Thoracic Assessment Thoracic Assessment: Exceptions to [facility] Lumbar Assessment Lumbar Assessment: Exceptions to [facility] Postural Control Postural Control: Deficits on evaluation Righting Reactions: delayed Protective Responses: limited in standing  Balance Balance Balance Assessed: Yes Static Sitting Balance Static Sitting - Level of Assistance: 5: Stand by assistance Dynamic  Sitting Balance Dynamic Sitting - Level of Assistance: 5: Stand by assistance Static Standing Balance Static Standing - Level of Assistance: 3: Mod assist Dynamic Standing Balance Dynamic Standing - Level of Assistance: 3: Mod assist(UE supported by PT) Extremity Assessment      RLE Assessment RLE Assessment: Within Functional Limits General Strength Comments: grossly 4+/5 LLE Assessment LLE Assessment: Exceptions to [facility] General Strength Comments: grossly 4-/5 with limited testing due to decreased attention to taks and L side.    Refer to Care Plan for Long Term Goals  Recommendations for other services: Therapeutic Recreation  Stress management  Discharge Criteria: Patient will be discharged from PT if patient refuses treatment 3 consecutive times without medical reason, if treatment goals not met, if there is a change in medical status, if patient makes no progress towards goals or if patient is discharged from hospital.  The above assessment, treatment plan, treatment alternatives and goals were discussed and mutually agreed upon: by patient  [name] 02/25/2019, 9:57 AM

## 2019-02-25 NOTE — Progress Notes (Signed)
Social Work  Social Work Assessment and Plan  Patient Details  Name: Renee Fritz MRN: [id number] Date of Birth: [birth year]  Today's Date: 02/25/2019  Problem List:  Patient Active Problem List   Diagnosis Date Noted  . Essential hypertension 02/24/2019  . Hyperlipidemia 02/24/2019  . Dysphasia due to recent stroke 02/24/2019  . Ulcerative colitis (HCC) 02/24/2019  . MRSA carrier 02/24/2019  . 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 Homonymous hemianopsia due to recent stroke 02/24/2019  . Left hemiparesis (HCC) 02/24/2019  . Left-sided neglect 02/24/2019  . IVH (intraventricular hemorrhage) (HCC) 02/24/2019  . ICH (intracerebral hemorrhage) (HCC) 02/21/2019  . AKI (acute kidney injury) (HCC)   . S/P hernia repair    Past Medical History:  Past Medical History:  Diagnosis Date  . Hypertension   . Incarcerated femoral hernia 07/03/2017  . Osteoporosis   . Other specified cardiac arrhythmias   . Pneumonia   . Ulcerative (chronic) enterocolitis (HCC)    Past Surgical History:  Past Surgical History:  Procedure Laterality Date  . FEMORAL HERNIA REPAIR Left 07/03/2017   Procedure: HERNIA REPAIR FEMORAL;  Surgeon: Abigail MiyamotoBlackman, Douglas, MD;  [facility];  Service: General;  Laterality: Left;  . urinary ostomy     Social History:  reports that she has never smoked. She has never used smokeless tobacco. She reports that she does not drink alcohol or use drugs.  Family / Support Systems Marital Status: Widow/Widower Patient Roles: Parent Children: Renee Fritz-daughter-Renee Fritz [phone]-cell-POA, Renee Fritz-son [phone]-cell [place] Other Supports: Daughter-Renee Fritz, Son-Renee Fritz and son-Renee Fritz. She had a CNA 2x week for appointments and some home management Anticipated Caregiver: ALF with several tiers to meet pt's needs Ability/Limitations of Caregiver: Dependent upon pt's care needs Caregiver Availability: Other (Comment)(Await pt's level of care and her needs) Family Dynamics: Close knit family who are very  involved with pt. Was in the process of looking at senior apartments in [place] prior to this happening. Now looking at ALF and the care they can provide her. Son and daughter in-law [facility]  Social History Preferred language: English Religion:  Cultural Background: No issues Education: HS Read: Yes Write: Yes Employment Status: Retired Marine scientistLegal History/Current Legal Issues: No issues-currently confused and not capable of making her own decisions while here. Will look toward her daughter-Renee Fritz who is her POA, while she will keep others involved in Mom's care and updated Guardian/Conservator: Renee Fritz-POA   Abuse/Neglect Abuse/Neglect Assessment Can Be Completed: Yes Physical Abuse: Denies Verbal Abuse: Denies Sexual Abuse: Denies Exploitation of patient/patient's resources: Denies Self-Neglect: Denies  Emotional Status Pt's affect, behavior and adjustment status: Pt is confused and not able to answer worker's questions. She does recognize her daughter when here. Obtained information from daughter-Renee Fritz, who reports her Mom was living alone and able to take care of herself. She did have an aide to help with appointments and some cleaning. Pt was able to take care of her needs and drove very short distances. Recent Psychosocial Issues: was planning on moving to a senior apartment in [place] closer to her daughter Psychiatric History: No history deferred depression screen due to confusion. Will continue to re-assess and see when appropriate if would benefit from neuro-psych services Substance Abuse History: No issues  Patient / Family Perceptions, Expectations & Goals Pt/Family understanding of illness & functional limitations: Daughter can explain her Mom's condition she has spoken with the MD and consulting MD"s regarding treatment plan. All are hopeful she will do well here on rehab and will regain  her function. Aware will take time to heal and see what  deficits she will have. Premorbid pt/family roles/activities: Mom, grandmother, home owner, church member, etc Anticipated changes in roles/activities/participation: resume Pt/family expectations/goals: Pt states: " I'm tired."  Daughter states: " We will work on the most appropriate place for her, but hope she can go to the assisted living in [facility]  [employer]: Other (Comment)(was at [facility] rehab in 2007) Premorbid Home Care/DME Agencies: None Transportation available at discharge: Self and aide-2 times week. Daughter and son would take places also Resource referrals recommended: Support group (specify)  Discharge Planning Living Arrangements: Alone Support Systems: Children, Water engineer, Church/faith community Type of Residence: Private residence [place] Name: un-decided Insurance Resources: [employer], Multimedia programmer (specify)(tricare) Financial Resources: Radio broadcast assistant Screen Referred: No Living Expenses: Own Money Management: Patient Does the patient have any problems obtaining your medications?: No Home Management: Self and hired aide 2x week Patient/Family Preliminary Plans: Plan on pt going to a ALF in [place] close to her daughter, if able to provide the level of care pt will need at discharge from rehab. Both daughter's are visiting and looking at facilities in [place] and finding out the paperwork needed. Will await therapy evaluations and work on the best plan for pt. Sw Barriers to Discharge: Other (comments) Sw Barriers to Discharge Comments: Need to find the appropriate level of care for pt at DC from rehab Social Work Anticipated Follow Up Needs: ALF/IL, HH/OP, Support Group  Clinical Impression Pleasantly confused female who is here on rehab due to [place]. She was very independent prior to her fall and was self cathing herself due to bladder issues from 2007. She was planning on moving closer to daughter in [place] prior to  this hospitalization. Both daughter's are currently looking at facilities in [facility] for pt to go to after CIR. Will await therapy evaluations and work on best place for pt to meet her needs.  [name] 02/25/2019, 2:54 PM

## 2019-02-25 NOTE — Progress Notes (Signed)
Initial Nutrition Assessment  RD working remotely.  DOCUMENTATION CODES:   Underweight, unable to assess for malnutrition at this time  INTERVENTION:   - Yogurt TID with meals  - Ensure Enlive po BID, each supplement provides 350 kcal and 20 grams of protein (vanilla flavor)  - Magic cup BID with lunch and dinner meals, each supplement provides 290 kcal and 9 grams of protein  - Encourage adequate PO intake and provide feeding assistance as needed  NUTRITION DIAGNOSIS:   Increased nutrient needs related to other (therapies) as evidenced by estimated needs.  GOAL:   Patient will meet greater than or equal to 90% of their needs  MONITOR:   PO intake, Supplement acceptance, Weight trends, I & O's, Labs  REASON FOR ASSESSMENT:   Malnutrition Screening Tool    ASSESSMENT:   85 year old female with PMH of HTN, ulcerative colitis, neurogenic bladder 2007 after a fall. Pt performs self caths independently with noted history of urinary ostomy. Pt presented 02/21/19 with AMS. Son had reported she recently stumbled and struck her left forehead on the wall. Cranial CT scan showed acute right parietal hemorrhage with mild surrounding edema. Follow-up MRI again shows right parietal hemorrhage not increased in size from prior tracings. Pt admitted to CIR on 6/23.  Noted pt refused all 0800 oral medications.  Reviewed RN edema assessment. Pt with mild pitting edema to BLE.  Spoke with pt's daughter via phone call to pt's room. Pt eating lunch with assistance at time of RD phone call. Pt's daughter brought pt some [employer] for lunch. Per pt's daughter, pt does not like hospital food and is very picky.  Pt's daughter reports that pt typically eats 3 meals daily.  Breakfast: scrambled eggs and toast/English muffin Lunch: ham sandwich Dinner: something that family prepared and froze for her (casserole, lasagna)  Pt's daughter shares that pt has been cooking less recently due to  decreased energy levels. Pt's daughter notes that pt is confused and having trouble coordinating her movements while eating.  Pt's daughter reports that pt consumed some mashed potatoes at dinner yesterday but would not touch the meat. Pt's daughter states pt likes yogurt. RD will order yogurt TID with meals. Will also order Magic Cup BID.  Pt's daughter states that pt occasionally drank Ensure PTA. Pt likes the vanilla flavor. RD will order this between meals.  Pt's daughter shares that pt has lost some weight recently but that she has always been small. Per pt's daughter, pt has been 85 lbs at 5' throughout most of her adulthood.  Weight history in chart is limited. Last weight available PTA is form 2018.  Meal Completion: 0% x 1 recorded meal  Medications reviewed and include: Protonix, KCl 10 mEq BID, Senna  Labs reviewed: sodium 134, HDL 36  NUTRITION - FOCUSED PHYSICAL EXAM:  Unable to complete at this time. RD working remotely.  Diet Order:   Diet Order            Diet regular Room service appropriate? Yes; Fluid consistency: Thin  Diet effective now              EDUCATION NEEDS:   No education needs have been identified at this time  Skin:  Skin Assessment: Skin Integrity Issues: Stage I: mid back  Last BM:  02/24/19 type 6  Height:   Ht Readings from Last 1 Encounters:  02/21/19 5' (1.524 m)    Weight:   Wt Readings from Last 1 Encounters:  02/24/19 41.3 kg  Ideal Body Weight:  45.5 kg  BMI:  Body mass index is 17.77 kg/m.  Estimated Nutritional Needs:   Kcal:  1100-1300  Protein:  50-60 grams  Fluid:  1.1-1.3 L    Gaynell Face, MS, RD, LDN Inpatient Clinical Dietitian Pager: [phone] Weekend/After Hours: [phone]

## 2019-02-25 NOTE — Care Management Note (Signed)
Inpatient [facility] Individual Statement of Services  Patient Name:  Renee Fritz  Date:  02/25/2019  Welcome to the [facility].  Our goal is to provide you with an individualized program based on your diagnosis and situation, designed to meet your specific needs.  With this comprehensive rehabilitation program, you will be expected to participate in at least 3 hours of rehabilitation therapies Monday-Friday, with modified therapy programming on the weekends.  Your rehabilitation program will include the following services:  Physical Therapy (PT), Occupational Therapy (OT), Speech Therapy (ST), 24 hour per day rehabilitation nursing, Case Management (Social Worker), Rehabilitation Medicine, Nutrition Services and Pharmacy Services  Weekly team conferences will be held on Wednesday to discuss your progress.  Your Social Worker will talk with you frequently to get your input and to update you on team discussions.  Team conferences with you and your family in attendance may also be held.  Expected length of stay: 14-18 days  Overall anticipated outcome: supervision with cueing  Depending on your progress and recovery, your program may change. Your Social Worker will coordinate services and will keep you informed of any changes. Your Social Worker's name and contact numbers are listed  below.  The following services may also be recommended but are not provided by the [facility] will be made to provide these services after discharge if needed.  Arrangements include referral to agencies that provide these services.  Your insurance has been verified to be:  Medicare & Tricare Your primary doctor is:  Blenda Mounts  Pertinent information will be shared with your doctor and your insurance company.  Social Worker:  Ovidio Kin, [place] or ([phone]  Information discussed with and copy given to patient by: [name], 02/25/2019, 9:13 AM

## 2019-02-25 NOTE — Evaluation (Signed)
Speech Language Pathology Assessment and Plan  Patient Details  Name: Renee Fritz MRN: [id number] Date of Birth: 28-Aug-1926  SLP Diagnosis: Cognitive Impairments  Rehab Potential: Good ELOS: 2 to 2.5 weeks    Today's Date: 02/25/2019 SLP Individual Time: 1300-1400 SLP Individual Time Calculation (min): 60 min   Problem List:  Patient Active Problem List   Diagnosis Date Noted  . Essential hypertension 02/24/2019  . Hyperlipidemia 02/24/2019  . Dysphasia due to recent stroke 02/24/2019  . Ulcerative colitis ([place]) 02/24/2019  . MRSA carrier 02/24/2019  . 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 Homonymous hemianopsia due to recent stroke 02/24/2019  . Left hemiparesis ([facility]) 02/24/2019  . Left-sided neglect 02/24/2019  . IVH (intraventricular hemorrhage) ([facility]) 02/24/2019  . ICH (intracerebral hemorrhage) ([place]) 02/21/2019  . AKI (acute kidney injury) ([facility])   . S/P hernia repair    Past Medical History:  Past Medical History:  Diagnosis Date  . Hypertension   . Incarcerated femoral hernia 07/03/2017  . Osteoporosis   . Other specified cardiac arrhythmias   . Pneumonia   . Ulcerative (chronic) enterocolitis ([place])    Past Surgical History:  Past Surgical History:  Procedure Laterality Date  . FEMORAL HERNIA REPAIR Left 07/03/2017   Procedure: HERNIA REPAIR FEMORAL;  Surgeon: Coralie Keens, MD;  Location: [facility];  Service: General;  Laterality: Left;  . urinary ostomy      Assessment / Plan / Recommendation Clinical Impression Renee Fritz is a 85 year old right-handed female with history of hypertension, ulcerative colitis, neurogenic bladder 2007 after a fall followed by urology services [facility].  Per chart review she lives alone in [place].  She has a home health aide 2 days a week.  Local family check on her as needed.  She performs self caths independently with noted history of urinary ostomy.  Presented 02/21/2019 with altered mental  status.  Son had reported she recently stumbled and struck her left forehead into the wall.  Cranial CT scan showed acute right parietal hemorrhage 41 x 33 x 46 mm with mild surrounding edema but no right to left shift or intraventricular extension.  Slight subarachnoid blood.  Tolerating a regular diet.  Therapy evaluations completed and patient was admitted for a comprehensive rehab program 02/24/19.    Pt presents with significant cognitive impairments associated with right hemisphere disorder. Pt's deficits are c/b decreased sustained attention, inability to problem solve basic ADLs, no awareness of deficits and left visospatial neglect/field cut. Pt also demonstrates mild to moderate hearing loss which is impactful on comprehension of verbal instructions. While pt appears very willing to participate in each task, she doesn't demonstrate retention of information for carryover. Skilled ST is required to target the above mentioned deficits, increase functional independence and reduce caregiver burden. Pt's family hopes to find ALF with continuum of care at discharge from CIR.   Skilled Therapeutic Interventions          Skilled treatment session focused on completion of cognitive linguistic evaluation, see above. Pt with several attempts to get out of bed without assistance to use the bathroom. Total A multimodal cues required to problem solve confusion around transfer to [facility]. Pt's daughter present, education provided on right hemisphere disorder, progress of therapy, POC with all questions answered to daughter's satisfaction. Family plans to find ALF in [place] that has continuum of care.   SLP Assessment  Patient will need skilled Speech Lanaguage Pathology Services during CIR admission    Recommendations  Medication  Administration: Whole meds with puree Supervision: Staff to assist with self feeding;Full supervision/cueing for compensatory strategies(d/t cognition) Follow up Recommendations: Other  (comment)(family plans to find ALF with continuim of care) Equipment Recommended: None recommended by SLP    SLP Frequency 3 to 5 out of 7 days   SLP Duration  SLP Intensity  SLP Treatment/Interventions 2 to 2.5 weeks  Minumum of 1-2 x/day, 30 to 90 minutes  Functional tasks;Environmental controls;Patient/family education;Therapeutic Activities    Pain    Prior Functioning Cognitive/Linguistic Baseline: Within functional limits Type of Home: House  Lives With: Alone Available Help at Discharge: Other (Comment)(family plans to find ALF with continium of care)  Short Term Goals: Week 1: SLP Short Term Goal 1 (Week 1): Pt will sustain attention to basic familiar task for 2 minutes with Max A cues. SLP Short Term Goal 2 (Week 1): Pt will complete basic problem solving tasks related to ADLs with Max A. SLP Short Term Goal 3 (Week 1): Given Max A cues, pt wil recall orientation information in 4 out of 10 opportunities. SLP Short Term Goal 4 (Week 1): Pt will scan to midline to locate objects during familiar tasks in 4 out of 10 opportunities with Max A cues.  Refer to Care Plan for Long Term Goals  Recommendations for other services: None   Discharge Criteria: Patient will be discharged from SLP if patient refuses treatment 3 consecutive times without medical reason, if treatment goals not met, if there is a change in medical status, if patient makes no progress towards goals or if patient is discharged from hospital.  The above assessment, treatment plan, treatment alternatives and goals were discussed and mutually agreed upon: by patient and by family  Spurgeon Gancarz 02/25/2019, 2:19 PM

## 2019-02-25 NOTE — Evaluation (Signed)
Occupational Therapy Assessment and Plan  Patient Details  Name: Renee Fritz MRN: [id number] Date of Birth: 08-06-1926  OT Diagnosis: cognitive deficits, disturbance of vision and hemiplegia affecting non-dominant side Rehab Potential: Rehab Potential (ACUTE ONLY): Fair ELOS: 14-18 days   Today's Date: 02/25/2019 OT Individual Time: 1520-1620 OT Individual Time Calculation (min): 60 min     Problem List:  Patient Active Problem List   Diagnosis Date Noted  . Essential hypertension 02/24/2019  . Hyperlipidemia 02/24/2019  . Dysphasia due to recent stroke 02/24/2019  . Ulcerative colitis ([place]) 02/24/2019  . MRSA carrier 02/24/2019  . 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 Homonymous hemianopsia due to recent stroke 02/24/2019  . Left hemiparesis ([place]) 02/24/2019  . Left-sided neglect 02/24/2019  . IVH (intraventricular hemorrhage) ([place]) 02/24/2019  . ICH (intracerebral hemorrhage) ([place]) 02/21/2019  . AKI (acute kidney injury) ([place])   . S/P hernia repair     Past Medical History:  Past Medical History:  Diagnosis Date  . Hypertension   . Incarcerated femoral hernia 07/03/2017  . Osteoporosis   . Other specified cardiac arrhythmias   . Pneumonia   . Ulcerative (chronic) enterocolitis ([place])    Past Surgical History:  Past Surgical History:  Procedure Laterality Date  . FEMORAL HERNIA REPAIR Left 07/03/2017   Procedure: HERNIA REPAIR FEMORAL;  Surgeon: Coralie Keens, MD;  Location: [facility];  Service: General;  Laterality: Left;  . urinary ostomy      Assessment & Plan Clinical Impression: Renee Fritz is a 85 year old right-handed female with history of hypertension,ulcerative colitis, neurogenic bladder 2007 after a fall followed by urology services [facility]. Per chart review she lives alone in [place]. She has a home health aide 2 days a week. Local family check on her as needed. She performs self caths independently with noted  history of urinary ostomy. Presented 02/21/2019 with altered mental status. Son had reported she recently stumbled and struck her left forehead into the wall. Cranial CT scan showed acute right parietal hemorrhage 41 x 33 x 46 mm with mild surrounding edema but no right to left shift or intraventricular extension. Slight subarachnoid blood. CT cervical spine negative. CT angiogram of the head with no large vessel occlusion. Follow-up MRI again shows right parietal hemorrhage not increased in size from prior tracings. No visible midline shift. Neurology follow-up with conservative care. Tolerating a regular diet. Therapy evaluations completed and patient was admitted for a comprehensive rehab program. Patient transferred to CIR on 02/24/2019 .    Patient currently requires mod with basic self-care skills secondary to muscle weakness, decreased cardiorespiratoy endurance, ataxia, decreased coordination and decreased motor planning, hemianopsia, decreased midline orientation, decreased attention to left, left side neglect, decreased motor planning and ideational apraxia, decreased initiation, decreased attention, decreased awareness, decreased problem solving, decreased safety awareness, decreased memory and delayed processing and decreased sitting balance, decreased standing balance, decreased postural control, hemiplegia and decreased balance strategies.  Prior to hospitalization, patient could complete ADLs with modified independent .  Patient will benefit from skilled intervention to decrease level of assist with basic self-care skills prior to discharge home with care partner.  Anticipate patient will require 24 hour supervision and follow up home health.  OT - End of Session Activity Tolerance: Tolerates 10 - 20 min activity with multiple rests Endurance Deficit: Yes Endurance Deficit Description: generalized weakness OT Assessment Rehab Potential (ACUTE ONLY): Fair OT Patient demonstrates  impairments in the following area(s): Balance;Pain;Behavior;Perception;Cognition;Safety;Sensory;Endurance;Motor;Nutrition;Vision OT Basic ADL's Functional Problem(s):  Eating;Grooming;Dressing;Bathing;Toileting OT Transfers Functional Problem(s): Toilet;Tub/Shower OT Additional Impairment(s): Fuctional Use of Upper Extremity OT Plan OT Intensity: Minimum of 1-2 x/day, 45 to 90 minutes OT Frequency: 5 out of 7 days OT Duration/Estimated Length of Stay: 14-18 days OT Treatment/Interventions: Balance/vestibular training;Discharge planning;Pain management;Self Care/advanced ADL retraining;Therapeutic Activities;UE/LE Coordination activities;Visual/perceptual remediation/compensation;Therapeutic Exercise;Skin care/wound managment;Patient/family education;Functional mobility training;Disease mangement/prevention;Cognitive remediation/compensation;Community reintegration;DME/adaptive equipment instruction;Neuromuscular re-education;Psychosocial support;Splinting/orthotics;UE/LE Strength taining/ROM;Wheelchair propulsion/positioning OT Self Feeding Anticipated Outcome(s): (S) OT Basic Self-Care Anticipated Outcome(s): (S) OT Toileting Anticipated Outcome(s): (S) OT Bathroom Transfers Anticipated Outcome(s): (S) OT Recommendation Recommendations for Other Services: Speech consult Patient destination: Assisted Living Follow Up Recommendations: Home health OT Equipment Recommended: To be determined   Skilled Therapeutic Intervention Pt received at nurses desk in w/c. Pt very confused and oriented only to herself. Pt required max-total cueing throughout session for cognition. Attempted to engage pt in ADLs but pt perseverating on going to grocery store and "being picked up". Pt c/o pain in her legs and requested tylenol- RN entered room to provide. Pt required heavy multimodal cueing and modeling to take medication- frequently not swallowing pill and attempting to take out of mouth. Pt completed BUE IADL  task of folding towel with good LUE spontaneous use and movement observed. Pt completed toilet transfer with mod A, heavy cueing required for RW management/positioning. Pt was left in w/c with all needs met at nursing desk to promote maximal safety.   OT Evaluation Precautions/Restrictions  Precautions Precautions: Fall Precaution Comments: L neglect, L visual deficit (neglect vs. field cut) Restrictions Weight Bearing Restrictions: No General Chart Reviewed: Yes Family/Caregiver Present: No Vital Signs Therapy Vitals Temp: 98.8 F (37.1 C) Pulse Rate: (!) 103 Resp: 18 BP: 133/74 Patient Position (if appropriate): Lying Oxygen Therapy SpO2: 95 % O2 Device: Room Air Pain Pain Assessment Pain Scale: Faces Faces Pain Scale: Hurts little more Pain Type: Acute pain Pain Location: Leg Pain Orientation: Right;Left Pain Descriptors / Indicators: Aching Pain Onset: On-going Pain Intervention(s): RN made aware;Medication (See eMAR) Home Living/Prior [place] expects to be discharged to:: Assisted living Living Arrangements: Alone Available Help at Discharge: Other (Comment)(family plans to find ALF) Type of Home: House Home Access: Stairs to enter Entrance Stairs-Rails: Right, Left Home Layout: Two level, Bed/bath upstairs Alternate Level Stairs-Number of Steps: 1 flight Alternate Level Stairs-Rails: Right Bathroom Shower/Tub: Chiropodist: Standard Bathroom Accessibility: Yes Additional Comments: Pt performs self cath independently  Lives With: Alone IADL History Homemaking Responsibilities: (unclear what level of ALF pt will d/c to) Prior Function Level of Independence: Independent with basic ADLs, Independent with transfers, Independent with homemaking with ambulation Comments: Drives, active, exercises ADL   Vision Baseline Vision/History: No visual deficits Patient Visual Report: Peripheral vision impairment Vision  Assessment?: Yes Eye Alignment: Within Functional Limits Alignment/Gaze Preference: Gaze right Tracking/Visual Pursuits: Requires cues, head turns, or add eye shifts to track Visual Fields: Left visual field deficit Perception  Perception: Impaired Inattention/Neglect: Does not attend to left side of body;Does not attend to left visual field Praxis Praxis: Impaired Praxis Impairment Details: Perseveration;Motor planning;Ideomotor;Initiation;Ideation Cognition Overall Cognitive Status: Impaired/Different from baseline Arousal/Alertness: Awake/alert Orientation Level: Person;Place;Situation Person: Oriented Place: Disoriented Situation: Disoriented Year: (No answer) Day of Week: (no answer) Memory: Impaired Memory Impairment: Storage deficit;Decreased recall of new information Immediate Memory Recall: Sock;Blue;Bed Attention: Sustained Sustained Attention: Impaired Sustained Attention Impairment: Verbal basic;Functional basic Selective Attention Impairment: Verbal basic;Functional basic Awareness: Impaired Awareness Impairment: Intellectual impairment Problem Solving: Impaired Problem Solving Impairment: Verbal basic Executive Function: (all  impaired) Behaviors: Restless;Impulsive;Perseveration;Poor frustration tolerance Safety/Judgment: Impaired Comments: no insight into deficits Rancho Los Amigos Scales of Cognitive Functioning: Confused/inappropriate/non-agitated Sensation Sensation Light Touch: Impaired by gross assessment(unclear finding 2/2 cognition) Proprioception: Impaired by gross assessment Coordination Gross Motor Movements are Fluid and Coordinated: No Fine Motor Movements are Fluid and Coordinated: No Coordination and Movement Description: inconsistent on the L with novel tasks. Motor  Motor Motor: Hemiplegia Motor - Skilled Clinical Observations: Mild L sided weakness with inattention Mobility  Bed Mobility Bed Mobility: Rolling Right;Rolling Left;Supine  to Sit;Sit to Supine Rolling Right: Minimal Assistance - Patient > 75% Rolling Left: Minimal Assistance - Patient > 75% Supine to Sit: Moderate Assistance - Patient 50-74% Sit to Supine: Contact Guard/Touching assist Transfers Sit to Stand: Moderate Assistance - Patient 50-74%  Trunk/Postural Assessment  Cervical Assessment Cervical Assessment: Exceptions to WFL(forward head) Thoracic Assessment Thoracic Assessment: Exceptions to WFL(rounded shoulders) Lumbar Assessment Lumbar Assessment: Exceptions to WFL(posterior pelvic tilt) Postural Control Postural Control: Deficits on evaluation Righting Reactions: delayed Protective Responses: limited in standing  Balance Balance Balance Assessed: Yes Static Sitting Balance Static Sitting - Balance Support: Feet supported Static Sitting - Level of Assistance: 5: Stand by assistance Dynamic Sitting Balance Dynamic Sitting - Balance Support: Feet supported Dynamic Sitting - Level of Assistance: 5: Stand by assistance Static Standing Balance Static Standing - Balance Support: During functional activity;Bilateral upper extremity supported Static Standing - Level of Assistance: 4: Min assist Dynamic Standing Balance Dynamic Standing - Balance Support: During functional activity;Bilateral upper extremity supported Dynamic Standing - Level of Assistance: 3: Mod assist Dynamic Standing - Comments: during toileting tasks Extremity/Trunk Assessment RUE Assessment RUE Assessment: Within Functional Limits(WFL for age) LUE Assessment LUE Assessment: Exceptions to [facility] Strength Comments: Mild L hemi. Pt has full AROM, 4-/5 strength. Decreased coordination, slight ataxia, and inattention LUE Body System: Neuro Brunstrum levels for arm and hand: Arm;Hand Brunstrum level for arm: Stage V Relative Independence from Synergy Brunstrum level for hand: Stage VI Isolated joint movements     Refer to Care Plan for Long Term  Goals  Recommendations for other services: None    Discharge Criteria: Patient will be discharged from OT if patient refuses treatment 3 consecutive times without medical reason, if treatment goals not met, if there is a change in medical status, if patient makes no progress towards goals or if patient is discharged from hospital.  The above assessment, treatment plan, treatment alternatives and goals were discussed and mutually agreed upon: No family available/patient unable  Curtis Sites 02/25/2019, 5:04 PM

## 2019-02-26 ENCOUNTER — Inpatient Hospital Stay (HOSPITAL_COMMUNITY): Payer: Medicare Other | Admitting: Physical Therapy

## 2019-02-26 ENCOUNTER — Inpatient Hospital Stay (HOSPITAL_COMMUNITY): Payer: Medicare Other | Admitting: Speech Pathology

## 2019-02-26 ENCOUNTER — Inpatient Hospital Stay (HOSPITAL_COMMUNITY): Payer: Medicare Other | Admitting: Occupational Therapy

## 2019-02-26 MED ORDER — QUETIAPINE FUMARATE 25 MG PO TABS: 12.5000 mg | ORAL_TABLET | Freq: Every day | ORAL | Status: DC

## 2019-02-26 NOTE — Progress Notes (Signed)
[facility] PHYSICAL MEDICINE & REHABILITATION PROGRESS NOTE   Subjective/Complaints:  Appreciate dietary note, pt somnolent  This am, per RN up much of night   ROS- unable to obtain due to mental status  Objective:   No results found. Recent Labs    02/25/19 0756  WBC 10.5  HGB 14.2  HCT 42.3  PLT 321   Recent Labs    02/25/19 0756  NA 134*  K 3.6  CL 96*  CO2 25  GLUCOSE 104*  BUN 28*  CREATININE 0.95  CALCIUM 8.8*    Intake/Output Summary (Last 24 hours) at 02/26/2019 0754 Last data filed at 02/26/2019 0106 Gross per 24 hour  Intake 120 ml  Output 750 ml  Net -630 ml     Physical Exam: Vital Signs Blood pressure 140/74, pulse (!) 101, temperature 98.6 F (37 C), resp. rate 16, weight 41.2 kg, SpO2 94 %.   General: No acute distress Mood and affect somnolent but awakens to physical stim  Heart: Regular rate and rhythm no rubs murmurs or extra sounds Lungs: Clear to auscultation, breathing unlabored, no rales or wheezes Abdomen: Positive bowel sounds, soft nontender to palpation, nondistended Extremities: No clubbing, cyanosis, or edema Skin: No evidence of breakdown, no evidence of rash Neurologic:will squeeze with BUE but not cooperating with MMT  Sensory exam withdraws to pinch with BUE and BLE Cerebellar exam not cooperating Musculoskeletal:  No joint swelling   Assessment/Plan: 1. 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  Physiatrist is providing close team supervision and 24 hour management of active medical problems listed below.  Physiatrist and rehab team continue to assess barriers to discharge/monitor patient progress toward functional and medical goals  Care Tool:  Bathing  Bathing activity did not occur: Refused           Bathing assist       Upper Body Dressing/Undressing Upper body dressing Upper body  dressing/undressing activity did not occur (including orthotics): Refused      Upper body assist Assist Level: Moderate Assistance - Patient 50 - 74%    Lower Body Dressing/Undressing Lower body dressing    Lower body dressing activity did not occur: Refused       Lower body assist Assist for lower body dressing: Moderate Assistance - Patient 50 - 74%     Toileting Toileting    Toileting assist Assist for toileting: Total Assistance - Patient < 25%     Transfers Chair/bed transfer  Transfers assist     Chair/bed transfer assist level: Moderate Assistance - Patient 50 - 74%     Locomotion Ambulation   Ambulation assist      Assist level: Moderate Assistance - Patient 50 - 74% Assistive device: No Device Max distance: 10   Walk 10 feet activity   Assist     Assist level: Moderate Assistance - Patient - 50 - 74% Assistive device: No Device   Walk 50 feet activity   Assist Walk 50 feet with 2 turns activity did not occur: Safety/medical concerns         Walk 150 feet activity   Assist Walk 150 feet activity did not occur: Safety/medical concerns         Walk 10 feet on uneven surface  activity   Assist Walk 10 feet on uneven surfaces activity did not occur: Safety/medical concerns         Wheelchair  Assist Will patient use wheelchair at discharge?: Yes Type of Wheelchair: Manual Wheelchair activity did not occur: Safety/medical concerns  Wheelchair assist level: Moderate Assistance - Patient 50 - 74% Max wheelchair distance: 5925'    Wheelchair 50 feet with 2 turns activity    Assist    Wheelchair 50 feet with 2 turns activity did not occur: Safety/medical concerns       Wheelchair 150 feet activity     Assist Wheelchair 150 feet activity did not occur: Safety/medical concerns        Medical Problem List and Plan: 1.Decreased functional mobilitysecondary to likelytraumaticright parietalICH(versus  hypertensive etiology) CIR PT, OT SLP 2. Antithrombotics: -DVT/anticoagulation:SCDs -antiplatelet therapy: N/A 3. Pain Management:Tylenol as needed 4. Mood:BuSpar 5 mg twice daily, Pamelor 20 mg nightly -antipsychotic agents: pt with difficulty sleeping at night with increased confusion and restlessness per staff/family. Begin low dose seroquel 12.5mg  qhs -keep sleep chart, sleeping 4a- present, sleep chart not completed , may have hangover effect from nortriptyline and seroquel will reduce dose of nortriptyline  -try to re-establish normal sleep-wake patterns. -per staff , pt was not an "early riser" at home  5. Neuropsych: This patientiscapable of making decisions on herown behalf. 6. Skin/Wound Care:Routine skin checks 7. Fluids/Electrolytes/Nutrition:Routine in and outs with follow-up chemistries 8. Hypertension. HCTZ 25 mg daily, Tenormin 100 mg twice daily, Norvasc 5 mg daily. Monitor with increased mobility Vitals:   02/25/19 2001 02/26/19 0510  BP: (!) 143/75 140/74  Pulse: 100 (!) 101  Resp: 18 16  Temp: 98 F (36.7 C) 98.6 F (37 C)  SpO2: 100% 94%  BP within desired range 9.Neurogenic bladder secondary to a fall 2007. Independent self caths. Followed by Dr. Sabino GasserMullins [facility] -will do I/O caths per RN until she improves neurologically 10. Ulcerative colitis. Mesalamine 800 mg 3 times daily 11. Hyperlipidemia. Vytorin currently on hold secondary to intracerebral hemorrhage  12.  Labs reviewed mild elevation of BUN, creat ok, CBC nl  LOS: 2 days A FACE TO FACE EVALUATION WAS PERFORMED  Erick Colacendrew E Kirsteins 02/26/2019, 7:54 AM

## 2019-02-26 NOTE — Progress Notes (Signed)
Physical Therapy Session Note  Patient Details  Name: Renee Fritz MRN: [id number] Date of Birth: [birth year]  Today's Date: 02/26/2019 PT Individual Time: 1100-1200  AND 1410-1450 PT Individual Time Calculation (min): 60 min and 40 min   Short Term Goals: Week 1:  PT Short Term Goal 1 (Week 1): Pt will performed bed<>WC transfer with min assist and LRAD PT Short Term Goal 2 (Week 1): Pt will attend to L side 50% of the time to improve safety wihty functional mobility. PT Short Term Goal 3 (Week 1): Pt will ambulate 126f with min assist and LRAD PT Short Term Goal 4 (Week 1): Pt will perform bed mobility with min assist consistently  Skilled Therapeutic Interventions/Progress Updates:  Session 1 Pt received supine in bed, asleep and aroused with significant effort. Supine>sit transfer with max assist to improve arousal. Once awake, pt agreeable to participate in PT. Stand pivot transfers to and from [facility] throughout treatment with min assist and RW. PT treatment focused on orientation to situation, and visual scanning through functional tasks improve awareness of the L side. With increased time, pt able to recall then she fell and is in the hospital, but unaware of head trauma.   Sorting colored beetles while sitting EOB. Supervision assist for sitting balance and moderate cues for visual scanning to the L. Able to appropriately locate and sort each color once in field of view. Gait through rehab gym to locate 3 animal figures on the R and 3 on the L. Min assist for gait x 64ffor each bout and moderate cues for attention to the L. Patient returned to room and [facility] in reach and all needs met.      Session 2.  Pt received sitting in WC. Pt transported to day room. Throughout PM treatment, pt noticeably more disoriented than AM treatment. Pt constantly requesting for so to take her home, and that she hope she doesn't need to return to hospital any time soon. Pt  unable to be re-oriented to time, place or situation throughout treatment. Gait training with RW after assuring pt that RW works better than cane and that she will not fall 2 x [id number] reaching task to grab and toss bean bag. Pt required max cues for attention to task and would not scan beyond midline to locate new bags. Pt then reports need for nap once back in WC. Pt returned to room and performed stand pivot transfer to bed with mod assist to prevent fall from incomplete turn to sit EOB. Sit>supine completed with mod assist to initiate movement into supine. Pt continues to demonstrate poor orientation once in bed asking for cat and so to come back. Pt left supine in bed with call bell in reach and all needs met.       Therapy Documentation Precautions:  Precautions Precautions: Fall Precaution Comments: L neglect, L visual deficit (neglect vs. field cut) Restrictions Weight Bearing Restrictions: No   Pain: Pain Assessment Pain Scale: Faces Pain Score: 0-No pain Faces Pain Scale: No hurt    Therapy/Group: Individual Therapy  AusLorie Phenix25/2020, 12:16 PM

## 2019-02-26 NOTE — Progress Notes (Signed)
Speech Language Pathology Daily Session Note  Patient Details  Name: Renee Fritz MRN: [id number] Date of Birth: November 02, 1925  Today's Date: 02/26/2019 SLP Individual Time: 1230-1330 SLP Individual Time Calculation (min): 60 min  Short Term Goals: Week 1: SLP Short Term Goal 1 (Week 1): Pt will sustain attention to basic familiar task for 2 minutes with Max A cues. SLP Short Term Goal 2 (Week 1): Pt will complete basic problem solving tasks related to ADLs with Max A. SLP Short Term Goal 3 (Week 1): Given Max A cues, pt wil recall orientation information in 4 out of 10 opportunities. SLP Short Term Goal 4 (Week 1): Pt will scan to midline to locate objects during familiar tasks in 4 out of 10 opportunities with Max A cues.  Skilled Therapeutic Interventions:  Skilled treatment session focused on cognition goals. SLP received pt upright in wheelchair with lunch tray and nurse attempting to administer medication. Despite multiple attempts and varied attempts, varied cues etc, pt unwilling to consume any lunch or medicine d/t extensive right hemisphere dysfunction. Education provided to pt's nurse on nature of cognitive deficits. If pt's family calls with question regarding confusion/congition, I am happy to speak with them. At the end of the session, pt was calling out for random people including deceased family members (her mother and sister). Pt left upright in wheelchair, lap belt alarm on and all needs within reach. Continue per current plan of care.      Pain Pain Assessment Pain Scale: Faces Pain Score: 0-No pain Faces Pain Scale: No hurt  Therapy/Group: Individual Therapy  [name] 02/26/2019, 1:39 PM

## 2019-02-26 NOTE — Progress Notes (Signed)
Patient refused all night time medication.I attempted to give in pudding and patient stated that "medication would only upset her stomach."

## 2019-02-26 NOTE — Progress Notes (Signed)
Occupational Therapy Session Note  Patient Details  Name: Renee Fritz MRN: [id number] Date of Birth: 17-Apr-1926  Today's Date: 02/26/2019 OT Individual Time: 3716-9678 OT Individual Time Calculation (min): 60 min    Short Term Goals: Week 1:  OT Short Term Goal 1 (Week 1): Pt will don pants with min A OT Short Term Goal 2 (Week 1): Pt will scan to midline to locate 1 item for grooming task with min cueing OT Short Term Goal 3 (Week 1): Pt will attend to bathing L side of body for 1 minute with min cueing  Skilled Therapeutic Interventions/Progress Updates:    Treatment session with focus on ADL retraining and Lt attention.  Pt received supine in bed, easily aroused.  Pt willing to engage in bathing/dressing but once upright at sink pt refused all bathing.  Completed bed mobility with mod assist and mod assist stand pivot.  Engaged in washing face and hands in sitting at sink.  Sit > stand to adjust fit of incontinence brief, however pt sitting prematurely.  Pt refused oral care or further bathing.  Did don pants with max multimodal cues and assistance due to impaired attention and sequencing.  Engaged in visual scanning task with focus on Lt attention.  Pt able to correctly identify colors and numbers, even arranging numbers in numerical order with min cues to scan to Lt.  When increased challenge to sorting matching colors, pt unable to complete.  Pt with decreased arousal requiring max multimodal input to attempt to maintain arousal.  Pt returned to room and completed max assist stand pivot transfer and was asleep before therapist had left room.  Therapy Documentation Precautions:  Precautions Precautions: Fall Precaution Comments: L neglect, L visual deficit (neglect vs. field cut) Restrictions Weight Bearing Restrictions: No Pain: Pain Assessment Pain Scale: Faces Pain Score: 0-No pain Faces Pain Scale: No hurt   Therapy/Group: Individual Therapy  Simonne Come 02/26/2019, 12:39  PM

## 2019-02-27 ENCOUNTER — Inpatient Hospital Stay (HOSPITAL_COMMUNITY): Payer: Medicare Other | Admitting: Physical Therapy

## 2019-02-27 ENCOUNTER — Inpatient Hospital Stay (HOSPITAL_COMMUNITY): Payer: Medicare Other | Admitting: Speech Pathology

## 2019-02-27 ENCOUNTER — Inpatient Hospital Stay (HOSPITAL_COMMUNITY): Payer: Medicare Other | Admitting: Occupational Therapy

## 2019-02-27 ENCOUNTER — Telehealth: Payer: Self-pay | Admitting: Physical Medicine & Rehabilitation

## 2019-02-27 ENCOUNTER — Inpatient Hospital Stay (HOSPITAL_COMMUNITY): Payer: Medicare Other

## 2019-02-27 LAB — URINALYSIS, ROUTINE W REFLEX MICROSCOPIC
Bilirubin Urine: NEGATIVE
Glucose, UA: NEGATIVE mg/dL
Ketones, ur: 20 mg/dL — AB
Nitrite: POSITIVE — AB
Protein, ur: 30 mg/dL — AB
Specific Gravity, Urine: 1.017 (ref 1.005–1.030)
WBC, UA: >50 WBC/hpf — ABNORMAL HIGH (ref 0–5)
pH: 5 (ref 5.0–8.0)

## 2019-02-27 MED ORDER — METOPROLOL SUCCINATE ER 50 MG PO TB24
50.0000 mg | ORAL_TABLET | Freq: Every day | ORAL | Status: DC
  Administered 2019-02-28 – 2019-03-03 (×4): 50 mg via ORAL
  Filled 2019-02-27 (×4): qty 1

## 2019-02-27 MED ORDER — DEXTROSE-NACL 5-0.45 % IV SOLN
INTRAVENOUS | Status: DC
  Administered 2019-02-27 – 2019-03-01 (×4): via INTRAVENOUS

## 2019-02-27 NOTE — Progress Notes (Addendum)
[place] PHYSICAL MEDICINE & REHABILITATION PROGRESS NOTE   Subjective/Complaints:  Refused pm meds "stomach upset" , did not receive pm dose of buspar or tenormin, has not received seroquel yet    ROS- unable to obtain due to mental status  Objective:   No results found. Recent Labs    02/25/19 0756  WBC 10.5  HGB 14.2  HCT 42.3  PLT 321   Recent Labs    02/25/19 0756  NA 134*  K 3.6  CL 96*  CO2 25  GLUCOSE 104*  BUN 28*  CREATININE 0.95  CALCIUM 8.8*    Intake/Output Summary (Last 24 hours) at 02/27/2019 0830 Last data filed at 02/27/2019 0818 Gross per 24 hour  Intake 120 ml  Output 950 ml  Net -830 ml     Physical Exam: Vital Signs Blood pressure (!) 122/104, pulse 87, temperature 98 F (36.7 C), resp. rate 18, weight 41.2 kg, SpO2 97 %.   General: No acute distress Mood and affect somnolent but awakens to physical stim  Heart: Regular rate and rhythm no rubs murmurs or extra sounds Lungs: Clear to auscultation, breathing unlabored, no rales or wheezes Abdomen: Positive bowel sounds, soft nontender to palpation, nondistended Extremities: No clubbing, cyanosis, or edema Skin: No evidence of breakdown, no evidence of rash Neurologic: Poor cooperation with manual muscle testing but grossly is 4/5 bilateral upper and lower limbs.  Sensory exam withdraws to pinch with BUE and BLE Cerebellar exam not cooperating Musculoskeletal:  No joint swelling   Assessment/Plan: 1. 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  Physiatrist is providing close team supervision and 24 hour management of active medical problems listed below.  Physiatrist and rehab team continue to assess barriers to discharge/monitor patient progress toward functional and medical goals  Care Tool:  Bathing  Bathing activity did not occur: Refused           Bathing assist        Upper Body Dressing/Undressing Upper body dressing Upper body dressing/undressing activity did not occur (including orthotics): Refused      Upper body assist Assist Level: Moderate Assistance - Patient 50 - 74%    Lower Body Dressing/Undressing Lower body dressing    Lower body dressing activity did not occur: Refused What is the patient wearing?: Pants     Lower body assist Assist for lower body dressing: Maximal Assistance - Patient 25 - 49%     Toileting Toileting    Toileting assist Assist for toileting: Total Assistance - Patient < 25%     Transfers Chair/bed transfer  Transfers assist     Chair/bed transfer assist level: Maximal Assistance - Patient 25 - 49%     Locomotion Ambulation   Ambulation assist      Assist level: Minimal Assistance - Patient > 75% Assistive device: Walker-rolling Max distance: 60   Walk 10 feet activity   Assist     Assist level: Minimal Assistance - Patient > 75% Assistive device: No Device   Walk 50 feet activity   Assist Walk 50 feet with 2 turns activity did not occur: Safety/medical concerns  Assist level: Minimal Assistance - Patient > 75%      Walk 150 feet activity   Assist Walk 150 feet activity did not occur: Safety/medical concerns         Walk 10 feet on uneven surface  activity   Assist Walk 10  feet on uneven surfaces activity did not occur: Safety/medical concerns         Wheelchair     Assist Will patient use wheelchair at discharge?: Yes Type of Wheelchair: Manual Wheelchair activity did not occur: Safety/medical concerns  Wheelchair assist level: Moderate Assistance - Patient 50 - 74% Max wheelchair distance: 4925'    Wheelchair 50 feet with 2 turns activity    Assist    Wheelchair 50 feet with 2 turns activity did not occur: Safety/medical concerns       Wheelchair 150 feet activity     Assist Wheelchair 150 feet activity did not occur: Safety/medical  concerns        Medical Problem List and Plan: 1.Decreased functional mobilitysecondary to likelytraumaticright parietalICH(versus hypertensive etiology) CIR PT, OT SLP 2. Antithrombotics: -DVT/anticoagulation:SCDs -antiplatelet therapy: N/A 3. Pain Management:Tylenol as needed 4. Mood:BuSpar 5 mg twice daily, Pamelor 10 mg nightly -antipsychotic agents: pt with difficulty sleeping at night with increased confusion and restlessness per staff/family. Ordered  seroquel 12.5mg  qhs but pt has not taken  -keep sleep chart, sleeping 2a- 7a- present, has refused pm meds  -try to re-establish normal sleep-wake patterns. -per staff , pt was not an "early riser" at home  5. Neuropsych: This patientiscapable of making decisions on herown behalf. 6. Skin/Wound Care:Routine skin checks 7. Fluids/Electrolytes/Nutrition:Routine in and outs with follow-up chemistries 8. Hypertension. HCTZ 25 mg daily, Tenormin 100 mg twice daily, Norvasc 5 mg daily. Monitor with increased mobility Vitals:   02/26/19 1923 02/27/19 0416  BP: 139/87 (!) 122/104  Pulse: 77 87  Resp: 17 18  Temp: 97.7 F (36.5 C) 98 F (36.7 C)  SpO2:  97%  diastolic elevation d/c tenormin, change to toprol xl to allow once a day dosing  9.Neurogenic bladder secondary to a fall 2007. Independent self caths. [facility] -will do I/O caths per RN until she improves neurologically 10. Ulcerative colitis. Mesalamine 800 mg 3 times daily 11. Hyperlipidemia. Vytorin currently on hold secondary to intracerebral hemorrhage  12.  Labs reviewed mild elevation of BUN, creat ok, CBC nl, poor intake Add IVF at noc, given poor intake will check for UTI          lOS: 3 days A FACE TO FACE EVALUATION WAS PERFORMED  Erick Colacendrew E Terry Bolotin 02/27/2019, 8:30 AM

## 2019-02-27 NOTE — Progress Notes (Signed)
Social Work Patient ID: Renee Fritz, female   DOB: 03/21/1926, 85 y.o.   MRN: [id number] Met with Carol-daughter when here to discuss options for discharge for pt. Family discussing if able to go to ALF or if qualifies more for SNF. But with COVID and not being able to have visitors concerns all of them. Have discussed taking home with 24 hr caregivers for a short time but not able to afford this for longer than one month. Want pt to be able to see family and feels this will help with her progress and healing. Will continue to work with family on most appropriate discharge plan. Daughter aware of her mom not eating and will bring in food and encourage her when she is here to eat. Daughter reports pt is a very picky eater.

## 2019-02-27 NOTE — Telephone Encounter (Signed)
Unclear how a ptn from IP family member transferred to my voicemail about a RX.  Daughter Andilyn Bettcher states that there is a med change (antipsychotic) that she is not happy with - request a call for understanding her number [phone]--- call was tfer to my voicemail Thursday after noon apparently-

## 2019-02-27 NOTE — Progress Notes (Signed)
Speech Language Pathology Daily Session Note  Patient Details  Name: Mayling Aber MRN: [id number] Date of Birth: 07-Jan-1926  Today's Date: 02/27/2019 SLP Individual Time: 1300-1330 SLP Individual Time Calculation (min): 30 min  Short Term Goals: Week 1: SLP Short Term Goal 1 (Week 1): Pt will sustain attention to basic familiar task for 2 minutes with Max A cues. SLP Short Term Goal 2 (Week 1): Pt will complete basic problem solving tasks related to ADLs with Max A. SLP Short Term Goal 3 (Week 1): Given Max A cues, pt wil recall orientation information in 4 out of 10 opportunities. SLP Short Term Goal 4 (Week 1): Pt will scan to midline to locate objects during familiar tasks in 4 out of 10 opportunities with Max A cues.  Skilled Therapeutic Interventions:  Skilled treatment session focused on education. Pt's daughter Arbie Cookey was in session today with question pertaining to pt's decreased consumption of PO (even pt's favorite items).  SLP provided information regarding cognitive deficits related to brain injury and followed up with pt's PA, who went in to answer daughter's questions. Daughter to bring in pt's favorite food items for additional stimulation.      Pain Pain Assessment Pain Score: Asleep  Therapy/Group: Individual Therapy  Gera Inboden 02/27/2019, 2:30 PM

## 2019-02-27 NOTE — Progress Notes (Addendum)
Physical Therapy Session Note  Patient Details  Name: Renee Fritz MRN: [id number] Date of Birth: [birth year]  Today's Date: 02/27/2019 PT Individual Time: 1005-1100 AND 1425-1505 PT Individual Time Calculation (min): 55 min and 40 min   Short Term Goals: Week 1:  PT Short Term Goal 1 (Week 1): Pt will performed bed<>WC transfer with min assist and LRAD PT Short Term Goal 2 (Week 1): Pt will attend to L side 50% of the time to improve safety wihty functional mobility. PT Short Term Goal 3 (Week 1): Pt will ambulate 169f with min assist and LRAD PT Short Term Goal 4 (Week 1): Pt will perform bed mobility with min assist consistently  Skilled Therapeutic Interventions/Progress Updates:  Session 1 Pt received supine in bed and agreeable to PT. Supine>sit transfer with mod assist and cues for initiation of movement. PT assisted pt to don pants EOB with min assist and moderate cues for use of RW and improved safety. [facility].   Gait training in day room x 637fto attempt to locate 3 items on the R and L. Min assist for AD management and moderate cues for visual scanning to the L. Object identification to name and locate animal figure from 20. Moderate cues for visual scanning to the L to improved success. Gait training with RW x [id number] day room with instruction to locate elevators. Pt able to retain objective, but unable to distinguish elevators from regular doors.  Sorting task while sitting EOM. Pt able to sort 1 set of items from 3 fields, but un successful with 5 fields; improved visual scanning to L noted in sorting activity.  Dyn[employer] program A to improve attention to the L. Only able to sustain attention x 20sec before sitting back down in WC.   Patient returned to room and [facility] and all needs met.    Session 2.  Pt initially off unit or  Cranial CT. PT returned in 10 minutes and pt present in bed. Aroused  easily and agreeable to PT. Pt noticeably disoriented to place, time, and situation this PM. Throughout session pt impulsively standing from chair, couch, and WC stating that she needs to find the doughnuts, and would begin to walk with RW incorrectly until corrected by PT. Gait training through rehab unit 2 x [id number] RW and min assist. Min-mod cues for attention to L and oriention to place to improve safety. Patient returned to room and [facility] and all needs met.          Therapy Documentation Precautions:  Precautions Precautions: Fall Precaution Comments: L neglect, L visual deficit (neglect vs. field cut) Restrictions Weight Bearing Restrictions: No   Pain: Pain Assessment Pain Score: denies    Therapy/Group: Individual Therapy  AustiLorie Phenix/2020, 2:37 PM

## 2019-02-27 NOTE — Progress Notes (Signed)
Spoke at length with daughter in regards to patient's current condition and functional status as well as medical.  Daughter requests discontinuation of Seroquel as she feels this is very sedating and request Klonopin if needed.  At this time we will continue to monitor await results of cranial CT scan with all issues discussed with daughter.

## 2019-02-27 NOTE — Progress Notes (Signed)
Occupational Therapy Session Note  Patient Details  Name: Renee Fritz MRN: [id number] Date of Birth: Jan 28, 1926  Today's Date: 02/27/2019 OT Individual Time: 4196-2229 OT Individual Time Calculation (min): 27 min  48 minutes missed  Short Term Goals: Week 1:  OT Short Term Goal 1 (Week 1): Pt will don pants with min A OT Short Term Goal 2 (Week 1): Pt will scan to midline to locate 1 item for grooming task with min cueing OT Short Term Goal 3 (Week 1): Pt will attend to bathing L side of body for 1 minute with min cueing  Skilled Therapeutic Interventions/Progress Updates:    Pt greeted in bed, asleep, woken with tactile cues. Declining bathing/dressing or OOB tasks. Placed a BSC beside bed, however pt also refused to attempt transfer. Max A for supine<sit with pt c/o stomach pain EOB, adamant about returning to bed. Back in supine pt reported no stomach pain. Attempted to establish rapport with pt, presenting family photos with her tablet. Changed position of tablet from Lt>Rt visual field with no visual scanning observed, though her eyes were open. Able to state the name of her granddaughter [name]. Tried to increase stimulation by playing a family video, however still no visual scanning noted. With initial HOH cuing, pt able to wash hands with sanitizer and wash her face. Pt intermittently falling asleep during stated tasks and required tactile cues to remain awake. At end of session pt remained sleeping in bed with all needs within reach and bed alarm set. Time missed due to lethargy and pt refusal to participate.    Therapy Documentation Precautions:  Precautions Precautions: Fall Precaution Comments: L neglect, L visual deficit (neglect vs. field cut) Restrictions Weight Bearing Restrictions: No Pain: Only s/s pain was when pt transferred EOB. Symptoms appeared to subside once she returned to supine.  Pain Assessment Pain Scale: 0-10 Pain Score: 0-No pain ADL:        Therapy/Group: Individual Therapy  Burdette Gergely A Larin Weissberg 02/27/2019, 9:53 AM

## 2019-02-28 ENCOUNTER — Inpatient Hospital Stay (HOSPITAL_COMMUNITY): Payer: Medicare Other | Admitting: Occupational Therapy

## 2019-02-28 ENCOUNTER — Inpatient Hospital Stay (HOSPITAL_COMMUNITY): Payer: Medicare Other | Admitting: Speech Pathology

## 2019-02-28 ENCOUNTER — Inpatient Hospital Stay (HOSPITAL_COMMUNITY): Payer: Medicare Other | Admitting: Physical Therapy

## 2019-02-28 DIAGNOSIS — A499 Bacterial infection, unspecified: Secondary | ICD-10-CM

## 2019-02-28 DIAGNOSIS — N390 Urinary tract infection, site not specified: Secondary | ICD-10-CM

## 2019-02-28 DIAGNOSIS — R339 Retention of urine, unspecified: Secondary | ICD-10-CM

## 2019-02-28 MED ORDER — SODIUM CHLORIDE 0.9 % IV SOLN
1.0000 g | INTRAVENOUS | Status: DC
  Administered 2019-02-28 – 2019-03-02 (×3): 1 g via INTRAVENOUS
  Filled 2019-02-28: qty 1
  Filled 2019-02-28: qty 10
  Filled 2019-02-28 (×2): qty 1

## 2019-02-28 NOTE — Progress Notes (Signed)
Patient appear to be restless, agitated, anxious to leave unit. Patient pulled out IV access to right arm, she stated she did not wont it in her arm and if we put another one in she will pull that one out also. IV team  assisted with placing another IV access. Patient continues IV fluids IV access is intact and secured, will continue to monitor.

## 2019-02-28 NOTE — Progress Notes (Signed)
Physical Therapy Session Note  Patient Details  Name: Renee Fritz MRN: [id number] Date of Birth: 1925/11/15  Today's Date: 02/28/2019 PT Individual Time: 1100-1130 PT Individual Time Calculation (min): 30 min   Short Term Goals: Week 1:  PT Short Term Goal 1 (Week 1): Pt will performed bed<>WC transfer with min assist and LRAD PT Short Term Goal 2 (Week 1): Pt will attend to L side 50% of the time to improve safety wihty functional mobility. PT Short Term Goal 3 (Week 1): Pt will ambulate 152ft with min assist and LRAD PT Short Term Goal 4 (Week 1): Pt will perform bed mobility with min assist consistently  Skilled Therapeutic Interventions/Progress Updates:   Pt received supine in bed, asleep. Aroused with great effort by PT. Supine>sit with max assist due to lack of initiation of movement by PT to come to EOB. Once in sitting, pt mildly more aroused but actively attempted to lie back down in bed. Mod assist and max cues for orientation to situation from PT to encourage pt to participate in therapy. Pt agreeable to eat while sitting EOB, but refused to put any food to mouth once in hand including muffin, egg, and doughnut. Pt then agreeable to get out of bed for toiletting. Sit<>stand with min-mod assist and max cues for safety. Gait in rom with RW towards bathroom, but pt stopped at recliner and sat down. Once sitting in recliner pt refused to engage in additional therapy and fell asleep. Max assist in sitting to improve position and prevent sliding out of chair; no assist from pt to aide in posterior scooting in reclincer. Pt left sitting recliner with daughter present.       Therapy Documentation Precautions:  Precautions Precautions: Fall Precaution Comments: L neglect, L visual deficit (neglect vs. field cut) Restrictions Weight Bearing Restrictions: No Pain: faces: hurts a little    Therapy/Group: Individual Therapy  [name] 02/28/2019, 5:36 PM

## 2019-02-28 NOTE — Progress Notes (Signed)
Speech Language Pathology Daily Session Note  Patient Details  Name: Renee Fritz MRN: [id number] Date of Birth: Jan 25, 1926  Today's Date: 02/28/2019 SLP Individual Time: 6384-6659 SLP Individual Time Calculation (min): 20 min and Today's Date: 02/28/2019 SLP Missed Time: 25 Minutes Missed Time Reason: Patient fatigue  Short Term Goals: Week 1: SLP Short Term Goal 1 (Week 1): Pt will sustain attention to basic familiar task for 2 minutes with Max A cues. SLP Short Term Goal 2 (Week 1): Pt will complete basic problem solving tasks related to ADLs with Max A. SLP Short Term Goal 3 (Week 1): Given Max A cues, pt wil recall orientation information in 4 out of 10 opportunities. SLP Short Term Goal 4 (Week 1): Pt will scan to midline to locate objects during familiar tasks in 4 out of 10 opportunities with Max A cues.  Skilled Therapeutic Interventions: Patient received skilled SLP services targeting cognitive goals. Patient participated in functional task targeting attention by brushing her teeth requiring max verbal cues to attend to task. Patient made no attempts to respond to orientation questions despite max cues. Patient also made no attempts to complete visual scanning task despite max cues. Therapy session was discontinued following 20 minutes as patient became fatigued, closed her eyes, and went to sleep.   Pain Pain Assessment Pain Scale: 0-10 Pain Score: Asleep Faces Pain Scale: No hurt  Therapy/Group: Individual Therapy  Cristy Folks 02/28/2019, 12:34 PM

## 2019-02-28 NOTE — Progress Notes (Signed)
Occupational Therapy Session Note  Patient Details  Name: Renee Fritz MRN: [id number] Date of Birth: 10/14/1925  Today's Date: 02/28/2019 OT Individual Time: 4128-7867 OT Individual Time Calculation (min): 72 min   Short Term Goals: Week 1:  OT Short Term Goal 1 (Week 1): Pt will don pants with min A OT Short Term Goal 2 (Week 1): Pt will scan to midline to locate 1 item for grooming task with min cueing OT Short Term Goal 3 (Week 1): Pt will attend to bathing L side of body for 1 minute with min cueing  Skilled Therapeutic Interventions/Progress Updates:    Pt greeted in recliner with dtr Arbie Cookey present. Pt had slid down in recliner and needed 2 assist to boost up using reciprocal scooting method. Pt refused to shower, attempt toileting, or change into new clothes, though highly encouraged by OT and dtr. Started tx with pt identifying family members in large photos that dtr brought. Educated dtr to place photos Lt of midline to promote Lt scanning. Pt required max multimodal cues for sustained attention to task due to perseveration on paying her bills. Alertness appeared to increase after participating in task. While seated, pt engaged in oral care, handwashing, face washing, and applying face cream at the sink. Pt required max multimodal and HOH cues to engage in stated tasks, max facilitation for forward weight shifting. Pt with very poor sustained attention. Discussed with dtr importance of using functional tasks for remediation of cognitive and physical deficits. Also discussed importance of daily orientation. We tried to have pt eat her lunch with max multimodal cuing, however pt would not eat, and continued with perseverative/tangential speech. Recommended for dtr to bring in sleep comfort items to help pt sleep at night. She states that she can do this. Brought pt lavender to use via inhalation to promote relaxation and calmness. Per dtr, pt loves the smell of lavender and it would remind her  of home. Total A for squat pivot<bed with 2nd helper stabilizing recliner. 2 assist for sit<supine and for boosting pt up in bed. Pt was repositioned for comfort and left with dtr to start a video chat with family. Tx focus placed on ADL retraining, attention, and family education.   Therapy Documentation Precautions:  Precautions Precautions: Fall Precaution Comments: L neglect, L visual deficit (neglect vs. field cut) Restrictions Weight Bearing Restrictions: No Pain: No s/s pain during session    ADL:       Therapy/Group: Individual Therapy  Dagan Heinz A Terelle Dobler 02/28/2019, 5:00 PM

## 2019-02-28 NOTE — Progress Notes (Signed)
[place] PHYSICAL MEDICINE & REHABILITATION PROGRESS NOTE   Subjective/Complaints:  Pt resting when I entered. When I awoke her she said she needed her "cane"   ROS: Limited due to cognitive/behavioral   Objective:   Ct Head Wo Contrast  Result Date: 02/27/2019 CLINICAL DATA:  Intracranial hemorrhage follow-up. EXAM: CT HEAD WITHOUT CONTRAST TECHNIQUE: Contiguous axial images were obtained from the base of the skull through the vertex without intravenous contrast. COMPARISON:  MRI brain dated February 22, 2019. CT head dated February 21, 2019. FINDINGS: Brain: Grossly unchanged right parietal hemorrhage measuring 3.0 x 4.1 x 4.6 cm, previously 3.3 x 4.1 x 4.6 cm. Adjacent small volume subarachnoid hemorrhage has decreased. No new hemorrhage. No midline shift. No hydrocephalus or extra-axial collection. Stable atrophy and chronic microvascular ischemic changes. Vascular: Calcified atherosclerosis at the skullbase. No hyperdense vessel. Skull: Negative for fracture or focal lesion. Sinuses/Orbits: No acute finding. Other: None. IMPRESSION: 1. Grossly unchanged right parietal hemorrhage. Adjacent small volume subarachnoid hemorrhage has decreased. No new hemorrhage. Electronically Signed   By: Titus Dubin M.D.   On: 02/27/2019 14:36   No results for input(s): WBC, HGB, HCT, PLT in the last 72 hours. No results for input(s): NA, K, CL, CO2, GLUCOSE, BUN, CREATININE, CALCIUM in the last 72 hours.  Intake/Output Summary (Last 24 hours) at 02/28/2019 1059 Last data filed at 02/28/2019 0927 Gross per 24 hour  Intake 623.51 ml  Output 900 ml  Net -276.49 ml     Physical Exam: Vital Signs Blood pressure 137/80, pulse 82, temperature 97.8 F (36.6 C), temperature source Oral, resp. rate 16, weight 41.2 kg, SpO2 97 %.   Constitutional: No distress . Vital signs reviewed. HEENT: EOMI, oral membranes moist Neck: supple Cardiovascular: RRR without murmur. No JVD    Respiratory: CTA Bilaterally  without wheezes or rales. Normal effort    GI: BS +, non-tender, non-distended  Extremities: No clubbing, cyanosis, or edema Skin: No evidence of breakdown, no evidence of rash Neurologic: alert, distracted, HOH. Difficult to redirect. Poor cooperation with manual muscle testing but remains 4/5 bilateral upper and lower limbs.  Sensory exam withdraws to pinch with BUE and BLE    Musculoskeletal:  No joint swelling Psych: anxious   Assessment/Plan: 1. 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  Physiatrist is providing close team supervision and 24 hour management of active medical problems listed below.  Physiatrist and rehab team continue to assess barriers to discharge/monitor patient progress toward functional and medical goals  Care Tool:  Bathing  Bathing activity did not occur: Refused           Bathing assist       Upper Body Dressing/Undressing Upper body dressing Upper body dressing/undressing activity did not occur (including orthotics): Refused      Upper body assist Assist Level: Moderate Assistance - Patient 50 - 74%    Lower Body Dressing/Undressing Lower body dressing    Lower body dressing activity did not occur: Refused What is the patient wearing?: Pants     Lower body assist Assist for lower body dressing: Maximal Assistance - Patient 25 - 49%     Toileting Toileting Toileting Activity did not occur Landscape architect and hygiene only): Refused  Toileting assist Assist for toileting: Dependent - Patient 0%     Transfers Chair/bed transfer  Transfers assist     Chair/bed transfer assist level: Minimal Assistance - Patient >  75%     Locomotion Ambulation   Ambulation assist      Assist level: Minimal Assistance - Patient > 75% Assistive device: Walker-rolling Max distance: 150   Walk 10 feet activity   Assist      Assist level: Minimal Assistance - Patient > 75% Assistive device: Walker-rolling   Walk 50 feet activity   Assist Walk 50 feet with 2 turns activity did not occur: Safety/medical concerns  Assist level: Minimal Assistance - Patient > 75% Assistive device: Walker-rolling    Walk 150 feet activity   Assist Walk 150 feet activity did not occur: Safety/medical concerns  Assist level: Minimal Assistance - Patient > 75% Assistive device: Walker-rolling    Walk 10 feet on uneven surface  activity   Assist Walk 10 feet on uneven surfaces activity did not occur: Safety/medical concerns         Wheelchair     Assist Will patient use wheelchair at discharge?: Yes Type of Wheelchair: Manual Wheelchair activity did not occur: Safety/medical concerns  Wheelchair assist level: Moderate Assistance - Patient 50 - 74% Max wheelchair distance: 2425'    Wheelchair 50 feet with 2 turns activity    Assist    Wheelchair 50 feet with 2 turns activity did not occur: Safety/medical concerns       Wheelchair 150 feet activity     Assist Wheelchair 150 feet activity did not occur: Safety/medical concerns        Medical Problem List and Plan: 1.Decreased functional mobilitysecondary to likelytraumaticright parietalICH(versus hypertensive etiology) CIR PT, OT SLP 2. Antithrombotics: -DVT/anticoagulation:SCDs -antiplatelet therapy: N/A 3. Pain Management:Tylenol as needed 4. Mood/mental status:BuSpar 5 mg twice daily, (Pamelor 10 mg nightly stopped) -antipsychotic agents: seroquel dc'ed (not taking anywa)  -ongoing restlessness esp at night  -confusion ongoing but not unexpected in a 85 yo with TBI  -continue sleep chart  -attempt to re-establish normal day-night cycle  -UA +++, UCX pending ---rx uti empirically with Ceftriaxone 5. Neuropsych: This patientiscapable of making decisions on herown  behalf. 6. Skin/Wound Care:Routine skin checks 7. Fluids/Electrolytes/Nutrition:Routine in and outs with follow-up chemistries 8. Hypertension. HCTZ 25 mg daily, Tenormin 100 mg twice daily, Norvasc 5 mg daily. Monitor with increased mobility Vitals:   02/27/19 1924 02/28/19 0521  BP: 113/69 137/80  Pulse: 82 82  Resp: 18 16  Temp: 98.6 F (37 C) 97.8 F (36.6 C)  SpO2: 97% 97%   toprol xl with improvement 9.Neurogenic bladder secondary to a fall 2007. Independent self caths. [facility] -continue I/O caths per RN until she improves neurologically 10. Ulcerative colitis. Mesalamine 800 mg 3 times daily 11. Hyperlipidemia. Vytorin currently on hold secondary to intracerebral hemorrhage  12.  Labs reviewed mild elevation of BUN, creat ok, CBC nl, poor intake   -continue IVF at noc              lOS: 4 days A FACE TO FACE EVALUATION WAS PERFORMED  Ranelle OysterZachary T Swartz 02/28/2019, 10:59 AM

## 2019-03-01 LAB — CBC
HCT: 43.1 % (ref 36.0–46.0)
Hemoglobin: 14.6 g/dL (ref 12.0–15.0)
MCH: 30 pg (ref 26.0–34.0)
MCHC: 33.9 g/dL (ref 30.0–36.0)
MCV: 88.7 fL (ref 80.0–100.0)
Platelets: 357 10*3/uL (ref 150–400)
RBC: 4.86 MIL/uL (ref 3.87–5.11)
RDW: 12.5 % (ref 11.5–15.5)
WBC: 11 10*3/uL — ABNORMAL HIGH (ref 4.0–10.5)
nRBC: 0 % (ref 0.0–0.2)

## 2019-03-01 MED ORDER — KCL IN DEXTROSE-NACL 40-5-0.45 MEQ/L-%-% IV SOLN
INTRAVENOUS | Status: DC
  Administered 2019-03-01 – 2019-03-03 (×3): via INTRAVENOUS
  Filled 2019-03-01 (×3): qty 1000

## 2019-03-01 MED ORDER — POTASSIUM CHLORIDE 10 MEQ/100ML IV SOLN
10.0000 meq | INTRAVENOUS | Status: AC
  Administered 2019-03-01 – 2019-03-02 (×3): 10 meq via INTRAVENOUS
  Filled 2019-03-01 (×3): qty 100

## 2019-03-01 MED ORDER — POTASSIUM CHLORIDE 10 MEQ/100ML IV SOLN
10.0000 meq | Freq: Once | INTRAVENOUS | Status: DC
  Filled 2019-03-01: qty 100

## 2019-03-01 NOTE — Progress Notes (Signed)
CRITICAL VALUE ALERT  Critical Value:  Potassium 2.6  Date & Time Notied:  03/01/19 1930  Provider Notified: Nurse Danielle Dess notified. MD Naaman Plummer also notified  Orders Received/Actions taken: New orders received.

## 2019-03-01 NOTE — Progress Notes (Signed)
[place] PHYSICAL MEDICINE & REHABILITATION PROGRESS NOTE   Subjective/Complaints:  Pt rested most of night. Sleeping when I entered.   ROS: Limited due to cognitive/behavioral    Objective:   Ct Head Wo Contrast  Result Date: 02/27/2019 CLINICAL DATA:  Intracranial hemorrhage follow-up. EXAM: CT HEAD WITHOUT CONTRAST TECHNIQUE: Contiguous axial images were obtained from the base of the skull through the vertex without intravenous contrast. COMPARISON:  MRI brain dated February 22, 2019. CT head dated February 21, 2019. FINDINGS: Brain: Grossly unchanged right parietal hemorrhage measuring 3.0 x 4.1 x 4.6 cm, previously 3.3 x 4.1 x 4.6 cm. Adjacent small volume subarachnoid hemorrhage has decreased. No new hemorrhage. No midline shift. No hydrocephalus or extra-axial collection. Stable atrophy and chronic microvascular ischemic changes. Vascular: Calcified atherosclerosis at the skullbase. No hyperdense vessel. Skull: Negative for fracture or focal lesion. Sinuses/Orbits: No acute finding. Other: None. IMPRESSION: 1. Grossly unchanged right parietal hemorrhage. Adjacent small volume subarachnoid hemorrhage has decreased. No new hemorrhage. Electronically Signed   By: Obie DredgeWilliam T Derry M.D.   On: 02/27/2019 14:36   No results for input(s): WBC, HGB, HCT, PLT in the last 72 hours. No results for input(s): NA, K, CL, CO2, GLUCOSE, BUN, CREATININE, CALCIUM in the last 72 hours.  Intake/Output Summary (Last 24 hours) at 03/01/2019 1023 Last data filed at 03/01/2019 16100619 Gross per 24 hour  Intake 20 ml  Output 950 ml  Net -930 ml     Physical Exam: Vital Signs Blood pressure (!) 150/73, pulse 90, temperature 97.9 F (36.6 C), temperature source Oral, resp. rate 20, weight 41.2 kg, SpO2 96 %.   Constitutional: No distress . Vital signs reviewed. HEENT: EOMI, oral membranes moist Neck: supple Cardiovascular: RRR without murmur. No JVD    Respiratory: CTA Bilaterally without wheezes or rales.  Normal effort    GI: BS +, non-tender, non-distended  Extremities: No clubbing, cyanosis, or edema Skin: No evidence of breakdown, no evidence of rash Neurologic: awakens fairly easily. HOH. Still somewhat difficult to redirect. Poor cooperation with manual muscle testing but remains 4/5 bilateral upper and lower limbs.  Sensory exam withdraws to pinch with BUE and BLE    Musculoskeletal:  No joint swelling Psych: distracted   Assessment/Plan: 1. 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  Physiatrist is providing close team supervision and 24 hour management of active medical problems listed below.  Physiatrist and rehab team continue to assess barriers to discharge/monitor patient progress toward functional and medical goals  Care Tool:  Bathing  Bathing activity did not occur: Refused           Bathing assist       Upper Body Dressing/Undressing Upper body dressing Upper body dressing/undressing activity did not occur (including orthotics): Refused      Upper body assist Assist Level: Moderate Assistance - Patient 50 - 74%    Lower Body Dressing/Undressing Lower body dressing    Lower body dressing activity did not occur: Refused What is the patient wearing?: Pants     Lower body assist Assist for lower body dressing: Maximal Assistance - Patient 25 - 49%     Toileting Toileting Toileting Activity did not occur Press photographer(Clothing management and hygiene only): Refused  Toileting assist Assist for toileting: Dependent - Patient 0%     Transfers Chair/bed transfer  Transfers assist     Chair/bed transfer assist level: Minimal Assistance - Patient >  75%     Locomotion Ambulation   Ambulation assist      Assist level: Minimal Assistance - Patient > 75% Assistive device: Walker-rolling Max distance: 150   Walk 10 feet activity   Assist      Assist level: Minimal Assistance - Patient > 75% Assistive device: Walker-rolling   Walk 50 feet activity   Assist Walk 50 feet with 2 turns activity did not occur: Safety/medical concerns  Assist level: Minimal Assistance - Patient > 75% Assistive device: Walker-rolling    Walk 150 feet activity   Assist Walk 150 feet activity did not occur: Safety/medical concerns  Assist level: Minimal Assistance - Patient > 75% Assistive device: Walker-rolling    Walk 10 feet on uneven surface  activity   Assist Walk 10 feet on uneven surfaces activity did not occur: Safety/medical concerns         Wheelchair     Assist Will patient use wheelchair at discharge?: Yes Type of Wheelchair: Manual Wheelchair activity did not occur: Safety/medical concerns  Wheelchair assist level: Moderate Assistance - Patient 50 - 74% Max wheelchair distance: 82'    Wheelchair 50 feet with 2 turns activity    Assist    Wheelchair 50 feet with 2 turns activity did not occur: Safety/medical concerns       Wheelchair 150 feet activity     Assist Wheelchair 150 feet activity did not occur: Safety/medical concerns        Medical Problem List and Plan: 1.Decreased functional mobilitysecondary to likelytraumaticright parietalICH(versus hypertensive etiology) CIR PT, OT SLP 2. Antithrombotics: -DVT/anticoagulation:SCDs -antiplatelet therapy: N/A 3. Pain Management:Tylenol as needed 4. Mood/mental status:BuSpar 5 mg twice daily, (Pamelor 10 mg nightly stopped) -antipsychotic agents: seroquel dc'ed (hadn't taken anyway)  -slept better last night  -confusion ongoing but not unexpected in a 85 yo with TBI  -continue sleep chart  -attempt to re-establish normal day-night cycle  -UA +++, UCX with 100GNR ---continue to rx empirically with Ceftriaxone 5. Neuropsych: This patientiscapable of making decisions on herown  behalf. 6. Skin/Wound Care:Routine skin checks 7. Fluids/Electrolytes/Nutrition:Routine in and outs with follow-up chemistries 8. Hypertension. HCTZ 25 mg daily, Tenormin 100 mg twice daily, Norvasc 5 mg daily. Monitor with increased mobility Vitals:   02/28/19 2137 03/01/19 0614  BP: (!) 163/90 (!) 150/73  Pulse: (!) 125 90  Resp: 16 20  Temp: 97.7 F (36.5 C) 97.9 F (36.6 C)  SpO2: 96% 96%   toprol xl with improvement but still borderline--monitor today 9.Neurogenic bladder secondary to a fall 2007. Independent self caths. Followed by Dr. Venia Minks [facility] urology -continue I/O caths per RN until she improves neurologically 10. Ulcerative colitis. Mesalamine 800 mg 3 times daily 11. Hyperlipidemia. Vytorin currently on hold secondary to intracerebral hemorrhage  12. Prerenal azotemia: CBC nl, poor intake   -continue IVF at noc             -recheck labs tomorrow   lOS: 5 days A FACE TO FACE EVALUATION WAS PERFORMED  Meredith Staggers 03/01/2019, 10:23 AM

## 2019-03-01 NOTE — Progress Notes (Signed)
Pt slept most of the day, till finally being fully awoke at 1800. Pt daughter Arbie Cookey, was visiting most of the day. The staff, along with daughter tried on multiple occassions to arouse pt and made attempts to feed and hydrate pt. Pt was confused and did not recognize anyone around her including her daughter. Pt was given continuous IV fluids as daughter attempted to feed her dinner. Pt continues to be monitored. Doy Hutching, LPN

## 2019-03-02 ENCOUNTER — Inpatient Hospital Stay (HOSPITAL_COMMUNITY): Payer: Medicare Other | Admitting: Occupational Therapy

## 2019-03-02 ENCOUNTER — Inpatient Hospital Stay (HOSPITAL_COMMUNITY): Payer: Medicare Other | Admitting: Physical Therapy

## 2019-03-02 ENCOUNTER — Inpatient Hospital Stay (HOSPITAL_COMMUNITY): Payer: Medicare Other | Admitting: Speech Pathology

## 2019-03-02 DIAGNOSIS — G479 Sleep disorder, unspecified: Secondary | ICD-10-CM

## 2019-03-02 DIAGNOSIS — Z515 Encounter for palliative care: Secondary | ICD-10-CM

## 2019-03-02 DIAGNOSIS — E876 Hypokalemia: Secondary | ICD-10-CM

## 2019-03-02 DIAGNOSIS — N390 Urinary tract infection, site not specified: Secondary | ICD-10-CM

## 2019-03-02 DIAGNOSIS — E871 Hypo-osmolality and hyponatremia: Secondary | ICD-10-CM

## 2019-03-02 DIAGNOSIS — R7303 Prediabetes: Secondary | ICD-10-CM

## 2019-03-02 DIAGNOSIS — S06340A Traumatic hemorrhage of right cerebrum without loss of consciousness, initial encounter: Secondary | ICD-10-CM

## 2019-03-02 DIAGNOSIS — R0989 Other specified symptoms and signs involving the circulatory and respiratory systems: Secondary | ICD-10-CM

## 2019-03-02 DIAGNOSIS — Z66 Do not resuscitate: Secondary | ICD-10-CM

## 2019-03-02 LAB — BASIC METABOLIC PANEL
Anion gap: 15 (ref 5–15)
Anion gap: 11 (ref 5–15)
BUN: 24 mg/dL — ABNORMAL HIGH (ref 8–23)
BUN: 23 mg/dL (ref 8–23)
CO2: 27 mmol/L (ref 22–32)
CO2: 27 mmol/L (ref 22–32)
Calcium: 9.3 mg/dL (ref 8.9–10.3)
Calcium: 8.7 mg/dL — ABNORMAL LOW (ref 8.9–10.3)
Chloride: 94 mmol/L — ABNORMAL LOW (ref 98–111)
Chloride: 96 mmol/L — ABNORMAL LOW (ref 98–111)
Creatinine, Ser: 0.64 mg/dL (ref 0.44–1.00)
Creatinine, Ser: 0.55 mg/dL (ref 0.44–1.00)
GFR calc Af Amer: >60 mL/min (ref >60)
GFR calc Af Amer: >60 mL/min (ref >60)
GFR calc non Af Amer: >60 mL/min (ref >60)
GFR calc non Af Amer: >60 mL/min (ref >60)
Glucose, Bld: 202 mg/dL — ABNORMAL HIGH (ref 70–99)
Glucose, Bld: 163 mg/dL — ABNORMAL HIGH (ref 70–99)
Potassium: 2.6 mmol/L — CL (ref 3.5–5.1)
Potassium: 3.4 mmol/L — ABNORMAL LOW (ref 3.5–5.1)
Sodium: 136 mmol/L (ref 135–145)
Sodium: 134 mmol/L — ABNORMAL LOW (ref 135–145)

## 2019-03-02 LAB — CBC
HCT: 39.5 % (ref 36.0–46.0)
Hemoglobin: 13.2 g/dL (ref 12.0–15.0)
MCH: 29.8 pg (ref 26.0–34.0)
MCHC: 33.4 g/dL (ref 30.0–36.0)
MCV: 89.2 fL (ref 80.0–100.0)
Platelets: 359 10*3/uL (ref 150–400)
RBC: 4.43 MIL/uL (ref 3.87–5.11)
RDW: 12.4 % (ref 11.5–15.5)
WBC: 10.6 10*3/uL — ABNORMAL HIGH (ref 4.0–10.5)
nRBC: 0 % (ref 0.0–0.2)

## 2019-03-02 LAB — URINE CULTURE: Culture: >=100000 — AB

## 2019-03-02 MED ORDER — MELATONIN 3 MG PO TABS
1.5000 mg | ORAL_TABLET | Freq: Every day | ORAL | Status: DC
  Administered 2019-03-02: 20:00:00 1.5 mg via ORAL
  Filled 2019-03-02 (×2): qty 0.5

## 2019-03-02 NOTE — Progress Notes (Signed)
[place] PHYSICAL MEDICINE & REHABILITATION PROGRESS NOTE   Subjective/Complaints: Patient seen laying in bed this morning.  She slept well after 2 AM per sleep chart.  She remains confused.  ROS: Limited due to cognition   Objective:   No results found. Recent Labs    03/01/19 1825 03/02/19 0739  WBC 11.0* 10.6*  HGB 14.6 13.2  HCT 43.1 39.5  PLT 357 359   Recent Labs    03/01/19 1825 03/02/19 0739  NA 136 134*  K 2.6* 3.4*  CL 94* 96*  CO2 27 27  GLUCOSE 202* 163*  BUN 24* 23  CREATININE 0.64 0.55  CALCIUM 9.3 8.7*    Intake/Output Summary (Last 24 hours) at 03/02/2019 1143 Last data filed at 03/02/2019 0845 Gross per 24 hour  Intake 0 ml  Output 800 ml  Net -800 ml     Physical Exam: Vital Signs Blood pressure 129/70, pulse 82, temperature 97.6 F (36.4 C), resp. rate 14, weight 44 kg, SpO2 98 %. Constitutional: No distress . Vital signs reviewed. HENT: Normocephalic.  Atraumatic. Eyes: EOMI. No discharge. Cardiovascular: No JVD. Respiratory: Normal effort. GI: Non-distended. Musc: No edema or tenderness in extremities. Neurologic: Alert HOH  Motor: Limited due to participation, but seen spontaneously moving extremities.  Skin: Warm and dry.  Intact. Psych: Unable to assess due to cognition  Assessment/Plan: 1. 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  Physiatrist is providing close team supervision and 24 hour management of active medical problems listed below.  Physiatrist and rehab team continue to assess barriers to discharge/monitor patient progress toward functional and medical goals  Care Tool:  Bathing  Bathing activity did not occur: Refused           Bathing assist       Upper Body Dressing/Undressing Upper body dressing Upper body dressing/undressing activity did not occur (including orthotics): Refused       Upper body assist Assist Level: Moderate Assistance - Patient 50 - 74%    Lower Body Dressing/Undressing Lower body dressing    Lower body dressing activity did not occur: Refused What is the patient wearing?: Pants     Lower body assist Assist for lower body dressing: Maximal Assistance - Patient 25 - 49%     Toileting Toileting Toileting Activity did not occur Landscape architect and hygiene only): Refused  Toileting assist Assist for toileting: Dependent - Patient 0%     Transfers Chair/bed transfer  Transfers assist     Chair/bed transfer assist level: Minimal Assistance - Patient > 75%     Locomotion Ambulation   Ambulation assist      Assist level: Minimal Assistance - Patient > 75% Assistive device: Walker-rolling Max distance: 150   Walk 10 feet activity   Assist     Assist level: Minimal Assistance - Patient > 75% Assistive device: Walker-rolling   Walk 50 feet activity   Assist Walk 50 feet with 2 turns activity did not occur: Safety/medical concerns  Assist level: Minimal Assistance - Patient > 75% Assistive device: Walker-rolling    Walk 150 feet activity   Assist Walk 150 feet activity did not occur: Safety/medical concerns  Assist level: Minimal Assistance - Patient > 75% Assistive device: Walker-rolling    Walk 10 feet on uneven surface  activity   Assist Walk 10 feet on uneven surfaces activity did not occur: Safety/medical concerns  Wheelchair     Assist Will patient use wheelchair at discharge?: Yes Type of Wheelchair: Manual Wheelchair activity did not occur: Safety/medical concerns  Wheelchair assist level: Moderate Assistance - Patient 50 - 74% Max wheelchair distance: 4825'    Wheelchair 50 feet with 2 turns activity    Assist    Wheelchair 50 feet with 2 turns activity did not occur: Safety/medical concerns       Wheelchair 150 feet activity     Assist Wheelchair 150 feet activity  did not occur: Safety/medical concerns        Medical Problem List and Plan: 1.Decreased functional mobilitysecondary to likelytraumaticright parietalICH(versus hypertensive etiology)  Continue CIR  Discussed with team, will discuss with palliative care  Notes reviewed- right parietal ICH due to trauma versus hypertension, images personally reviewed- moderate/large right parietal ICH, labs personally reviewed 2. Antithrombotics: -DVT/anticoagulation:SCDs -antiplatelet therapy: N/A 3. Pain Management:Tylenol as needed 4. Mood/mental status:BuSpar 5 mg twice daily, (Pamelor 10 mg nightly stopped) Antipsychotic agents: seroquel dc'ed  continue sleep chart  Re-establish normal day-night cycle 5. Neuropsych: This patientis notcapable of making decisions on herown behalf. 6. Skin/Wound Care:Routine skin checks 7. Fluids/Electrolytes/Nutrition:Routine in and outs 8. Hypertension. HCTZ 25 mg daily, Norvasc 5 mg daily. Monitor with increased mobility Vitals:   03/01/19 1929 03/02/19 0625  BP: 105/89 129/70  Pulse: 92 82  Resp: 14 14  Temp: 98 F (36.7 C) 97.6 F (36.4 C)  SpO2: 95% 98%   Toprolol 50 daily  Labile on 6/29 9.Neurogenic bladder secondary to a fall 2007. Independent self caths. [facility] -continue I/O caths  10. Ulcerative colitis. Mesalamine 800 mg 3 times daily 11. Hyperlipidemia. Vytorin currently on hold secondary to intracerebral hemorrhage  12. Prerenal azotemia: Poor intake   -continue IVF at noc            13.  Sleep disturbance  See #4  Melatonin started on 6/29 14.  Prediabetes  Labile on 6/29 15.  Hyponatremia  Sodium 134 on 6/29  Continue to monitor 16.  Hypokalemia  Potassium 3.4 on 6/29, labs ordered tomorrow  Supplementing with IVF  Continue monitor 17.  Acute lower UTI  WBCs 10.6 on 6/29  UA +, UCX with greater than 100,000 Klebsiella  pneumonia  Continue ceftriaxone  LOS: 6 days A FACE TO FACE EVALUATION WAS PERFORMED  Luzmaria Devaux Karis Jubanil Carigan Lister 03/02/2019, 11:43 AM

## 2019-03-02 NOTE — Progress Notes (Signed)
Speech Language Pathology Daily Session Note  Patient Details  Name: Brynlei Klausner MRN: [id number] Date of Birth: 1926/06/09  Today's Date: 03/02/2019 SLP Individual Time: 0945-1010 SLP Individual Time Calculation (min): 25 min  Short Term Goals: Week 1: SLP Short Term Goal 1 (Week 1): Pt will demonstrate joint attention to item for ~ 2 minutes with Max A cues. SLP Short Term Goal 1 - Progress (Week 1): Revised due to lack of progress SLP Short Term Goal 2 (Week 1): Pt will complete basic problem solving tasks related to ADLs with Max A. SLP Short Term Goal 3 (Week 1): Given Max A cues, pt wil recall orientation information in 4 out of 10 opportunities. SLP Short Term Goal 3 - Progress (Week 1): Discontinued (comment)(severity of confusion) SLP Short Term Goal 4 (Week 1): Pt will scan to midline to locate objects during familiar tasks in 4 out of 10 opportunities with Max A cues. SLP Short Term Goal 4 - Progress (Week 1): Discontinued (comment)(d/t severity of impairments)  Skilled Therapeutic Interventions:  Pt continues with severe confusion. SLP promoted joint attention to magazine with Total A for redirection to task and for joint attention. Education provided to daughter on ways to promote pleasure joint attention to magazine. Pt left asleep in bed.   Pt's goals are downgraded d/t decline with family requesting Palliative Care consult. Support provided.      Pain    Therapy/Group: Individual Therapy  Gabriel Conry 03/02/2019, 12:16 PM

## 2019-03-02 NOTE — Progress Notes (Signed)
Occupational Therapy Session Note  Patient Details  Name: Lemmie Steinhaus MRN: [id number] Date of Birth: 1925/09/09  Today's Date: 03/02/2019 OT missed time: 60 minutes    Short Term Goals: Week 1:  OT Short Term Goal 1 (Week 1): Pt will don pants with min A OT Short Term Goal 2 (Week 1): Pt will scan to midline to locate 1 item for grooming task with min cueing OT Short Term Goal 3 (Week 1): Pt will attend to bathing L side of body for 1 minute with min cueing  Skilled Therapeutic Interventions/Progress Updates:    Pt greeted in bed, lethargic with minimal eye opening. RN and dtr Arbie Cookey present. Checked with RN regarding pts critical potassium value. Per RN, no updates from lab at this time. 60 minutes missed. OT will make up time when pt is medically stable for tx.   Therapy Documentation Precautions:  Precautions Precautions: Fall Precaution Comments: L neglect, L visual deficit (neglect vs. field cut) Restrictions Weight Bearing Restrictions: No ADL:       Therapy/Group: Individual Therapy  Kajol Crispen A Iyauna Sing 03/02/2019, 11:53 AM

## 2019-03-02 NOTE — Consult Note (Signed)
Consultation Note Date: 03/02/2019   Patient Name: Renee Fritz  DOB: September 17, 1925  MRN: [id number]  Age / Sex: 85 y.o., female  PCP: Houston Siren., MD Referring Physician: Charlett Blake, MD  Reason for Consultation: Establishing goals of care and Psychosocial/spiritual support  HPI/Patient Profile: 85 y.o. female  admitted on 02/24/2019 with past medical   history of hypertension,ulcerative colitis, neurogenic bladder (followed by urology services [facility]), s/p fall.    Presented 02/21/2019 with altered mental status. Son had reported she recently stumbled and struck her left forehead into the wall. Cranial CT scan showed acute right parietal hemorrhage 41 x 33 x 46 mm with mild surrounding edema but no right to left shift or intraventricular extension. Slight subarachnoid blood. CT cervical spine negative. CT angiogram of the head with no large vessel occlusion. Follow-up MRI again shows right parietal hemorrhage not increased in size from prior tracings. No visible midline shift.   Now in CIR rehabilitation  And not progressing as family had hoped.  With increased confusion, poor po intake  PTA she lives alone in [place]. She has a home health aide 2 days a week. Local family check on her as needed. She performs self caths independently with noted history of urinary ostomy.   Family face treatment options decisions, advanced directive decisions and anticipatory care needs.    Clinical Assessment and Goals of Care:  This NP Wadie Lessen reviewed medical records, received report from team, assessed the patient and then meet at the patient's bedside along with daughter Renee Fritz to discuss diagnosis, prognosis, GOC, EOL wishes disposition and options.  Concept of Hospice and Palliative Care were discussed  A detailed discussion was had today  regarding advanced directives.  Concepts specific to code status, artifical feeding and hydration, continued IV antibiotics and rehospitalization was had.  The difference between a aggressive medical intervention path  and a palliative comfort care path for this patient at this time was had.  Values and goals of care important to patient and family were attempted to be elicited.  MOST form completed, DNR docuemnted  Natural trajectory and expectations at EOL were discussed.  Questions and concerns addressed.   Family encouraged to call with questions or concerns.    PMT will continue to support holistically.    HCPOA    SUMMARY OF RECOMMENDATIONS    Code Status/Advance Care Planning:  DNR- documented today   Palliative Prophylaxis:   Aspiration, Bowel Regimen, Frequent Pain Assessment and Oral Care  Additional Recommendations (Limitations, Scope, Preferences):  Full Comfort Care   Continue IV fluid and antibiotics until discharge and then no further artifical hydration or IV antibiotic use  Minimize medications, symptom management  No rehospitalization  Psycho-social/Spiritual:   Desire for further Chaplaincy support:no  Additional Recommendations: Education on Hospice  Prognosis:   < 6 months  Discharge Planning: Family plan to have 24/7 caregivers in the home along with hospice services and family is involved as needed   Home with  Hospice      Primary Diagnoses: Present on Admission: **None**   I have reviewed the medical record, interviewed the patient and family, and examined the patient. The following aspects are pertinent.  Past Medical History:  Diagnosis Date  . Hypertension   . Incarcerated femoral hernia 07/03/2017  . Osteoporosis   . Other specified cardiac arrhythmias   . Pneumonia   . Ulcerative (chronic) enterocolitis (HCC)    Social History   Socioeconomic History  . Marital status: Married    Spouse name: Not on file  . Number of  children: Not on file  . Years of education: Not on file  . Highest education level: Not on file  Occupational History  . Not on file  Social Needs  . Financial resource strain: Not on file  . Food insecurity    Worry: Not on file    Inability: Not on file  . Transportation needs    Medical: Not on file    Non-medical: Not on file  Tobacco Use  . Smoking status: Never Smoker  . Smokeless tobacco: Never Used  Substance and Sexual Activity  . Alcohol use: No  . Drug use: No  . Sexual activity: Not on file  Lifestyle  . Physical activity    Days per week: Not on file    Minutes per session: Not on file  . Stress: Not on file  Relationships  . Social Musicianconnections    Talks on phone: Not on file    Gets together: Not on file    Attends religious service: Not on file    Active member of club or organization: Not on file    Attends meetings of clubs or organizations: Not on file    Relationship status: Not on file  Other Topics Concern  . Not on file  Social History Narrative  . Not on file   Family History  Family history unknown: Yes   Scheduled Meds: . amLODipine  5 mg Oral Daily  . busPIRone  5 mg Oral BID  . feeding supplement (ENSURE ENLIVE)  237 mL Oral BID BM  . hydrocortisone  25 mg Rectal BID  . mouth rinse  15 mL Mouth Rinse BID  . Melatonin  1.5 mg Oral QHS  . Mesalamine  800 mg Oral TID WC  . metoprolol succinate  50 mg Oral Daily  . pantoprazole  40 mg Oral QHS  . senna-docusate  1 tablet Oral BID   Continuous Infusions: . cefTRIAXone (ROCEPHIN)  IV 1 g (03/01/19 1152)  . dextrose 5 % and 0.45 % NaCl with KCl 40 mEq/L 75 mL/hr at 03/02/19 0943   PRN Meds:.acetaminophen **OR** acetaminophen (TYLENOL) oral liquid 160 mg/5 mL **OR** acetaminophen, sorbitol Medications Prior to Admission:  Prior to Admission medications   Medication Sig Start Date End Date Taking? Authorizing Provider  atenolol (TENORMIN) 100 MG tablet Take 100 mg by mouth 2 (two) times  daily.     [provider]  B-Complex-C-Biotin-Minerals-FA (FOLBEE PLUS CZ) 5 MG TABS Take 1 tablet by mouth daily.    [provider]  busPIRone (BUSPAR) 5 MG tablet Take 5 mg by mouth 2 (two) times daily as needed (anxiety).     [provider]  Calcium-Magnesium-Vitamin D (CALCIUM 1200+D3 PO) Take 1 tablet by mouth daily.    [provider]  cetirizine (ZYRTEC) 10 MG tablet Take 10 mg by mouth daily as needed for allergies.    [provider]  Coenzyme Q10 (  COQ10) 30 MG CAPS Take 30 mg by mouth daily.    [provider]  denosumab (PROLIA) 60 MG/ML SOSY injection Inject 60 mg into the skin every 6 (six) months.    [provider]  hydrocortisone (ANUSOL-HC) 25 MG suppository Place 25 mg rectally 2 (two) times daily as needed for hemorrhoids or anal itching.     [provider]  Mesalamine (ASACOL HD) 800 MG TBEC Take 800 mg by mouth 3 (three) times daily.    [provider]  nortriptyline (PAMELOR) 10 MG capsule Take 20 mg by mouth at bedtime.    [provider]  Omega-3 1000 MG CAPS Take 1 tablet by mouth daily.    [provider]  ondansetron (ZOFRAN-ODT) 8 MG disintegrating tablet Take 8 mg by mouth every 8 (eight) hours as needed for nausea or vomiting.    [provider]  potassium chloride (K-DUR,KLOR-CON) 10 MEQ tablet Take 10 mEq by mouth 2 (two) times daily.    [provider]  psyllium (METAMUCIL) 58.6 % packet Take 1 packet by mouth daily.    [provider]  RABEprazole (ACIPHEX) 20 MG tablet Take 20 mg by mouth daily.     [provider]   Allergies  Allergen Reactions  . Estrace [Estradiol] Other (See Comments)    burning  . Pepcid [Famotidine] Other (See Comments)    confusion  . Phenergan [Promethazine Hcl] Other (See Comments)    Feels like bugs crawling all over  . Tramadol   . Vancomycin Diarrhea and Other (See Comments)    Stomach  pains  . Erythromycin-Sulfisoxazole Rash  . Sulfa Antibiotics Rash   Review of Systems  Unable to perform ROS: Mental status change    Physical Exam Constitutional:      Appearance: She is underweight. She is ill-appearing.  Cardiovascular:     Rate and Rhythm: Regular rhythm.     Heart sounds: Normal heart sounds.  Pulmonary:     Breath sounds: Decreased breath sounds present.  Musculoskeletal:     Comments: Generalized weakness   Skin:    General: Skin is warm and dry.  Neurological:     Mental Status: She is lethargic.     Vital Signs: BP 129/70 (BP Location: Left Arm)   Pulse 82   Temp 97.6 F (36.4 C)   Resp 14   Wt 44 kg   SpO2 98%   BMI 18.94 kg/m  Pain Scale: 0-10   Pain Score: 0-No pain   SpO2: SpO2: 98 % O2 Device:SpO2: 98 % O2 Flow Rate: .   IO: Intake/output summary:   Intake/Output Summary (Last 24 hours) at 03/02/2019 1238 Last data filed at 03/02/2019 0845 Gross per 24 hour  Intake 0 ml  Output 800 ml  Net -800 ml    LBM: Last BM Date: 02/25/19 Baseline Weight: Weight: 41.3 kg Most recent weight: Weight: 44 kg     Palliative Assessment/Data: 30 % at best   Discussed with Becky LCSW  Time In: 1145 Time Out: 1300 Time Total: 75 minutes Greater than 50%  of this time was spent counseling and coordinating care related to the above assessment and plan.  Signed by: Lorinda CreedMary Nayra Coury, NP   Please contact Palliative Medicine Team phone at [phone] for questions and concerns.  For individual provider: See Loretha StaplerAmion

## 2019-03-02 NOTE — Progress Notes (Signed)
Social Work Patient ID: Renee Fritz, female   DOB: Apr 25, 1926, 85 y.o.   MRN: [id number]  Talked with Mary-Palliative care to discuss plan. She has discussed with daughter and pt the plan. Prefers [place], have made referral with [facility] and she will be contacting daughter. Have let daughter know this and will work on discharge for tomorrow or Wed.

## 2019-03-02 NOTE — Progress Notes (Signed)
Social Work Patient ID: Renee Fritz, female   DOB: [birth year], 85 y.o.   MRN: [id number] Met with daughter who has spoken with [facility] who has accepted the case and will deliver equipment to home today. Will place foley prior to discharge home. Daughter reports she will have 24 hr care at home. Will see in am and make sure all paperwork is completed and schedule non-ambulance home.

## 2019-03-02 NOTE — Progress Notes (Signed)
Physical Therapy Discharge Summary  Patient Details  Name: Renee Fritz MRN: [id number] Date of Birth: 1926/05/20  Today's Date: 03/02/2019    Patient has met 0 of 10 long term goals due to pt with continued functional decline, pt will d/c home with palliative care. Patient to discharge at home with palliative services level Mod Assist.   Patient's care partner is independent to provide the necessary physical and cognitive assistance at discharge.  Reasons goals not met: Pt to d/c home with palliative services due to continued functional decline.   Recommendation:  Patient will benefit from ongoing skilled PT services in Hospice care to continue to advance safe functional mobility, address ongoing impairments in safety, awareness, and minimize fall risk.  Equipment: No equipment provided  Reasons for discharge: discharge from hospital  Patient/family agrees with progress made and goals achieved: Yes  PT Discharge Precautions/Restrictions Precautions Precaution Comments: L neglect, L visual deficit (neglect vs. field cut) Vital Signs Therapy Vitals Temp: 98.1 F (36.7 C) Temp Source: Oral Pulse Rate: 81 Resp: 16 BP: (!) 153/67 Patient Position (if appropriate): Lying Oxygen Therapy SpO2: 98 % O2 Device: Room Air Pain Pain Assessment Pain Scale: Faces Pain Score: 0-No pain Faces Pain Scale: No hurt  Cognition Overall Cognitive Status: Impaired/Different from baseline Arousal/Alertness: Lethargic Orientation Level: Oriented to person Motor  Motor Motor: Hemiplegia Motor - Discharge Observations: L inattention and functional decline  Mobility Bed Mobility Supine to Sit: Moderate Assistance - Patient 50-74% Sit to Supine: Maximal Assistance - Patient 25-49% Transfers Transfers: Sit to Stand;Stand Pivot Transfers Sit to Stand: Maximal Assistance - Patient 25-49% Stand Pivot Transfers: Moderate Assistance - Patient 50 - 74% Stand Pivot Transfer Details (indicate  cue type and reason): Pt alert to perform stand pivot requiring mod/maxA with RW Transfer (Assistive device): Rolling walker Locomotion  Gait Ambulation: No(Pt with functional decline since eval) Wheelchair Mobility Wheelchair Mobility: No  Trunk/Postural Assessment  Cervical Assessment Cervical Assessment: Exceptions to WFL(forward head) Thoracic Assessment Thoracic Assessment: Exceptions to WFL(rounded shoulders) Lumbar Assessment Lumbar Assessment: Exceptions to WFL(posterior pelvic tilt) Postural Control Postural Control: Deficits on evaluation Righting Reactions: delayed  Balance Balance Balance Assessed: Yes Static Sitting Balance Static Sitting - Balance Support: Feet supported;Bilateral upper extremity supported Static Sitting - Level of Assistance: 3: Mod assist Static Sitting - Comment/# of Minutes: able to sit at EOB with truncal support and noted L posteriorlateral lean Dynamic Sitting Balance Dynamic Sitting - Balance Support: Feet supported Dynamic Sitting - Level of Assistance: 3: Mod assist Static Standing Balance Static Standing - Balance Support: Bilateral upper extremity supported Static Standing - Level of Assistance: 2: Max assist Dynamic Standing Balance Dynamic Standing - Balance Support: During functional activity Dynamic Standing - Level of Assistance: Not tested (comment)(Pt with limited standing tolerance to perform transfer to recliner due to recent functional decline) Extremity Assessment  RUE Assessment RUE Assessment: Within Functional Limits LUE Assessment LUE Assessment: Exceptions to [facility] General Strength Comments: Mild L hemi. Pt has full AROM, 4-/5 strength. Decreased coordination, slight ataxia, and inattention RLE Assessment RLE Assessment: Within Functional Limits LLE Assessment LLE Assessment: Exceptions to [facility] General Strength Comments: grossly 3/5 possibly limited due to decreased attention to task    Rosita DeChalus 03/02/2019,  4:18 PM

## 2019-03-02 NOTE — Plan of Care (Signed)
  Problem: RH Cognition - SLP Goal: RH LTG Patient will demonstrate orientation with cues Description:  LTG:  Patient will demonstrate orientation to person/place/time/situation with cues (SLP)   Outcome: Not Applicable Note: Pt's continued confusion prevents pt from retaining any orientation information, she is dependent and goals no longer appropriate   Problem: RH Problem Solving Goal: LTG Patient will demonstrate problem solving for (SLP) Description: LTG:  Patient will demonstrate problem solving for basic/complex daily situations with cues  (SLP) Flowsheets (Taken 03/02/2019 1151) LTG Patient will demonstrate problem solving for: Total Assistance - Patient < 25% Note: Decline in pt's function, pt currently dependent   Problem: RH Attention Goal: LTG Patient will demonstrate this level of attention during functional activites (SLP) Description: LTG:  Patient will will demonstrate this level of attention during functional activites (SLP) Flowsheets (Taken 03/02/2019 1151) LTG: Patient will demonstrate this level of attention during cognitive/linguistic activities with assistance of (SLP): Total Assistance - Patient < 25% Number of minutes patient will demonstrate attention during cognitive/linguistic activities: 3 minutes Note: Pt with decline in ability level and is currently dependent   Problem: RH Awareness Goal: LTG: Patient will demonstrate awareness during functional activites type of (SLP) Description: LTG: Patient will demonstrate awareness during functional activites type of (SLP) Outcome: Not Applicable Note: No longer appropriate d/t severity of cognitive deficits   Problem: RH Pre-functional/Other (Specify) Goal: RH LTG SLP (Specify) 1 Description: RH LTG SLP (Specify) 1 Outcome: Not Applicable Note: Discontinued d/t severity of impairments

## 2019-03-02 NOTE — Progress Notes (Signed)
Social Work Patient ID: Renee Fritz, female   DOB: 09/17/1925, 85 y.o.   MRN: [id number]  Met with daughter who is here and voiced her Mom is declining and has been since last Sunday when she was better in ICU. She wondered what the process is worker feels a palliative consult needs to be made and goals of care and discussion with family. Daughter is in agreement with this plan. Her overall goal is home with 24 hr care where she is with her family and beloved cat. Spoke with Pam-PA and MD aware of daughter's wish. Pam-PA has made palliative consult.  

## 2019-03-02 NOTE — Progress Notes (Signed)
Occupational Therapy Discharge Summary  Patient Details  Name: Renee Fritz MRN: [id number] Date of Birth: [birth year]  Today's Date: 03/02/2019 OT Individual Time: 9357-0177 OT Individual Time Calculation (min): 14 min    Patient has met 0 of 13 long term goals. Patient to discharge at overall mod - total A level. Pt has had functional decline and will discharged home with hospice and 24/7 caregivers.  Reasons goals not met: functional decline and discharge to hospice care  Recommendation:  OT does not recommend any further follow up services.   Equipment: defer to hospice  Reasons for discharge: change in medical status and lack of progress toward goals  Patient/family agrees with progress made and goals achieved: Yes   OT Intervention: Pt supine in bed with pt's daughter present in the room. OT present to answer any questions in regards to safety and to provide support to caregiver. Pt requesting water while OT present and OT educated caregiver on head being at 30 degrees when providing liquids/food for safety. She verbalized and demonstrated understanding. Caregiver with no futher concern at this time.   OT Discharge Precautions/Restrictions  Precautions Precaution Comments: L neglect, L visual deficit (neglect vs. field cut) Vital Signs Therapy Vitals Temp: 98.1 F (36.7 C) Temp Source: Oral Pulse Rate: 81 Resp: 16 BP: (!) 153/67 Patient Position (if appropriate): Lying Oxygen Therapy SpO2: 98 % O2 Device: Room Air Pain Pain Assessment Pain Scale: Faces Pain Score: 0-No pain Faces Pain Scale: No hurt Vision Visual Fields: Left visual field deficit Cognition Overall Cognitive Status: Impaired/Different from baseline Arousal/Alertness: Lethargic Orientation Level: Oriented to person Glass blower/designer - Discharge Observations: functional decline Mobility  Transfers Sit to Stand: Dependent - Patient 0%  Trunk/Postural Assessment  Cervical  Assessment Cervical Assessment: Exceptions to WFL(forward head) Thoracic Assessment Thoracic Assessment: Exceptions to WFL(rounded shoulders) Lumbar Assessment Lumbar Assessment: (posterior pelvic tilt) Postural Control Postural Control: Deficits on evaluation  Balance Static Sitting Balance Static Sitting - Level of Assistance: 3: Mod assist Dynamic Sitting Balance Dynamic Sitting - Balance Support: Feet supported Dynamic Sitting - Level of Assistance: 3: Mod assist Static Standing Balance Static Standing - Level of Assistance: 2: Max assist Extremity/Trunk Assessment RUE Assessment RUE Assessment: Within Functional Limits LUE Assessment LUE Assessment: Exceptions to [facility] General Strength Comments: Mild L hemi. Pt has full AROM, 4-/5 strength. Decreased coordination, slight ataxia, and inattention   Gypsy Decant 03/02/2019, 3:58 PM

## 2019-03-02 NOTE — Progress Notes (Signed)
Physical Therapy Session Note  Patient Details  Name: Renee Fritz MRN: [id number] Date of Birth: September 09, 1925  Today's Date: 03/02/2019 PT Individual Time: 1100-1140 PT Individual Time Calculation (min): 40 min   Short Term Goals: Week 1:  PT Short Term Goal 1 (Week 1): Pt will performed bed<>WC transfer with min assist and LRAD PT Short Term Goal 2 (Week 1): Pt will attend to L side 50% of the time to improve safety wihty functional mobility. PT Short Term Goal 3 (Week 1): Pt will ambulate 146ft with min assist and LRAD PT Short Term Goal 4 (Week 1): Pt will perform bed mobility with min assist consistently  Skilled Therapeutic Interventions/Progress Updates: Pt presented in bed with dgt present. Pt only orientated to self throughout session. Pt's dgt present throughout session. Per dgt PA has placed orders for palliative consult. Provided emotional support for dgt and explained after discussion with PA orders placed to decrease therapy (QD), dgt in agreement. Performed supine to sit at EOB modA x 1 with pt able to use UE to support self. Pt required minA for sitting balance as would lean to L in attempt to lay back down. Pt's dgt threaded pants total A while PTA guarded pt. Pt performed STS with mod/maxA to allow dgt to complete clothing management. Pt impulsively sat at EOB. With encouragement pt performed stand pivot transfer to recliner. Pt was able to take several small steps with RW to recliner with modA and max multimodal cues. Pt was able to scoot back in recliner once seated with supervision. Pt then intermittently following 1 step commands to perform ankle pumps and LAQ ultimately requiring PTA to perform AAROM with each activity. Pt then began falling asleep in recliner. Pt left in recliner with dgt present.      Therapy Documentation Precautions:  Precautions Precautions: Fall Precaution Comments: L neglect, L visual deficit (neglect vs. field cut) Restrictions Weight Bearing  Restrictions: No General:   Vital Signs:     Therapy/Group: Individual Therapy  Rheya Minogue  Dimples Probus, PTA  03/02/2019, 1:04 PM

## 2019-03-02 NOTE — Discharge Summary (Signed)
Physician Discharge Summary  Patient ID: Renee Fritz MRN: [id number] DOB/AGE: 10-Oct-1925 85 y.o.  Admit date: 02/24/2019 Discharge date: 03/03/2019  Discharge Diagnoses:  Active Problems:   IVH (intraventricular hemorrhage) (HCC)   Labile blood pressure   Sleep disturbance   Prediabetes   Hyponatremia   Hypokalemia   Acute lower UTI   Palliative care by specialist   DNR (do not resuscitate) Neurogenic bladder secondary to a fall 2007 Ulcerative colitis Hyperlipidemia  Discharged Condition: Stable  Significant Diagnostic Studies: Ct Angio Head W Or Wo Contrast  Result Date: 02/21/2019 CLINICAL DATA:  Cerebral hemorrhage. EXAM: CT ANGIOGRAPHY HEAD TECHNIQUE: Multidetector CT imaging of the head was performed using the standard protocol during bolus administration of intravenous contrast. Multiplanar CT image reconstructions and MIPs were obtained to evaluate the vascular anatomy. CONTRAST:  100mL OMNIPAQUE IOHEXOL 350 MG/ML SOLN COMPARISON:  None. FINDINGS: CTA HEAD Anterior circulation: No significant stenosis, proximal occlusion, aneurysm, or vascular malformation. Unchanged 3 x 4 cm RIGHT parietal hemorrhage. Posterior circulation: No significant stenosis, proximal occlusion, aneurysm, or vascular malformation. Venous sinuses: As permitted by contrast timing, patent. Anatomic variants: None of significance Delayed phase: Not performed. IMPRESSION: Unchanged 3 x 4 cm RIGHT parietal hemorrhage. This likely represents a lobar hypertensive bleed. There is no evidence of large vessel occlusion, saccular/mycotic aneurysm, visible venous thrombosis, or arteriovenous malformation contributing to the observed intracranial hemorrhage. Electronically Signed   By: Elsie StainJohn T Curnes M.D.   On: 02/21/2019 18:46   Dg Chest 1 View  Result Date: 02/21/2019 CLINICAL DATA:  Altered mental status EXAM: CHEST  1 VIEW COMPARISON:  06/07/2018 FINDINGS: No focal opacity or pleural effusion. Linear scarring in  the right lower lung. Stable cardiomediastinal silhouette. No pneumothorax. Old bilateral rib fractures. Old fracture deformity right clavicle IMPRESSION: No active disease. Electronically Signed   By: Jasmine PangKim  Fujinaga M.D.   On: 02/21/2019 18:57   Dg Thoracic Spine 2 View  Result Date: 02/21/2019 CLINICAL DATA:  Fall with back pain EXAM: THORACIC SPINE 2 VIEWS COMPARISON:  02/21/2019, 06/07/2018 FINDINGS: Chronic fracture deformity of the right mid clavicle. Old bilateral rib fractures. Kyphosis of the lower thoracic spine with treated compression deformities at T11 and T12. Mild compression deformity T4. Remaining vertebral bodies demonstrate normal stature. IMPRESSION: 1. Osseous demineralization limits the exam 2. Chronic compression deformity T4. Treated compression deformities T11-T12. No definite acute osseous abnormality Electronically Signed   By: Jasmine PangKim  Fujinaga M.D.   On: 02/21/2019 19:00   Dg Lumbar Spine Complete  Result Date: 02/21/2019 CLINICAL DATA:  Fall with back pain EXAM: LUMBAR SPINE - COMPLETE 4+ VIEW COMPARISON:  CT 09/04/2018 FINDINGS: Prior hernia repair. Clips in the right upper quadrant and pelvis. Excreted contrast within the renal collecting systems and urinary bladder. Lumbar alignment within normal limits. Treated compression fractures at T11 and T12. Lumbar vertebra demonstrate normal stature. Trace anterolisthesis L5 on S1, no change IMPRESSION: 1. No acute osseous abnormality 2. Excreted contrast within the renal collecting systems and bladder Electronically Signed   By: Jasmine PangKim  Fujinaga M.D.   On: 02/21/2019 19:03   Ct Head Wo Contrast  Result Date: 02/27/2019 CLINICAL DATA:  Intracranial hemorrhage follow-up. EXAM: CT HEAD WITHOUT CONTRAST TECHNIQUE: Contiguous axial images were obtained from the base of the skull through the vertex without intravenous contrast. COMPARISON:  MRI brain dated February 22, 2019. CT head dated February 21, 2019. FINDINGS: Brain: Grossly unchanged right  parietal hemorrhage measuring 3.0 x 4.1 x 4.6 cm, previously 3.3 x 4.1 x 4.6 cm.  Adjacent small volume subarachnoid hemorrhage has decreased. No new hemorrhage. No midline shift. No hydrocephalus or extra-axial collection. Stable atrophy and chronic microvascular ischemic changes. Vascular: Calcified atherosclerosis at the skullbase. No hyperdense vessel. Skull: Negative for fracture or focal lesion. Sinuses/Orbits: No acute finding. Other: None. IMPRESSION: 1. Grossly unchanged right parietal hemorrhage. Adjacent small volume subarachnoid hemorrhage has decreased. No new hemorrhage. Electronically Signed   By: Obie DredgeWilliam T Derry M.D.   On: 02/27/2019 14:36   Ct Head Wo Contrast  Result Date: 02/21/2019 CLINICAL DATA:  Confusion. Possible fall. EXAM: CT HEAD WITHOUT CONTRAST CT CERVICAL SPINE WITHOUT CONTRAST TECHNIQUE: Multidetector CT imaging of the head and cervical spine was performed following the standard protocol without intravenous contrast. Multiplanar CT image reconstructions of the cervical spine were also generated. COMPARISON:  02/14/2018. FINDINGS: CT HEAD FINDINGS Brain: There is an acute RIGHT parietal hemorrhage, 41 x 33 x 46 mm, mild surrounding edema, no RIGHT-to-LEFT shift or intraventricular extension. Slight subarachnoid blood. Elsewhere there is atrophy with chronic microvascular ischemic change. Vascular: Calcification of the cavernous internal carotid arteries consistent with cerebrovascular atherosclerotic disease. No signs of intracranial large vessel occlusion. Skull: No evidence of skull fracture. No significant scalp hematoma. Sinuses/Orbits: No acute findings. Other: None. CT CERVICAL SPINE FINDINGS Alignment: Straightening of the normal cervical lordosis. 4 mm anterolisthesis C4 on C5. Skull base and vertebrae: No acute fracture. No primary bone lesion or focal pathologic process. Soft tissues and spinal canal: No prevertebral soft tissue swelling. Atherosclerosis. Disc levels:   Multilevel spondylosis, worst at C4-5 and C5-6. Upper chest: Negative. Other: None IMPRESSION: 1. Acute RIGHT parietal hemorrhage, 41 x 33 x 46 mm, with mild surrounding edema but no RIGHT-to-LEFT shift or intraventricular extension. Slight subarachnoid blood. Approximate volume 34 mL. Favor hypertensive etiology. Imaging features are atypical for posttraumatic hematoma, and there is no evidence of skull fracture or significant scalp hematoma. 2. No cervical spine fracture or traumatic subluxation. Multilevel spondylosis. 3. Critical Value/emergent results were called by telephone at the time of interpretation on 02/21/2019 at 4:35 pm to Dr. Linwood DibblesJON KNAPP , who verbally acknowledged these results. Electronically Signed   By: Elsie StainJohn T Curnes M.D.   On: 02/21/2019 16:40   Ct Cervical Spine Wo Contrast  Result Date: 02/21/2019 CLINICAL DATA:  Confusion. Possible fall. EXAM: CT HEAD WITHOUT CONTRAST CT CERVICAL SPINE WITHOUT CONTRAST TECHNIQUE: Multidetector CT imaging of the head and cervical spine was performed following the standard protocol without intravenous contrast. Multiplanar CT image reconstructions of the cervical spine were also generated. COMPARISON:  02/14/2018. FINDINGS: CT HEAD FINDINGS Brain: There is an acute RIGHT parietal hemorrhage, 41 x 33 x 46 mm, mild surrounding edema, no RIGHT-to-LEFT shift or intraventricular extension. Slight subarachnoid blood. Elsewhere there is atrophy with chronic microvascular ischemic change. Vascular: Calcification of the cavernous internal carotid arteries consistent with cerebrovascular atherosclerotic disease. No signs of intracranial large vessel occlusion. Skull: No evidence of skull fracture. No significant scalp hematoma. Sinuses/Orbits: No acute findings. Other: None. CT CERVICAL SPINE FINDINGS Alignment: Straightening of the normal cervical lordosis. 4 mm anterolisthesis C4 on C5. Skull base and vertebrae: No acute fracture. No primary bone lesion or focal  pathologic process. Soft tissues and spinal canal: No prevertebral soft tissue swelling. Atherosclerosis. Disc levels:  Multilevel spondylosis, worst at C4-5 and C5-6. Upper chest: Negative. Other: None IMPRESSION: 1. Acute RIGHT parietal hemorrhage, 41 x 33 x 46 mm, with mild surrounding edema but no RIGHT-to-LEFT shift or intraventricular extension. Slight subarachnoid blood. Approximate volume 34 mL. Favor  hypertensive etiology. Imaging features are atypical for posttraumatic hematoma, and there is no evidence of skull fracture or significant scalp hematoma. 2. No cervical spine fracture or traumatic subluxation. Multilevel spondylosis. 3. Critical Value/emergent results were called by telephone at the time of interpretation on 02/21/2019 at 4:35 pm to Dr. Linwood DibblesJON KNAPP , who verbally acknowledged these results. Electronically Signed   By: Elsie StainJohn T Curnes M.D.   On: 02/21/2019 16:40   Mr Laqueta JeanBrain W FUWo Contrast  Result Date: 02/22/2019 CLINICAL DATA:  Intracranial hemorrhage.  Continued surveillance. EXAM: MRI HEAD WITHOUT AND WITH CONTRAST TECHNIQUE: Multiplanar, multiecho pulse sequences of the brain and surrounding structures were obtained without and with intravenous contrast. CONTRAST:  Gadavist 8 mL COMPARISON:  CT head 02/21/2019 FINDINGS: There is significant motion degradation. The study is of marginal diagnostic utility. Brain: Large RIGHT parietal hemorrhage redemonstrated, not clearly increased in size from priors. Surrounding edema. Peripheral restriction, likely susceptibility related although medial RIGHT parietal gyral restriction could represent acute ischemia; see series 3, image 39. Punctate focus of low level restriction RIGHT occipital lobe. Generalized atrophy. Extensive T2 and FLAIR hyperintensities in the white matter, likely small vessel disease. No definable abnormal postcontrast enhancement, when correlated with precontrast T1 weighted images, see for instance precontrast series 9, image 60  compared with postcontrast series 11, image 34. Vascular: Unable to assess.  Grossly negative. Skull and upper cervical spine: Grossly negative. Sinuses/Orbits: Grossly negative. Other: Grossly negative. IMPRESSION: Motion degraded study of marginal diagnostic utility demonstrates a large RIGHT parietal hemorrhage, not clearly increased in size from priors. No visible midline shift. Abnormal signal on diffusion-weighted imaging is largely related to susceptibility artifact, but there may be gyriform like acute infarction of the medial RIGHT parietal lobe, as well as a punctate focus of acute infarction in the RIGHT occipital lobe. See discussion above. There is no visible abnormal postcontrast enhancement to suggest underlying mass, when axial precontrast T1 weighted images are compared with those postcontrast. Consider repeat MRI brain in 6 weeks, when there has been partial involution of the hemorrhage, as well as hopefully improvement in the clinical status so that the patient might remain motionless. Electronically Signed   By: Elsie StainJohn T Curnes M.D.   On: 02/22/2019 17:43    Labs:  Basic Metabolic Panel: Recent Labs  Lab 02/25/19 0756 03/01/19 1825 03/02/19 0739  NA 134* 136 134*  K 3.6 2.6* 3.4*  CL 96* 94* 96*  CO2 25 27 27   GLUCOSE 104* 202* 163*  BUN 28* 24* 23  CREATININE 0.95 0.64 0.55  CALCIUM 8.8* 9.3 8.7*    CBC: Recent Labs  Lab 02/25/19 0756 03/01/19 1825 03/02/19 0739  WBC 10.5 11.0* 10.6*  NEUTROABS 7.5  --   --   HGB 14.2 14.6 13.2  HCT 42.3 43.1 39.5  MCV 89.1 88.7 89.2  PLT 321 357 359    CBG: No results for input(s): GLUCAP in the last 168 hours. Family history.  Unknown  Brief HPI:   Renee JewelsDorothy Getman is a 85 year old right-handed female with history of hypertension, ulcerative colitis, neurogenic bladder 2007 after a fall followed [facility] urology services [facility] [place]  Per chart review she lives [place].  She has a  home health aide 2 days a week.  Local family check on her as needed.  Presented 02/21/2019 with altered mental status.  Son had reported she recently stumbled and struck her left forehead into the wall.  Cranial CT scan showed acute  right parietal hemorrhage 41 x 33 x 46 mm with mild surrounding edema but no right to left shift or intraventricular extension.  Slight subarachnoid blood.  CT cervical spine negative.  CT angiogram of the head with no large vessel occlusion.  Follow-up MRI showed right parietal hemorrhage not increased in size from prior tracings.  No visible midline shift.  Neurology consulted advise conservative care.  Tolerating a regular diet.  Patient was admitted for a comprehensive rehab program.  Hospital Course: Clark Clowdus was admitted to rehab 02/24/2019 for inpatient therapies to consist of PT, ST and OT at least three hours five days a week. Past admission physiatrist, therapy team and rehab RN have worked together to provide customized collaborative inpatient rehab.  Pertaining to patient right parietal ICH traumatic versus hypertensive etiology.  Conservative care follow-up per neurology services.  Follow-up CT scan of the head 02/27/2019 showed grossly unchanged right parietal hemorrhage measuring 3.0 x 4.1 x 4.6 cm.  No midline shift no hydrocephalus.  Negative for fracture or focal lesion.  No new hemorrhage noted.  SCDs for DVT prophylaxis.  Mood stabilization with BuSpar 5 mg twice daily.  She had been on Seroquel discontinued at family's request.  Blood pressure monitored on HCTZ and Norvasc.  Neurogenic bladder secondary to a fall 2007 in and out catheterizations as discussed.  Patient with history of ulcerative colitis mesalamine 800 mg 3 times daily.  Hyperlipidemia Vytorin on hold secondary to intracerebral hemorrhage.  Sleep disturbance trial of melatonin.  Mild hyponatremia 134.  Decreased nutritional storage of dietary follow-up for food preferences.  IV fluids added for  hydration with latest sodium 134 potassium 3.4 patient did have a bout of hypokalemia 2.6 with supplement.  Urine culture 626 greater than 100,000 Klebsiella and maintained on Rocephin.  Physical exam.  Blood pressure 128/65 pulse 72 temperature 98 respirations 20 oxygen saturation 95% room air Constitutional.  No distress patient was a bit restless HEENT Head.  Normocephalic and atraumatic Eyes.  Pupils round and reactive to light conjunctiva normal no nystagmus Neck.  Supple nontender no thyromegaly without JVD Cardiovascular.  Normal rate and rhythm no friction rub or murmur heard Respiratory.  Effort normal no respiratory distress no wheezes no rails GI.  Soft exhibits no distention nontender without rebound Musculoskeletal.  General.  No edema Neurological.  Alert right gaze preference she was a bit hard of hearing right greater than left.  Can be cued to attend briefly to the left.  Poor attention and concentration.  Speech was a bit garbled but intelligible.  Right upper and right lower extremity 4 out of 5, left upper extremity 2+ out of 5, left lower extremity 3 out of 5.  Rehab course: During patient's stay in rehab weekly team conferences were held to monitor patient's progress, set goals and discuss barriers to discharge. At admission, patient required moderate assist to ambulate 80 feet rolling walker, moderate assist stand pivot transfers, moderate to max assist supine to sit.  Max assist upper body bathing max assist lower body bathing max assist upper body dressing max assist lower body dressing moderate assist toilet transfers  She  has had improvement in activity tolerance, balance, postural control as well as ability to compensate for deficits. She has had improvement in functional use RUE/LUE  and RLE/LLE as well as improvement in awareness.  Perform supine to sit edge of bed moderate assist x1 with patient able to use upper extremity to support her self.  Required minimal assist  for  sitting balance.  Patient's daughter would help to thread pants total assist.  Patient perform sit to stand with mod max assist to allow daughter to complete clothing management.  Patient was a bit impulsive the set at edge of bed.  With encouragement patient perform stand pivot transfers to recline.  Patient able to take several small steps with rolling walker to recliner with moderate assist and max multimodal cues.  Patient did need frequent rest breaks.  Palliative care was consulted to address goals of care established with family.  Full family teaching was completed and plan was for discharge to home 03/03/2019 with plans for [facility] to follow.  Plan was for Foley catheter prior to discharge.  Patient was to have 24-hour care at discharge.       Disposition: Discharge to home   Diet: Regular  Special Instructions: Medications establish with [facility] hospice care at discharge  Discharge Instructions    Ambulatory referral to Cardiology   Complete by: As directed    Placement of 30-day event monitor   Ambulatory referral to Neurology   Complete by: As directed    An appointment is requested in approximately: 4 to 6 weeks IVH      Follow-up Information    Kirsteins, Victorino Sparrow, MD Follow up.   Specialty: Physical Medicine and Rehabilitation Why: Office to call for appointment Contact information: [address] [place] [address] [phone]           Signed: Charlton Amor 03/02/2019, 4:24 PM

## 2019-03-02 NOTE — Plan of Care (Signed)
0/13 LTGs not met secondary to pt's functional decline and discharging to hospice care

## 2019-03-03 ENCOUNTER — Inpatient Hospital Stay (HOSPITAL_COMMUNITY): Payer: Medicare Other | Admitting: Speech Pathology

## 2019-03-03 MED ORDER — CEPHALEXIN 250 MG PO CAPS: 250.0000 mg | ORAL_CAPSULE | Freq: Three times a day (TID) | ORAL | Status: DC

## 2019-03-03 MED ORDER — AMLODIPINE BESYLATE 5 MG PO TABS: 5.0000 mg | ORAL_TABLET | Freq: Every day | ORAL | Status: AC

## 2019-03-03 MED ORDER — FOSFOMYCIN TROMETHAMINE 3 G PO PACK
3.0000 g | PACK | Freq: Once | ORAL | Status: DC
  Filled 2019-03-03: qty 3

## 2019-03-03 MED ORDER — SODIUM CHLORIDE 0.9 % IV SOLN: 1.0000 g | INTRAVENOUS | Status: DC

## 2019-03-03 MED ORDER — AMOXICILLIN-POT CLAVULANATE 500-125 MG PO TABS
1.0000 | ORAL_TABLET | Freq: Two times a day (BID) | ORAL | Status: DC
  Filled 2019-03-03: qty 1

## 2019-03-03 MED ORDER — CIPROFLOXACIN HCL 250 MG PO TABS: 250.0000 mg | ORAL_TABLET | Freq: Two times a day (BID) | ORAL | Status: DC

## 2019-03-03 MED ORDER — MELATONIN 3 MG PO TABS: 1.5000 mg | ORAL_TABLET | Freq: Every day | ORAL | 0 refills | Status: AC

## 2019-03-03 MED ORDER — BUSPIRONE HCL 5 MG PO TABS: 5.0000 mg | ORAL_TABLET | Freq: Two times a day (BID) | ORAL | Status: AC | PRN

## 2019-03-03 MED ORDER — ENSURE ENLIVE PO LIQD: 237.0000 mL | Freq: Two times a day (BID) | ORAL | 12 refills | Status: AC

## 2019-03-03 MED ORDER — CEFDINIR 300 MG PO CAPS: 300.0000 mg | ORAL_CAPSULE | Freq: Two times a day (BID) | ORAL | Status: DC

## 2019-03-03 MED ORDER — AMOXICILLIN-POT CLAVULANATE 500-125 MG PO TABS: 1.0000 | ORAL_TABLET | Freq: Two times a day (BID) | ORAL | 0 refills | Status: DC

## 2019-03-03 MED ORDER — SENNOSIDES-DOCUSATE SODIUM 8.6-50 MG PO TABS: 1.0000 | ORAL_TABLET | Freq: Two times a day (BID) | ORAL | 0 refills | Status: AC

## 2019-03-03 MED ORDER — FOSFOMYCIN TROMETHAMINE 3 G PO PACK: 3.0000 g | PACK | Freq: Once | ORAL | 0 refills | Status: AC

## 2019-03-03 MED ORDER — CEPHALEXIN 250 MG PO CAPS: 250.0000 mg | ORAL_CAPSULE | Freq: Three times a day (TID) | ORAL | 0 refills | Status: DC

## 2019-03-03 MED ORDER — METOPROLOL SUCCINATE ER 50 MG PO TB24: 50.0000 mg | ORAL_TABLET | Freq: Every day | ORAL | 1 refills | Status: AC

## 2019-03-03 NOTE — Discharge Summary (Addendum)
Addendum to discharge summary:    Medications at discharge:   Allergies as of 03/03/2019      Reactions   Estrace [estradiol] Other (See Comments)   burning   Pepcid [famotidine] Other (See Comments)   confusion   Phenergan [promethazine Hcl] Other (See Comments)   Feels like bugs crawling all over   Tramadol    Vancomycin Diarrhea, Other (See Comments)   Stomach pains   Erythromycin-sulfisoxazole Rash   Sulfa Antibiotics Rash      Medication List    STOP taking these medications   atenolol 100 MG tablet Commonly known as: TENORMIN   CALCIUM 1200+D3 PO   cetirizine 10 MG tablet Commonly known as: ZYRTEC   CoQ10 30 MG Caps   denosumab 60 MG/ML Sosy injection Commonly known as: PROLIA   Folbee Plus CZ 5 MG Tabs   nortriptyline 10 MG capsule Commonly known as: PAMELOR   Omega-3 1000 MG Caps   ondansetron 8 MG disintegrating tablet Commonly known as: ZOFRAN-ODT   potassium chloride 10 MEQ tablet Commonly known as: K-DUR   psyllium 58.6 % packet Commonly known as: METAMUCIL     TAKE these medications   amLODipine 5 MG tablet Commonly known as: NORVASC Take 1 tablet (5 mg total) by mouth daily.   Asacol HD 800 MG Tbec Generic drug: Mesalamine Take 800 mg by mouth 3 (three) times daily.   busPIRone 5 MG tablet Commonly known as: BUSPAR Take 1 tablet (5 mg total) by mouth 2 (two) times daily as needed (anxiety).   feeding supplement (ENSURE ENLIVE) Liqd Take 237 mLs by mouth 2 (two) times daily between meals.   fosfomycin 3 g Pack Commonly known as: MONUROL Take 3 g by mouth once for 1 dose.   hydrocortisone 25 MG suppository Commonly known as: ANUSOL-HC Place 25 mg rectally 2 (two) times daily as needed for hemorrhoids or anal itching.   Melatonin 3 MG Tabs Take 0.5 tablets (1.5 mg total) by mouth at bedtime.   metoprolol succinate 50 MG 24 hr tablet Commonly known as: TOPROL-XL Take 1 tablet (50 mg total) by mouth daily. Take with or  immediately following a meal. Start taking on: March 04, 2019   RABEprazole 20 MG tablet Commonly known as: ACIPHEX Take 20 mg by mouth daily.   senna-docusate 8.6-50 MG tablet Commonly known as: Senokot-S Take 1 tablet by mouth 2 (two) times daily.

## 2019-03-03 NOTE — Progress Notes (Signed)
[facility] PHYSICAL MEDICINE & REHABILITATION PROGRESS NOTE   Subjective/Complaints: Patient seen laying in bed this morning.  She remains confused.  Discussed with Child psychotherapistsocial worker and PA plans for home with hospice today.  ROS: Limited due to cognition   Objective:   No results found. Recent Labs    03/01/19 1825 03/02/19 0739  WBC 11.0* 10.6*  HGB 14.6 13.2  HCT 43.1 39.5  PLT 357 359   Recent Labs    03/01/19 1825 03/02/19 0739  NA 136 134*  K 2.6* 3.4*  CL 94* 96*  CO2 27 27  GLUCOSE 202* 163*  BUN 24* 23  CREATININE 0.64 0.55  CALCIUM 9.3 8.7*    Intake/Output Summary (Last 24 hours) at 03/03/2019 1103 Last data filed at 03/03/2019 0605 Gross per 24 hour  Intake 0 ml  Output 1500 ml  Net -1500 ml     Physical Exam: Vital Signs Blood pressure 117/88, pulse 89, temperature 97.7 F (36.5 C), temperature source Oral, resp. rate 17, weight 44 kg, SpO2 97 %. Constitutional: No distress . Vital signs reviewed. HENT: Normocephalic.  Atraumatic. Eyes: Keeps eyes closed.  No discharge. Cardiovascular: No JVD. Respiratory: Normal effort. GI: Non-distended. Musc: No edema or tenderness in extremities. Neurologic: Alert HOH  Motor: Limited due to participation, but seen spontaneously moving extremities, unchanged.  Skin: Warm and dry.  Intact. Psych: Unable to assess due to cognition  Assessment/Plan: 1. 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  Physiatrist is providing close team supervision and 24 hour management of active medical problems listed below.  Physiatrist and rehab team continue to assess barriers to discharge/monitor patient progress toward functional and medical goals  Care Tool:  Bathing  Bathing activity did not occur: Refused     Body parts bathed by helper: Right arm, Left arm, Chest, Abdomen, Front perineal area, Buttocks,  Right upper leg, Left upper leg, Right lower leg, Left lower leg, Face     Bathing assist Assist Level: Total Assistance - Patient < 25%     Upper Body Dressing/Undressing Upper body dressing Upper body dressing/undressing activity did not occur (including orthotics): Refused What is the patient wearing?: Pull over shirt    Upper body assist Assist Level: Dependent - Patient 0%    Lower Body Dressing/Undressing Lower body dressing    Lower body dressing activity did not occur: Refused What is the patient wearing?: Pants     Lower body assist Assist for lower body dressing: Dependent - Patient 0%     Toileting Toileting Toileting Activity did not occur Press photographer(Clothing management and hygiene only): Refused  Toileting assist Assist for toileting: Dependent - Patient 0%     Transfers Chair/bed transfer  Transfers assist     Chair/bed transfer assist level: Moderate Assistance - Patient 50 - 74%     Locomotion Ambulation   Ambulation assist   Ambulation activity did not occur: Safety/medical concerns  Assist level: Minimal Assistance - Patient > 75% Assistive device: Walker-rolling Max distance: 150   Walk 10 feet activity   Assist  Walk 10 feet activity did not occur: Safety/medical concerns  Assist level: Minimal Assistance - Patient > 75% Assistive device: Walker-rolling   Walk 50 feet activity   Assist Walk 50 feet with 2 turns activity did not occur: Safety/medical concerns  Assist level: Minimal Assistance - Patient > 75% Assistive device: Walker-rolling    Walk 150 feet activity  Assist Walk 150 feet activity did not occur: Safety/medical concerns  Assist level: Minimal Assistance - Patient > 75% Assistive device: Walker-rolling    Walk 10 feet on uneven surface  activity   Assist Walk 10 feet on uneven surfaces activity did not occur: Safety/medical concerns         Wheelchair     Assist Will patient use wheelchair at  discharge?: Yes Type of Wheelchair: Manual Wheelchair activity did not occur: Safety/medical concerns  Wheelchair assist level: Moderate Assistance - Patient 50 - 74% Max wheelchair distance: 38'    Wheelchair 50 feet with 2 turns activity    Assist    Wheelchair 50 feet with 2 turns activity did not occur: Safety/medical concerns       Wheelchair 150 feet activity     Assist Wheelchair 150 feet activity did not occur: Safety/medical concerns        Medical Problem List and Plan: 1.Decreased functional mobilitysecondary to likelytraumaticright parietalICH(versus hypertensive etiology)  Discharge home with hospice today  Appreciate palliative care 2. Antithrombotics: -DVT/anticoagulation:SCDs -antiplatelet therapy: N/A 3. Pain Management:Tylenol as needed 4. Mood/mental status:BuSpar 5 mg twice daily, (Pamelor 10 mg nightly stopped) Antipsychotic agents: seroquel dc'ed  continue sleep chart  Re-establish normal day-night cycle 5. Neuropsych: This patientis notcapable of making decisions on herown behalf. 6. Skin/Wound Care:Routine skin checks 7. Fluids/Electrolytes/Nutrition:Routine in and outs 8. Hypertension. HCTZ 25 mg daily, Norvasc 5 mg daily. Monitor with increased mobility Vitals:   03/02/19 1937 03/03/19 0417  BP: 121/84 117/88  Pulse: 86 89  Resp: 17 17  Temp: 98.7 F (37.1 C) 97.7 F (36.5 C)  SpO2: 100% 97%   Toprolol 50 daily  Relatively controlled on 6/30 9.Neurogenic bladder secondary to a fall 2007. Independent self caths. Followed by Dr. Venia Minks [facility] urology -continue I/O caths  10. Ulcerative colitis. Mesalamine 800 mg 3 times daily 11. Hyperlipidemia. Vytorin currently on hold secondary to intracerebral hemorrhage  12. Prerenal azotemia: Poor intake   -continue IVF at noc            13.  Sleep disturbance  See #4  Melatonin started on 6/29 14.  Prediabetes  Labile  on 6/29 15.  Hyponatremia  Sodium 134 on 6/29  Continue to monitor 16.  Hypokalemia  Potassium 3.4 on 6/29  Supplemented with IVF  Continue monitor 17.  Acute lower UTI  WBCs 10.6 on 6/29  UA +, UCX with greater than 100,000 Klebsiella pneumonia  Will transition from ceftriaxone to Keflex  LOS: 7 days A FACE TO FACE EVALUATION WAS PERFORMED  Macauley Mossberg [name] 03/03/2019, 11:03 AM

## 2019-03-03 NOTE — Progress Notes (Signed)
Social Work Discharge Note   The overall goal for the admission was met for:   Discharge location: [facility]  Length of Stay: Yes-7 DAYS  Discharge activity level: NO-MOD/MAX LEVEL  Home/community participation: Yes  Services provided included: MD, RD, PT, OT, SLP, RN, CM, Pharmacy and [place]: Medicare and Private Insurance: TRICARE  Follow-up services arranged: Other: [facility]  Comments (or additional information):[PT DECLINED AND IS APPROPRIATE FOR HOSPICE SERVICES. FAMILY IS FOLLOWING PT'S WISHES AND TAKING HER HOME WHERE SHE WANTS TO BE.  Patient/Family verbalized understanding of follow-up arrangements: Yes  Individual responsible for coordination of the follow-up plan: CAROL-DAUGHTER  Confirmed correct DME delivered: [name] 03/03/2019    [name]

## 2019-03-03 NOTE — Discharge Instructions (Signed)
Inpatient Rehab Discharge Instructions  Renee JewelsDorothy Fritz Discharge date and time: 03/03/19    Activities/Precautions/ Functional Status: Activity: activity as tolerated Diet: regular diet Wound Care: none needed    Functional status:  ___ No restrictions     ___ Walk up steps independently _X__ 24/7 supervision/assistance   ___ Walk up steps with assistance ___ Intermittent supervision/assistance  ___ Bathe/dress independently ___ Walk with walker     _X__ Bathe/dress with assistance ___ Walk Independently    ___ Shower independently ___ Walk with assistance    ___ Shower with assistance ___ No alcohol     ___ Return to work/school ________  Special Instructions: 1. Perform foley care twice a day and if soiled.   Indwelling Urinary Catheter Care, Adult An indwelling urinary catheter is a thin tube that is put into your bladder. The tube helps to drain pee (urine) out of your body. The tube goes in through your urethra. Your urethra is where pee comes out of your body. Your pee will come out through the catheter, then it will go into a bag (drainage bag). Take good care of your catheter so it will work well. How to wear your catheter and bag Supplies needed  Sticky tape (adhesive tape) or a leg strap.  Alcohol wipe or soap and water (if you use tape).  A clean towel (if you use tape).  Large overnight bag.  Smaller bag (leg bag). Wearing your catheter Attach your catheter to your leg with tape or a leg strap.  Make sure the catheter is not pulled tight.  If a leg strap gets wet, take it off and put on a dry strap.  If you use tape to hold the bag on your leg: 1. Use an alcohol wipe or soap and water to wash your skin where the tape made it sticky before. 2. Use a clean towel to pat-dry that skin. 3. Use new tape to make the bag stay on your leg. Wearing your bags You should have been given a large overnight bag.  You may wear the overnight bag in the day or  night.  Always have the overnight bag lower than your bladder.  Do not let the bag touch the floor.  Before you go to sleep, put a clean plastic bag in a wastebasket. Then hang the overnight bag inside the wastebasket. You should also have a smaller leg bag that fits under your clothes.  Always wear the leg bag below your knee.  Do not wear your leg bag at night. How to care for your skin and catheter Supplies needed  A clean washcloth.  Water and mild soap.  A clean towel. Caring for your skin and catheter      Clean the skin around your catheter every day: ? Wash your hands with soap and water. ? Wet a clean washcloth in warm water and mild soap. ? Clean the skin around your urethra. ? If you are female: ? Gently spread the folds of skin around your vagina (labia). ? With the washcloth in your other hand, wipe the inner side of your labia on each side. Wipe from front to back. ? If you are female: ? Pull back any skin that covers the end of your penis (foreskin). ? With the washcloth in your other hand, wipe your penis in small circles. Start wiping at the tip of your penis, then move away from the catheter. ? Move the foreskin back in place, if needed. ? With your  free hand, hold the catheter close to where it goes into your body. ? Keep holding the catheter during cleaning so it does not get pulled out. ? With the washcloth in your other hand, clean the catheter. ? Only wipe downward on the catheter. ? Do not wipe upward toward your body. Doing this may push germs into your urethra and cause infection. ? Use a clean towel to pat-dry the catheter and the skin around it. Make sure to wipe off all soap. ? Wash your hands with soap and water.  Shower every day. Do not take baths.  Do not use cream, ointment, or lotion on the area where the catheter goes into your body, unless your doctor tells you to.  Do not use powders, sprays, or lotions on your genital area.  Check  your skin around the catheter every day for signs of infection. Check for: ? Redness, swelling, or pain. ? Fluid or blood. ? Warmth. ? Pus or a bad smell. How to empty the bag Supplies needed  Rubbing alcohol.  Gauze pad or cotton ball.  Tape or a leg strap. Emptying the bag Pour the pee out of your bag when it is ?- full, or at least 2-3 times a day. Do this for your overnight bag and your leg bag. 1. Wash your hands with soap and water. 2. Separate (detach) the bag from your leg. 3. Hold the bag over the toilet or a clean pail. Keep the bag lower than your hips and bladder. This is so the pee (urine) does not go back into the tube. 4. Open the pour spout. It is at the bottom of the bag. 5. Empty the pee into the toilet or pail. Do not let the pour spout touch any surface. 6. Put rubbing alcohol on a gauze pad or cotton ball. 7. Use the gauze pad or cotton ball to clean the pour spout. 8. Close the pour spout. 9. Attach the bag to your leg with tape or a leg strap. 10. Wash your hands with soap and water. Follow instructions for cleaning the drainage bag:  From the product maker.  As told by your doctor. How to change the bag Supplies needed  Alcohol wipes.  A clean bag.  Tape or a leg strap. Changing the bag Replace your bag when it starts to leak, smell bad, or look dirty. 1. Wash your hands with soap and water. 2. Separate the dirty bag from your leg. 3. Pinch the catheter with your fingers so that pee does not spill out. 4. Separate the catheter tube from the bag tube where these tubes connect (at the connection valve). Do not let the tubes touch any surface. 5. Clean the end of the catheter tube with an alcohol wipe. Use a different alcohol wipe to clean the end of the bag tube. 6. Connect the catheter tube to the tube of the clean bag. 7. Attach the clean bag to your leg with tape or a leg strap. Do not make the bag tight on your leg. 8. Wash your hands with  soap and water. General rules   Never pull on your catheter. Never try to take it out. Doing that can hurt you.  Always wash your hands before and after you touch your catheter or bag. Use a mild, fragrance-free soap. If you do not have soap and water, use hand sanitizer.  Always make sure there are no twists or bends (kinks) in the catheter tube.  Always make  sure there are no leaks in the catheter or bag.  Drink enough fluid to keep your pee pale yellow.  Do not take baths, swim, or use a hot tub.  If you are female, wipe from front to back after you poop (have a bowel movement). Contact a doctor if:  Your pee is cloudy.  Your pee smells worse than usual.  Your catheter gets clogged.  Your catheter leaks.  Your bladder feels full. Get help right away if:  You have redness, swelling, or pain where the catheter goes into your body.  You have fluid, blood, pus, or a bad smell coming from the area where the catheter goes into your body.  Your skin feels warm where the catheter goes into your body.  You have a fever.  You have pain in your: ? Belly (abdomen). ? Legs. ? Lower back. ? Bladder.  You see blood in the catheter.  Your pee is pink or red.  You feel sick to your stomach (nauseous).  You throw up (vomit).  You have chills.  Your pee is not draining into the bag.  Your catheter gets pulled out. Summary  An indwelling urinary catheter is a thin tube that is placed into the bladder to help drain pee (urine) out of the body.  The catheter is placed into the part of the body that drains pee from the bladder (urethra).  Taking good care of your catheter will keep it working properly and help prevent problems.  Always wash your hands before and after touching your catheter or bag.  Never pull on your catheter or try to take it out. This information is not intended to replace advice given to you by your health care provider. Make sure you discuss any  questions you have with your health care provider. Document Released: 12/15/2012 Document Revised: 12/12/2018 Document Reviewed: 04/05/2017 Elsevier Patient Education  2020 [employer]    My questions have been answered and I understand these instructions. I will adhere to these goals and the provided educational materials after my discharge from the hospital.  Patient/Caregiver Signature _______________________________ Date __________  Clinician Signature _______________________________________ Date __________  Please bring this form and your medication list with you to all your follow-up doctor's appointments.

## 2019-03-04 NOTE — Progress Notes (Signed)
Speech Language Pathology Discharge Summary  Patient Details  Name: Renee Fritz MRN: [id number] Date of Birth: 01/14/1926     Patient has met 2 of 5 long term goals.  Patient to discharge at overall   level.    Reasons goals not met:   While at [facility] pt with continued medical and cognitive decline that prevented her from meeting all LTGs. Given pt's decline, she is appropriate for Hospice services.    Reasons for discharge:   Pt is appropriate for Hospice services. All education completed with pt's daughter. All questions answered ot daughter's satisfaction.        Teal Bontrager 03/04/2019, 6:49 AM

## 2019-09-04 DEATH — deceased

## 2019-12-24 IMAGING — MR MRI HEAD WITHOUT AND WITH CONTRAST
9 of 13 series · 33 of 48 positions shown · IV contrast (gadavist)
Comparison: CT head 02/21/2019

CLINICAL DATA: Intracranial hemorrhage.  Continued surveillance.

EXAM:
MRI HEAD WITHOUT AND WITH CONTRAST
TECHNIQUE: Multiplanar, multiecho pulse sequences of the brain and surrounding
structures were obtained without and with intravenous contrast.
CONTRAST:  Gadavist 8 mL

[Series 3: DWI · axial · 3.0mm · 1.09mm/px · z∈[-67,+106]mm · 8 of 118 slices shown (1 of 4)]
[im 1/118]
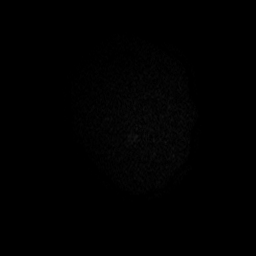
[im 17/118]
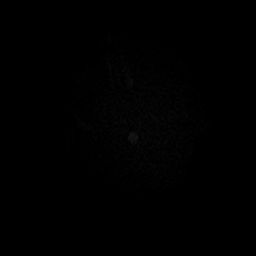
[im 34/118]
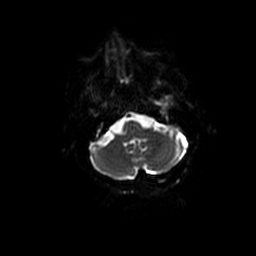
[im 51/118]
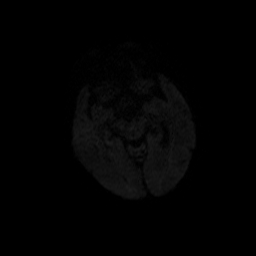
[im 67/118]
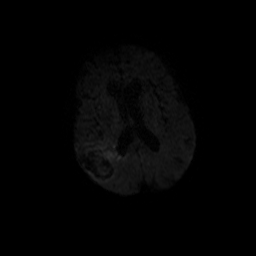
[im 84/118]
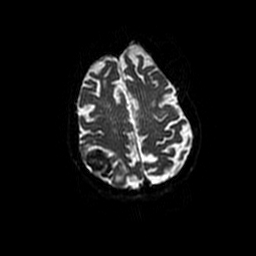
[im 101/118]
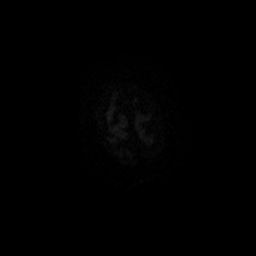
[im 118/118]
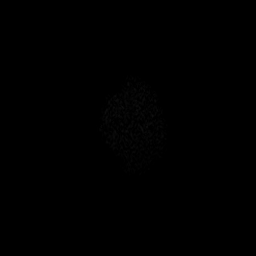

[Series 4: DWI · coronal · 5.0mm · 1.09mm/px · 6 of 82 slices shown (2 of 4)]
[im 1/82]
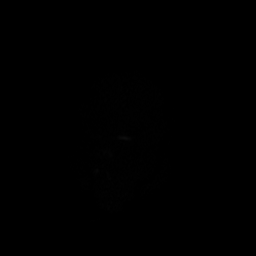
[im 17/82]
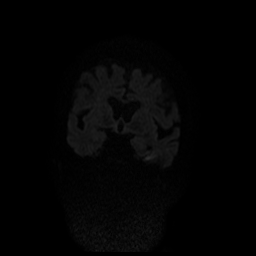
[im 33/82]
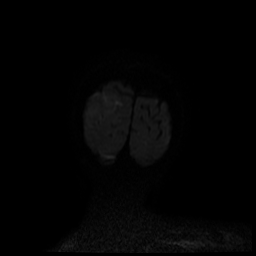
[im 49/82]
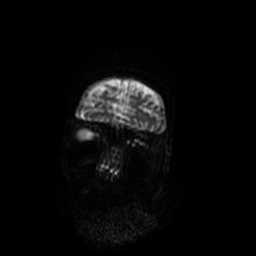
[im 65/82]
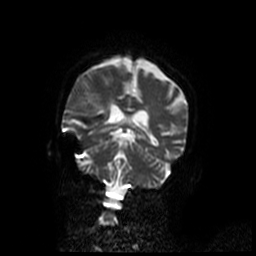
[im 82/82]
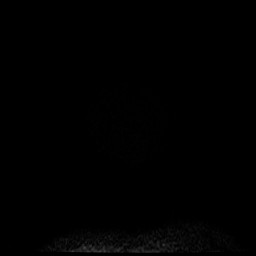

[Series 6: T2 · axial · 5.0mm · 0.43mm/px · z∈[-88,+90]mm · 2 of 31 slices shown]
[im 1/31]
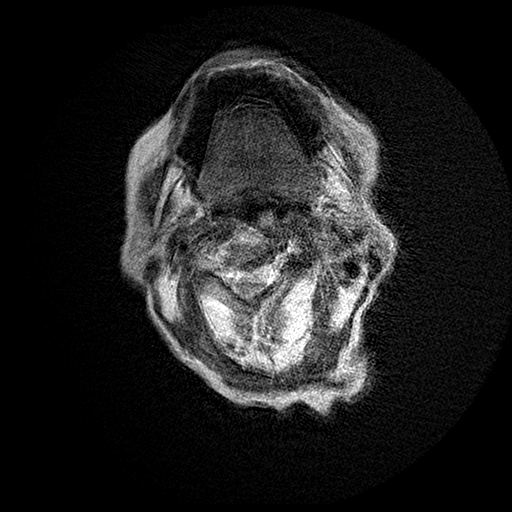
[im 31/31]
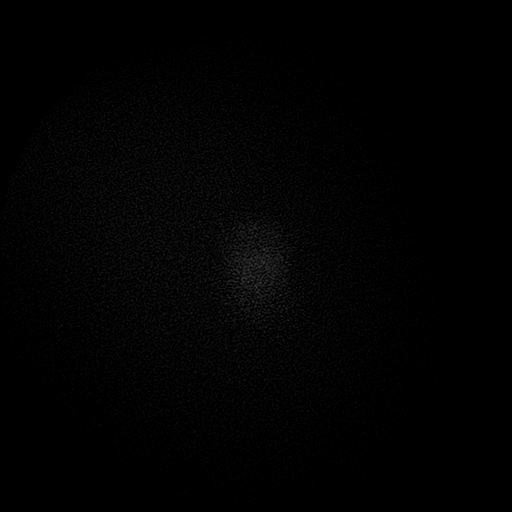

[Series 7: FLAIR · axial · 5.0mm · 0.43mm/px · z∈[-94,+86]mm · 2 of 31 slices shown]
[im 1/31]
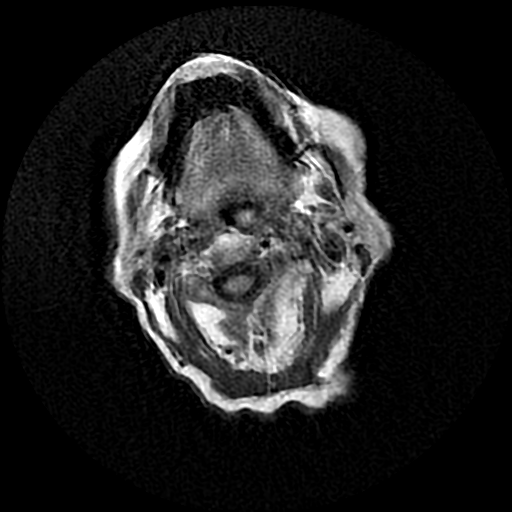
[im 31/31]
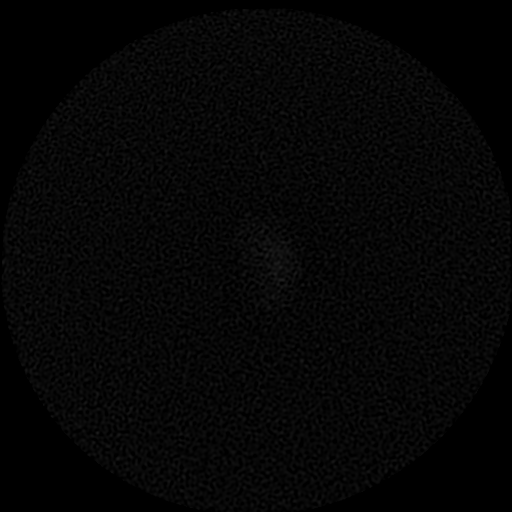

[Series 11: T1 post-contrast · axial · 3.0mm · 0.47mm/px · z∈[-100,+76]mm · 4 of 60 slices shown (1 of 3)]
[im 1/60]
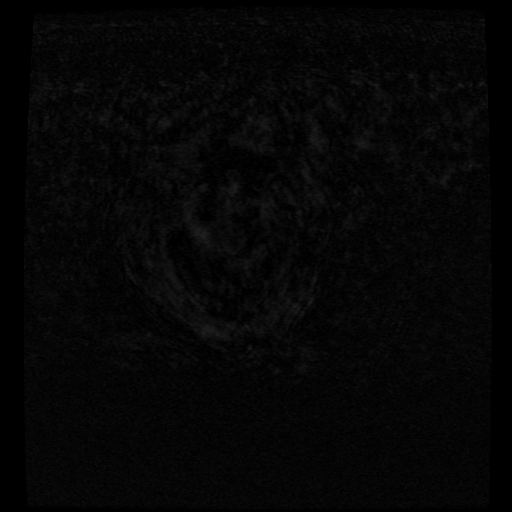
[im 20/60]
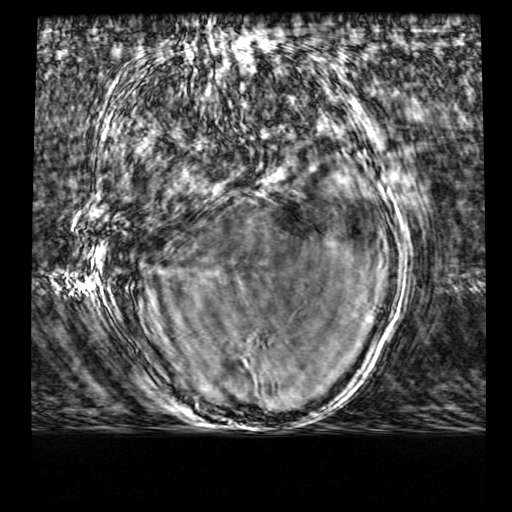
[im 40/60]
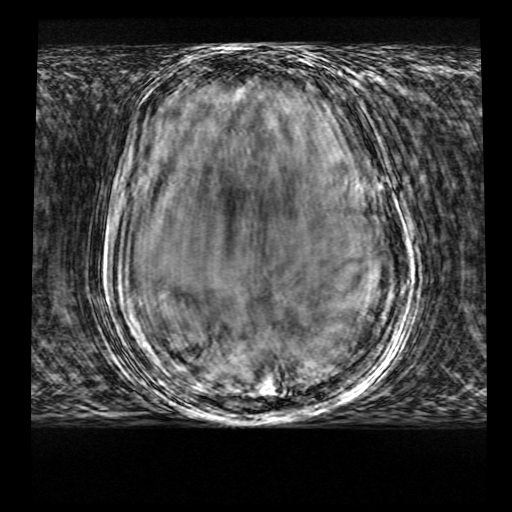
[im 60/60]
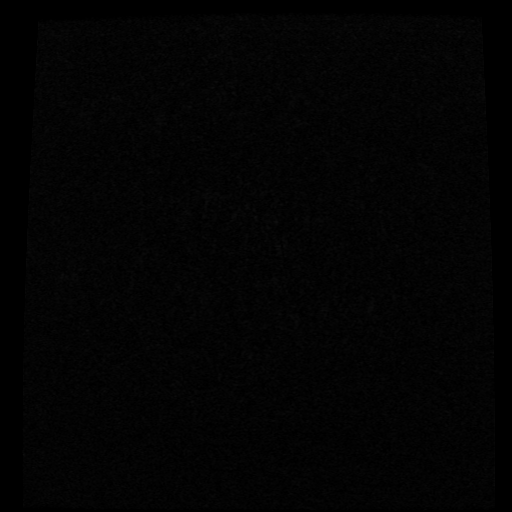

[Series 12: T1 post-contrast · coronal · 5.0mm · 0.39mm/px · 2 of 27 slices shown (2 of 3)]
[im 1/27]
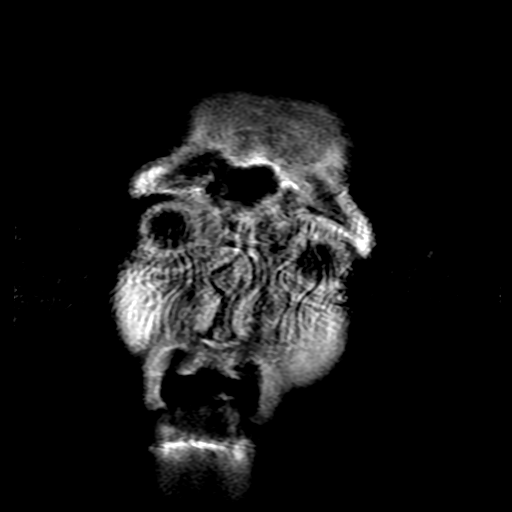
[im 27/27]
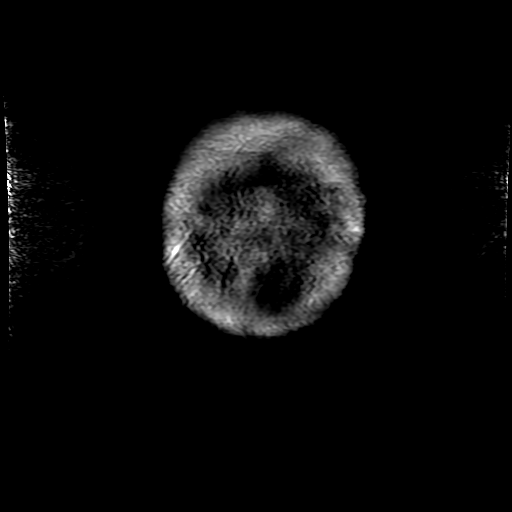

[Series 13: T1 post-contrast · sagittal · 5.0mm · 0.47mm/px · 2 of 27 slices shown (3 of 3)]
[im 1/27]
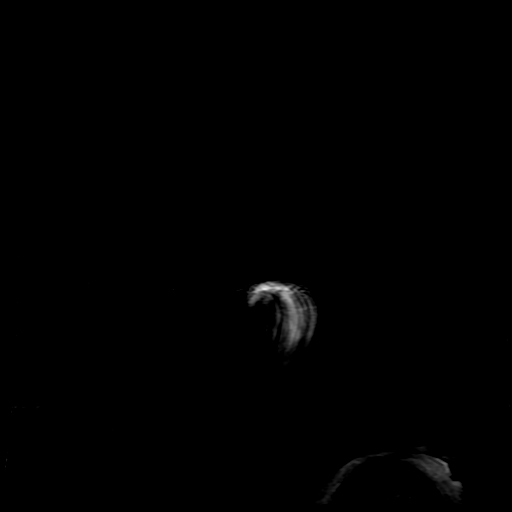
[im 27/27]
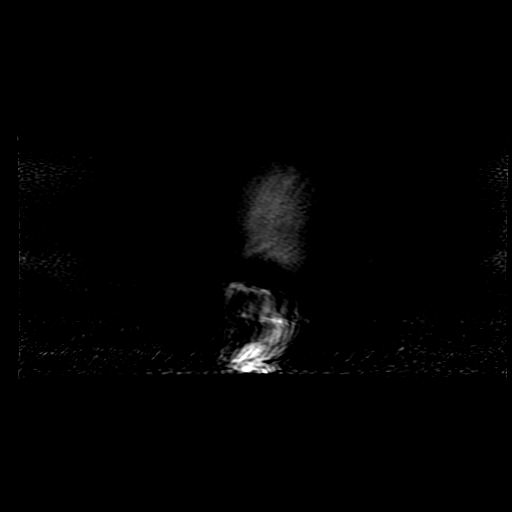

[Series 300: DWI · axial · 3.0mm · 1.09mm/px · z∈[-67,+106]mm · 4 of 58 slices shown (3 of 4)]
[im 1/58]
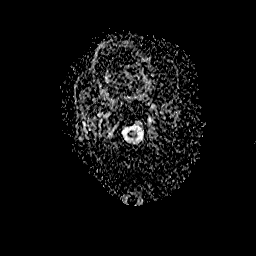
[im 20/58]
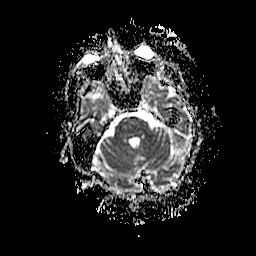
[im 39/58]
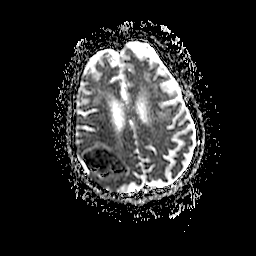
[im 58/58]
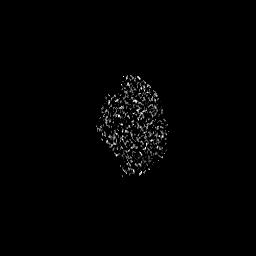

[Series 400: DWI · coronal · 5.0mm · 1.09mm/px · 3 of 40 slices shown (4 of 4)]
[im 1/40]
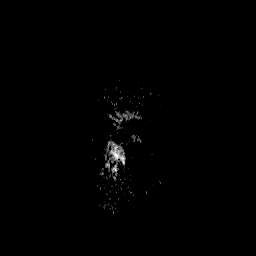
[im 20/40]
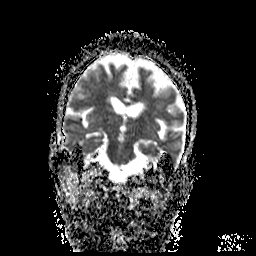
[im 40/40]
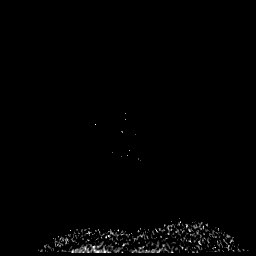

[33 of 48 positions shown; findings below may reference images not displayed]

FINDINGS: There is significant motion degradation. The study is of marginal
diagnostic utility.

Brain: Large RIGHT parietal hemorrhage redemonstrated, not clearly
increased in size from priors. Surrounding edema. Peripheral
restriction, likely susceptibility related although medial RIGHT
parietal gyral restriction could represent acute ischemia; see
series 3, image 39. Punctate focus of low level restriction RIGHT
occipital lobe.

Generalized atrophy. Extensive T2 and FLAIR hyperintensities in the
white matter, likely small vessel disease.

No definable abnormal postcontrast enhancement, when correlated with
precontrast T1 weighted images, see for instance precontrast series
9, image 60 compared with postcontrast series 11, image 34.

Vascular: Unable to assess.  Grossly negative.

Skull and upper cervical spine: Grossly negative.

Sinuses/Orbits: Grossly negative.

Other: Grossly negative.
IMPRESSION: Motion degraded study of marginal diagnostic utility demonstrates a
large RIGHT parietal hemorrhage, not clearly increased in size from
priors. No visible midline shift.

Abnormal signal on diffusion-weighted imaging is largely related to
susceptibility artifact, but there may be gyriform like acute
infarction of the medial RIGHT parietal lobe, as well as a punctate
focus of acute infarction in the RIGHT occipital lobe. See
discussion above.

There is no visible abnormal postcontrast enhancement to suggest
underlying mass, when axial precontrast T1 weighted images are
compared with those postcontrast.

Consider repeat MRI brain in 6 weeks, when there has been partial
involution of the hemorrhage, as well as hopefully improvement in
the clinical status so that the patient might remain motionless.
